# Patient Record
Sex: Male | Born: 1965
Health system: Southern US, Community
[De-identification: ages and names within clinical notes are randomized; demographics above are authoritative.]

## PROBLEM LIST (undated history)

## (undated) DIAGNOSIS — I255 Ischemic cardiomyopathy: Secondary | ICD-10-CM

## (undated) DIAGNOSIS — N529 Male erectile dysfunction, unspecified: Secondary | ICD-10-CM

## (undated) DIAGNOSIS — G629 Polyneuropathy, unspecified: Secondary | ICD-10-CM

## (undated) DIAGNOSIS — IMO0001 Reserved for inherently not codable concepts without codable children: Secondary | ICD-10-CM

## (undated) DIAGNOSIS — R519 Headache, unspecified: Secondary | ICD-10-CM

## (undated) DIAGNOSIS — I745 Embolism and thrombosis of iliac artery: Secondary | ICD-10-CM

## (undated) DIAGNOSIS — I219 Acute myocardial infarction, unspecified: Secondary | ICD-10-CM

## (undated) DIAGNOSIS — Z955 Presence of coronary angioplasty implant and graft: Secondary | ICD-10-CM

## (undated) DIAGNOSIS — E669 Obesity, unspecified: Secondary | ICD-10-CM

## (undated) DIAGNOSIS — R51 Headache: Secondary | ICD-10-CM

## (undated) DIAGNOSIS — E291 Testicular hypofunction: Secondary | ICD-10-CM

## (undated) DIAGNOSIS — E785 Hyperlipidemia, unspecified: Secondary | ICD-10-CM

## (undated) DIAGNOSIS — I739 Peripheral vascular disease, unspecified: Secondary | ICD-10-CM

## (undated) DIAGNOSIS — F17200 Nicotine dependence, unspecified, uncomplicated: Secondary | ICD-10-CM

## (undated) DIAGNOSIS — Z789 Other specified health status: Secondary | ICD-10-CM

## (undated) DIAGNOSIS — F329 Major depressive disorder, single episode, unspecified: Secondary | ICD-10-CM

## (undated) DIAGNOSIS — I701 Atherosclerosis of renal artery: Secondary | ICD-10-CM

## (undated) DIAGNOSIS — C61 Malignant neoplasm of prostate: Secondary | ICD-10-CM

## (undated) DIAGNOSIS — F32A Depression, unspecified: Secondary | ICD-10-CM

## (undated) DIAGNOSIS — I771 Stricture of artery: Secondary | ICD-10-CM

## (undated) DIAGNOSIS — K420 Umbilical hernia with obstruction, without gangrene: Secondary | ICD-10-CM

## (undated) DIAGNOSIS — I4891 Unspecified atrial fibrillation: Secondary | ICD-10-CM

## (undated) DIAGNOSIS — D72829 Elevated white blood cell count, unspecified: Secondary | ICD-10-CM

## (undated) DIAGNOSIS — F1290 Cannabis use, unspecified, uncomplicated: Secondary | ICD-10-CM

## (undated) DIAGNOSIS — N400 Enlarged prostate without lower urinary tract symptoms: Secondary | ICD-10-CM

## (undated) DIAGNOSIS — M545 Low back pain, unspecified: Secondary | ICD-10-CM

## (undated) DIAGNOSIS — Z7902 Long term (current) use of antithrombotics/antiplatelets: Secondary | ICD-10-CM

## (undated) DIAGNOSIS — R5383 Other fatigue: Secondary | ICD-10-CM

## (undated) DIAGNOSIS — I10 Essential (primary) hypertension: Secondary | ICD-10-CM

## (undated) DIAGNOSIS — A6000 Herpesviral infection of urogenital system, unspecified: Secondary | ICD-10-CM

## (undated) DIAGNOSIS — Z0282 Encounter for adoption services: Secondary | ICD-10-CM

## (undated) DIAGNOSIS — Z7901 Long term (current) use of anticoagulants: Secondary | ICD-10-CM

## (undated) DIAGNOSIS — K219 Gastro-esophageal reflux disease without esophagitis: Secondary | ICD-10-CM

## (undated) DIAGNOSIS — F419 Anxiety disorder, unspecified: Secondary | ICD-10-CM

## (undated) DIAGNOSIS — G8929 Other chronic pain: Secondary | ICD-10-CM

## (undated) DIAGNOSIS — G43909 Migraine, unspecified, not intractable, without status migrainosus: Secondary | ICD-10-CM

## (undated) DIAGNOSIS — I2510 Atherosclerotic heart disease of native coronary artery without angina pectoris: Secondary | ICD-10-CM

## (undated) DIAGNOSIS — R0602 Shortness of breath: Secondary | ICD-10-CM

## (undated) HISTORY — DX: Low back pain: M54.5

## (undated) HISTORY — PX: OTHER SURGICAL HISTORY: SHX169

## (undated) HISTORY — DX: Benign prostatic hyperplasia without lower urinary tract symptoms: N40.0

## (undated) HISTORY — DX: Malignant neoplasm of prostate: C61

## (undated) HISTORY — DX: Other fatigue: R53.83

## (undated) HISTORY — DX: Major depressive disorder, single episode, unspecified: F32.9

## (undated) HISTORY — DX: Unspecified atrial fibrillation: I48.91

## (undated) HISTORY — DX: Low back pain, unspecified: M54.50

## (undated) HISTORY — DX: Other chronic pain: G89.29

## (undated) HISTORY — DX: Headache: R51

## (undated) HISTORY — DX: Headache, unspecified: R51.9

## (undated) HISTORY — DX: Depression, unspecified: F32.A

## (undated) HISTORY — DX: Testicular hypofunction: E29.1

## (undated) HISTORY — DX: Atherosclerotic heart disease of native coronary artery without angina pectoris: I25.10

## (undated) HISTORY — DX: Herpesviral infection of urogenital system, unspecified: A60.00

## (undated) HISTORY — DX: Male erectile dysfunction, unspecified: N52.9

## (undated) HISTORY — DX: Encounter for adoption services: Z02.82

## (undated) HISTORY — DX: Hyperlipidemia, unspecified: E78.5

## (undated) HISTORY — DX: Anxiety disorder, unspecified: F41.9

## (undated) HISTORY — DX: Polyneuropathy, unspecified: G62.9

## (undated) HISTORY — DX: Obesity, unspecified: E66.9

## (undated) HISTORY — DX: Ischemic cardiomyopathy: I25.5

## (undated) HISTORY — DX: Shortness of breath: R06.02

## (undated) HISTORY — DX: Acute myocardial infarction, unspecified: I21.9

## (undated) HISTORY — DX: Other specified health status: Z78.9

## (undated) HISTORY — DX: Essential (primary) hypertension: I10

---

## 1983-10-14 HISTORY — PX: OTHER SURGICAL HISTORY: SHX169

## 1998-12-07 ENCOUNTER — Ambulatory Visit (HOSPITAL_COMMUNITY): Admission: RE | Admit: 1998-12-07 | Discharge: 1998-12-08 | Payer: Self-pay | Admitting: Neurosurgery

## 1998-12-07 ENCOUNTER — Encounter: Payer: Self-pay | Admitting: Neurosurgery

## 2005-02-17 ENCOUNTER — Ambulatory Visit: Payer: Self-pay | Admitting: Family Medicine

## 2006-02-26 ENCOUNTER — Ambulatory Visit: Payer: Self-pay | Admitting: Family Medicine

## 2006-11-13 ENCOUNTER — Ambulatory Visit: Payer: Self-pay | Admitting: Family Medicine

## 2006-11-17 ENCOUNTER — Encounter: Admission: RE | Admit: 2006-11-17 | Discharge: 2006-11-17 | Payer: Self-pay | Admitting: Family Medicine

## 2007-02-03 ENCOUNTER — Inpatient Hospital Stay (HOSPITAL_COMMUNITY): Admission: RE | Admit: 2007-02-03 | Discharge: 2007-02-06 | Payer: Self-pay | Admitting: Orthopaedic Surgery

## 2007-03-23 ENCOUNTER — Ambulatory Visit: Payer: Self-pay | Admitting: Family Medicine

## 2007-07-14 ENCOUNTER — Encounter: Payer: Self-pay | Admitting: Family Medicine

## 2007-08-06 ENCOUNTER — Encounter: Payer: Self-pay | Admitting: Family Medicine

## 2007-10-12 ENCOUNTER — Encounter: Payer: Self-pay | Admitting: Family Medicine

## 2008-01-10 ENCOUNTER — Encounter: Admission: RE | Admit: 2008-01-10 | Discharge: 2008-01-10 | Payer: Self-pay | Admitting: Orthopaedic Surgery

## 2008-01-27 ENCOUNTER — Encounter: Payer: Self-pay | Admitting: Family Medicine

## 2008-03-29 ENCOUNTER — Encounter: Payer: Self-pay | Admitting: Family Medicine

## 2008-06-01 ENCOUNTER — Telehealth (INDEPENDENT_AMBULATORY_CARE_PROVIDER_SITE_OTHER): Payer: Self-pay | Admitting: *Deleted

## 2008-06-05 ENCOUNTER — Telehealth: Payer: Self-pay | Admitting: Family Medicine

## 2008-06-05 DIAGNOSIS — G8929 Other chronic pain: Secondary | ICD-10-CM

## 2008-06-05 DIAGNOSIS — M549 Dorsalgia, unspecified: Secondary | ICD-10-CM

## 2008-06-29 ENCOUNTER — Ambulatory Visit: Payer: Self-pay | Admitting: Neurosurgery

## 2009-06-04 ENCOUNTER — Encounter: Payer: Self-pay | Admitting: Family Medicine

## 2009-10-13 DIAGNOSIS — I219 Acute myocardial infarction, unspecified: Secondary | ICD-10-CM

## 2009-10-13 HISTORY — DX: Acute myocardial infarction, unspecified: I21.9

## 2010-01-16 ENCOUNTER — Ambulatory Visit: Payer: Self-pay | Admitting: Family Medicine

## 2010-01-16 DIAGNOSIS — R079 Chest pain, unspecified: Secondary | ICD-10-CM | POA: Insufficient documentation

## 2010-01-25 ENCOUNTER — Ambulatory Visit: Payer: Self-pay | Admitting: Cardiology

## 2010-02-12 ENCOUNTER — Telehealth (INDEPENDENT_AMBULATORY_CARE_PROVIDER_SITE_OTHER): Payer: Self-pay | Admitting: *Deleted

## 2010-02-13 ENCOUNTER — Ambulatory Visit: Payer: Self-pay

## 2010-02-13 ENCOUNTER — Encounter (HOSPITAL_COMMUNITY): Admission: RE | Admit: 2010-02-13 | Discharge: 2010-04-12 | Payer: Self-pay | Admitting: Cardiovascular Disease

## 2010-02-13 ENCOUNTER — Ambulatory Visit: Payer: Self-pay | Admitting: Cardiovascular Disease

## 2010-04-18 ENCOUNTER — Telehealth (INDEPENDENT_AMBULATORY_CARE_PROVIDER_SITE_OTHER): Payer: Self-pay | Admitting: *Deleted

## 2010-11-10 LAB — CONVERTED CEMR LAB
ALT: 17 units/L (ref 0–53)
Bilirubin, Direct: 0.1 mg/dL (ref 0.0–0.3)
Indirect Bilirubin: 0.4 mg/dL (ref 0.0–0.9)
LDL Cholesterol: 140 mg/dL — ABNORMAL HIGH (ref 0–99)
Total Bilirubin: 0.5 mg/dL (ref 0.3–1.2)
Total CHOL/HDL Ratio: 7
VLDL: 22 mg/dL (ref 0–40)

## 2010-11-12 NOTE — Progress Notes (Signed)
  Request Recieved from [facility] sent to [facility]  April 18, 2010 11:53 AM

## 2010-11-12 NOTE — Assessment & Plan Note (Signed)
Summary: NEW PT   Referring Provider:  Dr. Dayton Martes Primary Provider:  Dr. Milinda Antis  CC:  Episode of Chest Pain during exertion last week .  History of Present Illness: 45 yo smoker with chronic back pain and sciatica presents for evaluation of chest pain.  Patient has had chest pain episodes for the last 3-4 months.  Episodes can last anywhere from a few minutes to all day (usually a few minutes).  They occur about twice a week.  They happen both at rest and with exertion.  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  They are not related to eating meals.  Pain is 6/10 at worst but usually 2-3/10.  He is a smoker, < 1 ppd.  He is not very active because of severe low back pain and left leg sciatica.  He is trying to get disability.  He cannot walk far.    ECG: NSR at 57, normal ECG  Current Medications (verified): 1)  Cymbalta 30 Mg Cpep (Duloxetine Hcl) .Marland Kitchen.. 1 Tab By Mouth Daily  Allergies: 1)  ! Codeine  Past History:  Past Medical History: 1. chest pain 2. anxiety 3. Chronic lower back pain and sciatica in left leg.  Patient has history of L5/S1 fusion in 2008.  He has limiting symptoms from his leg pain.  4. Depression 5. Smoking  Family History: Reviewed history from 01/22/2010 and no changes required. unknown- pt adopted  Social History: Tobacco Use - Yes, smokes < 1ppd since high school. Married, one son and a grandson live with him now.  Part time work with heating/AC   Review of Systems       All systems reviewed and negative except as per HPI.   Vital Signs:  Patient profile:   45 year old male Height:      71 inches Weight:      209 pounds BMI:     29.25 Pulse rate:   57 / minute BP sitting:   136 / 90  (left arm)  Vitals Entered By: Stanton Kidney, EMT-P (January 25, 2010 9:12 AM)  Physical Exam  General:  Well developed, well nourished, in no acute distress. Head:  normocephalic and atraumatic Nose:  no  deformity, discharge, inflammation, or lesions Mouth:  Teeth, gums and palate normal. Oral mucosa normal. Neck:  Neck supple, no JVD. No masses, thyromegaly or abnormal cervical nodes. Lungs:  Clear bilaterally to auscultation and percussion. Heart:  Non-displaced PMI, chest non-tender; regular rate and rhythm, S1, S2 without murmurs, rubs. +S4. Carotid upstroke normal, no bruit.  Pedals normal pulses. No edema, no varicosities. Abdomen:  Bowel sounds positive; abdomen soft and non-tender without masses, organomegaly, or hernias noted. No hepatosplenomegaly. Msk:  Back normal, normal gait. Muscle strength and tone normal. Extremities:  No clubbing or cyanosis. Neurologic:  Alert and oriented x 3. Skin:  Intact without lesions or rashes. Psych:  Normal affect.   Impression & Recommendations:  Problem # 1:  CHEST PAIN UNSPECIFIED (ICD-786.50) Chest pain with both typical and atypical features.  Normal ECG.  Risk factors are smoking and gender.  I agree with Dr. Dayton Martes that it would be reasonable to do a stress test, will need to be a [facility] as he will be unable to walk on the treadmill due to his sciatica.  I will check his lipids as he does not think this has been done in a long time.   Problem #  2:  SMOKING I strongly encouraged him to quit.  He tried wellbutrin in the past but it did not work.  He says he is not ready to try again.   Patient Instructions: 1)  Your physician has requested that you have a lexiscan myoview.  For further information please visit [email or web address]  Please follow instruction sheet, as given.  Appended Document: NEW PT    Clinical Lists Changes  Orders: Added new Test order of T-Lipid Profile [phone]) - Signed Added new Test order of T-Hepatic Function [phone]) - Signed      Appended Document: NEW PT Will need to see stress test to decide whether to treat with statin

## 2010-11-12 NOTE — Progress Notes (Signed)
Summary: PHI  PHI   Imported By: Harlon Flor 01/25/2010 15:43:35  _____________________________________________________________________  External Attachment:    Type:   Image     Comment:   External Document

## 2010-11-12 NOTE — Assessment & Plan Note (Signed)
Summary: chest pains on and off   Vital Signs:  Patient profile:   45 year old male Weight:      211 pounds Temp:     98.5 degrees F oral Pulse rate:   80 / minute Pulse rhythm:   regular BP sitting:   126 / 86  (left arm) Cuff size:   large  Vitals Entered By: Delilah Shan CMA Duncan Dull) (January 16, 2010 12:20 PM) CC: Chest pains off and on   History of Present Illness: 45 yo pt new to me with h/o chest pain.  Chest pain- past 6 months, left sided chest pain that comes and goes. He is unsure it is worsened by exertion, cannot truly exert himself due to his chronic back pain. 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  Not alleviated by rest. No radiation of symptoms.  No associated diaphoresis, nausea or vomiting.  Does not know his family history was adopted. He is a smoker. No known personal history of HLD or DM although no recent lab work.  Has a lot of anxiety, he is attempting to get disabiltiy. Chronic pain, left lower extremity neuropathic pain s/p lumbar surgery. Has tried Lyrica, multiple different narcotics, neurontin.  nothing helps. Tearful most of the time.  No SI or HI because he knows he just lukcy to be alive.  Current Medications (verified): 1)  Vicodin 5-500 Mg Tabs (Hydrocodone-Acetaminophen) .... ? Dosage/ On Muscle Relaxer Also Does Not Know Name 2)  Cymbalta 30 Mg Cpep (Duloxetine Hcl) .Marland Kitchen.. 1 Tab By Mouth Daily  Allergies: 1)  ! Codeine  Review of Systems      See HPI General:  Denies fatigue and fever. CV:  Complains of chest pain or discomfort and fainting; denies leg cramps with exertion, near fainting, palpitations, and shortness of breath with exertion. Resp:  Denies shortness of breath. GI:  Denies abdominal pain. Psych:  Complains of anxiety, depression, and easily tearful; denies easily angered, sense of great danger, suicidal thoughts/plans, thoughts of violence, unusual  visions or sounds, and thoughts /plans of harming others.  Physical Exam  General:  Well-developed,well-nourished,in no acute distress; alert,appropriate and cooperative throughout examination Mouth:  Oral mucosa and oropharynx without lesions or exudates.  Teeth in good repair. Neck:  No deformities, masses, or tenderness noted. Lungs:  Normal respiratory effort, chest expands symmetrically. Lungs are clear to auscultation, no crackles or wheezes. Heart:  Normal rate and regular rhythm. S1 and S2 normal without gallop, murmur, click, rub or other extra sounds. Chest wall NTTP. Extremities:  No clubbing, cyanosis, edema, or deformity noted with normal full range of motion of all joints.   Psych:  memory intact for recent and remote, normally interactive, and good eye contact.     Impression & Recommendations:  Problem # 1:  CHEST PAIN UNSPECIFIED (ICD-786.50) Assessment New Unlikely cardiac, EKG normal.  Most likely anxiety related. However, given that he does not know is familiy history and he is unsure if is worsened with exertion and that it has continued for several months, I do feel it is warranted to stress him.  Cannot handle treadmill, likely needs myoview/adenosine.  Will refer to cards. Orders: EKG w/ Interpretation (93000) Cardiology Referral (Cardiology)  Problem # 2:  BACK PAIN, CHRONIC (ICD-724.5) Assessment: Deteriorated with understandibly worsening anxiety. Time spent with patient 45 minutes, more than 50% of this time was spent counseling patient on anxiety and pain management.  Given samples of Cymbalta along with pt info handout from up to date. Follow up with me in one month.  His updated medication list for this problem includes:    Vicodin 5-500 Mg Tabs (Hydrocodone-acetaminophen) ..... ? dosage/ on muscle relaxer also does not know name  Complete Medication List: 1)  Vicodin 5-500 Mg Tabs (Hydrocodone-acetaminophen) .... ? dosage/ on muscle relaxer also does  not know name 2)  Cymbalta 30 Mg Cpep (Duloxetine hcl) .Marland Kitchen.. 1 tab by mouth daily  Patient Instructions: 1)  Great to meet you. 2)  Please stop by to see Shirlee Limerick on your way out to set up your cardiology referral. 3)  Try Cymbalta 30 mg daily and come see me in one month.  Current Allergies (reviewed today): ! CODEINE

## 2010-11-12 NOTE — Assessment & Plan Note (Signed)
Summary: Cardiology Nuclear Study  Nuclear Med Background Indications for Stress Test: Evaluation for Ischemia    History Comments: NO DOCUMENTED CAD  Symptoms: Chest Pain, Chest Pain with Exertion, Dizziness, DOE, Fatigue, Light-Headedness, Near Syncope  Symptoms Comments: Last episode of CP:now, 2/10.   Nuclear Pre-Procedure Cardiac Risk Factors: Lipids, Smoker Caffeine/Decaff Intake: None NPO After: 10:30 PM Lungs: Clear.  O2 Sat 96% on RA. IV 0.9% NS with Angio Cath: 20g     IV Site: (R) AC IV Started by: Irean Hong RN Chest Size (in) 42     Height (in): 71 Weight (lb): 203 BMI: 28.42 Tech Comments: Patient adopted.  Nuclear Med Study 1 or 2 day study:  1 day     Stress Test Type:  Eugenie Birks Reading MD:  Charlton Haws, MD     Referring MD:  Marca Ancona, MD Resting Radionuclide:  Technetium 67m Tetrofosmin     Resting Radionuclide Dose:  11 mCi  Stress Radionuclide:  Technetium 72m Tetrofosmin     Stress Radionuclide Dose:  33 mCi   Stress Protocol   Lexiscan: 0.4 mg   Stress Test Technologist:  Rea [employer] CMA-N     Nuclear Technologist:  Domenic Polite CNMT  Rest Procedure  Myocardial perfusion imaging was performed at rest 45 minutes following the intravenous administration of Myoview Technetium 45m Tetrofosmin.  Stress Procedure  The patient received IV Lexiscan 0.4 mg over 15-seconds.  Myoview injected at 30-seconds.  There were no significant changes with lexiscan.  Quantitative spect images were obtained after a 45 minute delay.  QPS Raw Data Images:  Normal; no motion artifact; normal heart/lung ratio. Stress Images:  NI: Uniform and normal uptake of tracer in all myocardial segments. Rest Images:  Normal homogeneous uptake in all areas of the myocardium. Subtraction (SDS):  Normal Transient Ischemic Dilatation:  1.04  (Normal <1.22)  Lung/Heart Ratio:  .35  (Normal <0.45)  Quantitative Gated Spect Images QGS EDV:  108 ml QGS ESV:  47 ml QGS EF:   56 % QGS cine images:  normal  Findings Normal nuclear study      Overall Impression  Exercise Capacity: Lexiscan BP Response: Normal blood pressure response. Clinical Symptoms: No chest pain ECG Impression: No significant ST segment change suggestive of ischemia. Overall Impression: Normal stress nuclear study.  Appended Document: Cardiology Nuclear Study normal study.   Appended Document: Cardiology Nuclear Study Spoke with pt advised of results.  EWJ

## 2010-11-12 NOTE — Progress Notes (Signed)
Summary: Nuclear Pre-Procedure  Phone Note Outgoing Call   Call placed by: Milana Na, EMT-P,  Feb 12, 2010 2:52 PM Summary of Call: Left message with information on Myoview Information Sheet (see scanned document for details).      Nuclear Med Background Indications for Stress Test: Evaluation for Ischemia     Symptoms: Chest Pain, Chest Pain with Exertion    Nuclear Pre-Procedure Cardiac Risk Factors: Lipids, Smoker Height (in): 71  Nuclear Med Study Referring MD:  D.McLean

## 2011-01-22 ENCOUNTER — Inpatient Hospital Stay (HOSPITAL_COMMUNITY)
Admission: EM | Admit: 2011-01-22 | Discharge: 2011-01-24 | DRG: 247 | Disposition: A | Discharge status: Home / Self Care | Payer: Medicare Other | Source: Ambulatory Visit | Attending: Cardiovascular Disease | Admitting: Cardiovascular Disease

## 2011-01-22 DIAGNOSIS — I2119 ST elevation (STEMI) myocardial infarction involving other coronary artery of inferior wall: Secondary | ICD-10-CM

## 2011-01-22 DIAGNOSIS — R079 Chest pain, unspecified: Secondary | ICD-10-CM

## 2011-01-22 DIAGNOSIS — M545 Low back pain, unspecified: Secondary | ICD-10-CM | POA: Diagnosis present

## 2011-01-22 DIAGNOSIS — Z7982 Long term (current) use of aspirin: Secondary | ICD-10-CM

## 2011-01-22 DIAGNOSIS — I10 Essential (primary) hypertension: Secondary | ICD-10-CM | POA: Diagnosis present

## 2011-01-22 DIAGNOSIS — Z7902 Long term (current) use of antithrombotics/antiplatelets: Secondary | ICD-10-CM

## 2011-01-22 DIAGNOSIS — Z981 Arthrodesis status: Secondary | ICD-10-CM

## 2011-01-22 DIAGNOSIS — E119 Type 2 diabetes mellitus without complications: Secondary | ICD-10-CM | POA: Diagnosis present

## 2011-01-22 DIAGNOSIS — I255 Ischemic cardiomyopathy: Secondary | ICD-10-CM

## 2011-01-22 DIAGNOSIS — F17200 Nicotine dependence, unspecified, uncomplicated: Secondary | ICD-10-CM | POA: Diagnosis present

## 2011-01-22 DIAGNOSIS — E785 Hyperlipidemia, unspecified: Secondary | ICD-10-CM | POA: Diagnosis present

## 2011-01-22 DIAGNOSIS — I2510 Atherosclerotic heart disease of native coronary artery without angina pectoris: Secondary | ICD-10-CM | POA: Diagnosis present

## 2011-01-22 DIAGNOSIS — G8929 Other chronic pain: Secondary | ICD-10-CM | POA: Diagnosis present

## 2011-01-22 DIAGNOSIS — I2589 Other forms of chronic ischemic heart disease: Secondary | ICD-10-CM | POA: Diagnosis present

## 2011-01-22 HISTORY — PX: CORONARY ANGIOPLASTY: SHX604

## 2011-01-22 HISTORY — PX: CORONARY THROMBECTOMY: CATH118304

## 2011-01-22 HISTORY — PX: CORONARY ANGIOPLASTY WITH STENT PLACEMENT: SHX49

## 2011-01-22 HISTORY — DX: ST elevation (STEMI) myocardial infarction involving other coronary artery of inferior wall: I21.19

## 2011-01-22 HISTORY — DX: Ischemic cardiomyopathy: I25.5

## 2011-01-22 LAB — COMPREHENSIVE METABOLIC PANEL
ALT: 24 U/L (ref 0–53)
AST: 21 U/L (ref 0–37)
CO2: 20 mEq/L (ref 19–32)
Chloride: 108 mEq/L (ref 96–112)
Creatinine, Ser: 1.12 mg/dL (ref 0.4–1.5)
GFR calc Af Amer: >60 mL/min (ref >60)
GFR calc non Af Amer: >60 mL/min (ref >60)
Glucose, Bld: 152 mg/dL — ABNORMAL HIGH (ref 70–99)
Total Bilirubin: 0.5 mg/dL (ref 0.3–1.2)

## 2011-01-22 LAB — CBC
HCT: 42.1 % (ref 39.0–52.0)
Hemoglobin: 15.1 g/dL (ref 13.0–17.0)
MCH: 30.9 pg (ref 26.0–34.0)
RBC: 4.89 MIL/uL (ref 4.22–5.81)

## 2011-01-22 LAB — LIPID PANEL
Cholesterol: 165 mg/dL (ref 0–200)
LDL Cholesterol: 115 mg/dL — ABNORMAL HIGH (ref 0–99)
Total CHOL/HDL Ratio: 7.5 RATIO

## 2011-01-22 LAB — PROTIME-INR: INR: 1.07 (ref 0.00–1.49)

## 2011-01-22 LAB — CARDIAC PANEL(CRET KIN+CKTOT+MB+TROPI)
CK, MB: 2.4 ng/mL (ref 0.3–4.0)
Total CK: 88 U/L (ref 7–232)

## 2011-01-22 LAB — POCT I-STAT, CHEM 8
BUN: 11 mg/dL (ref 6–23)
Chloride: 105 mEq/L (ref 96–112)
Creatinine, Ser: 1.2 mg/dL (ref 0.4–1.5)
Glucose, Bld: 156 mg/dL — ABNORMAL HIGH (ref 70–99)
Hemoglobin: 15.3 g/dL (ref 13.0–17.0)
Potassium: 3.4 mEq/L — ABNORMAL LOW (ref 3.5–5.1)
Sodium: 141 mEq/L (ref 135–145)

## 2011-01-22 LAB — MRSA PCR SCREENING: MRSA by PCR: NEGATIVE

## 2011-01-22 LAB — POCT ACTIVATED CLOTTING TIME: Activated Clotting Time: 205 seconds

## 2011-01-22 LAB — HEMOGLOBIN A1C: Mean Plasma Glucose: 114 mg/dL (ref <117)

## 2011-01-22 LAB — APTT: aPTT: >200 seconds (ref 24–37)

## 2011-01-23 DIAGNOSIS — I2119 ST elevation (STEMI) myocardial infarction involving other coronary artery of inferior wall: Secondary | ICD-10-CM

## 2011-01-23 DIAGNOSIS — I219 Acute myocardial infarction, unspecified: Secondary | ICD-10-CM

## 2011-01-23 LAB — BASIC METABOLIC PANEL
BUN: 7 mg/dL (ref 6–23)
Calcium: 8.7 mg/dL (ref 8.4–10.5)
GFR calc non Af Amer: >60 mL/min (ref >60)
Glucose, Bld: 110 mg/dL — ABNORMAL HIGH (ref 70–99)
Potassium: 4.1 mEq/L (ref 3.5–5.1)

## 2011-01-23 LAB — CBC
HCT: 43.2 % (ref 39.0–52.0)
MCHC: 34.7 g/dL (ref 30.0–36.0)
MCV: 87.3 fL (ref 78.0–100.0)
Platelets: 255 10*3/uL (ref 150–400)
RDW: 13.5 % (ref 11.5–15.5)
WBC: 16.8 10*3/uL — ABNORMAL HIGH (ref 4.0–10.5)

## 2011-01-23 LAB — HEMOGLOBIN A1C
Hgb A1c MFr Bld: 5.5 % (ref <5.7)
Mean Plasma Glucose: 111 mg/dL (ref <117)

## 2011-01-23 LAB — LIPID PANEL: Cholesterol: 161 mg/dL (ref 0–200)

## 2011-01-24 LAB — CBC
HCT: 45.6 % (ref 39.0–52.0)
Hemoglobin: 15.9 g/dL (ref 13.0–17.0)
RBC: 5.19 MIL/uL (ref 4.22–5.81)
WBC: 15.9 10*3/uL — ABNORMAL HIGH (ref 4.0–10.5)

## 2011-01-24 LAB — BASIC METABOLIC PANEL
CO2: 29 mEq/L (ref 19–32)
Calcium: 9.1 mg/dL (ref 8.4–10.5)
Chloride: 104 mEq/L (ref 96–112)
GFR calc Af Amer: >60 mL/min (ref >60)
Glucose, Bld: 97 mg/dL (ref 70–99)
Potassium: 4.4 mEq/L (ref 3.5–5.1)
Sodium: 140 mEq/L (ref 135–145)

## 2011-01-27 ENCOUNTER — Telehealth: Payer: Self-pay | Admitting: Cardiovascular Disease

## 2011-01-27 NOTE — Discharge Summary (Addendum)
**Note Charles via Obfuscation** [name], [name]               ACCOUNT NO.:  [id number]  MEDICAL RECORD NO.:  [id number]           PATIENT TYPE:  I  LOCATION:  2031                         FACILITY:  [facility]  PHYSICIAN:  Marca Ancona, MD      DATE OF BIRTH:  [birth year]  DATE OF ADMISSION:  01/22/2011 DATE OF DISCHARGE:  01/24/2011                              DISCHARGE SUMMARY   DISCHARGE DIAGNOSES: 1. Acute inferior ST-elevation secondary to plaque rupture and large     thrombus in the mid circumflex, status post angioplasty     thrombectomy and Promus drug-eluting stent placement to this     vessel. 2. Recent residual significant disease in the first obtuse marginal     artery and mild to moderate disease in the left anterior     descending, for medical therapy. 3. Ischemic cardiomyopathy with ejection fraction of 40% by echo on     January 23, 2011.     a.     Ejection fraction was 50%, estimated by cath on January 22, 2011. 4. Neuropathy. 5. Chronic low back pain, status post L5-S1 fusion in 2008. 6. Hypertension. 7. Dyslipidemia with a total cholesterol of 161, triglycerides 105,     HDL 20, and LDL 120. 8. Reported history of diabetes mellitus, although A1c is 5.5, not on     any agents.  HOSPITAL COURSE:  [name] is a 45 year old gentleman with a history of hypertension and hyperlipidemia as well as tobacco abuse who was playing Xbox with acute onset of right arm pain traveling across the chest and into his left arm, and 10 associated with shortness of breath.  He called his wife who asked to him to call EMS.  EKG was obtained which showed inferior ST-elevation MI and code STEMI was activated.  The patient received sublingual nitro as well as 325 mg of aspirin en route to the Cath Lab.  Upon arrival, his chest pain was 1/10.  EKG showed 1-2 mm ST-elevation in lead II, III, and aVF with reciprocal ST depression in I, aVL, V1, and V2.  Dr. Kirke Corin was present to receive him in the Cath Lab.  He  had plaque rupture and large thrombus in the mid circumflex which was felt to be the culprit vessel, treated with successful angioplasty, thrombectomy, and drug-eluting stent placement with 3.0 x 20-mm Promus Element stent.  His EF was estimated at 50%.  He had significant disease in the ostial OM-1 and some mild to moderate disease in the LAD for which Dr. Kirke Corin recommended aggressive medical therapy as well as dual antiplatelet therapy for at least 12 months.  He stated in his report that angioplasty of OM-1 can be considered and performed in the future if there are any residual anginal symptoms.  The territory is overall medium sized.  The lesion is ostial and will have to be stented back the left main.  The patient had a 2-D echocardiogram done the following day, January 23, 2011, showing an EF of 40% with posterior akinesis and inferior severe hypokinesis.  There was normal RV size and systolic function  with no significant valvular dysfunction.  The patient tolerated the procedure well.  He was started on a medication regimen for both his coronary artery disease and his LV dysfunction, and titrated throughout his hospital stay.  The resultant medications are listed below.  He also ambulated with cardiac rehab.  Dr. Shirlee Latch has seen and examined him today and feels he is stable for discharge.  DISCHARGE LABORATORY DATA:  WBC 15.9, hematocrit 45.6, hemoglobin 15.9, and platelet count 252.  Sodium 140, potassium 4.4, chloride 104, CO2 of 29, glucose 97, BUN 5, and creatinine 1.28.  LFTs were normal at on January 22, 2011.  A1c 5.5.  First set of cardiac enzymes showed negative CK-MB and CK and troponin of 0.10.  No subsequent sets were ordered. Total cholesterol 161, triglycerides 105, HDL 20, and LDL 120.  STUDIES:  Cardiac catheterization on January 22, 2011, please see full report for details as well as HPI for summary.  DISCHARGE MEDICATIONS:  Note that these were all new to the patient  and he is not on any medicines prior to admission. 1. Aspirin 81 mg daily. 2. Coreg 3.125 mg b.i.d. 3. Lipitor 80 mg nightly. 4. Lisinopril 2.5 mg daily. 5. Nicotine patch 21 mg patch 1 daily for 6 weeks, 14 mg patch 1 daily     for 2 weeks, and 7 mg patch 1 daily for 2 weeks. 6. Nitroglycerin sublingual 0.4 mg every 5 minutes as needed up to 3     doses. 7. Spironolactone 25 mg 1/2 tablet daily. 8. Prasugrel 10 mg daily.  DISPOSITION:  [name] will be discharged in stable condition to home. He is instructed to increase his activity slowly and not to lift anything for 1 week or participate in sexual activity for 1 week.  He is to follow a low-salt, heart-healthy diet and to call or return if he notices any pain, swelling, bleeding, or pus at the cath site.  He will follow up with Dr. Shirlee Latch in [place] as an outpatient. Unfortunately, he is not back in [place] until Feb 20, 2011, so I am asking him to clarify whether or not he wants to see the patient in [place] or have the patient see him in the office in the meantime for first followup visit.  He will get a BMET in approximately 10 days given initiation of these medicines as well.  DURATION OF DISCHARGE ENCOUNTER:  Greater than 30 minutes including physician and PA time.     Ronie Spies, P.A.C.   ______________________________ Marca Ancona, MD    DD/MEDQ  D:  01/24/2011  T:  01/24/2011  Job:  [id number]  Electronically Signed by Marca Ancona MD on 01/27/2011 09:14:39 AM Electronically Signed by Ronie Spies  on 01/29/2011 11:22:44 AM

## 2011-01-27 NOTE — Telephone Encounter (Signed)
Patient went to [facility] on Wed presenting with heart attack.  During hospital stay was given meds for nausea, but now that he is home doesn't have anything and feels the nausea is worse.  Due to see Dr. Mariah Milling for f/u April 27th but feels he needs something before then to help.

## 2011-01-27 NOTE — Telephone Encounter (Signed)
Spoke to pt, he states his nausea has subsided. Notified pt if he was not on Lipitor at all and is now taking 80 mg it is likely causing his nausea. Advised pt to cut dose in half 40mg , and take right before he goes to bed, with food (per Dr. Mariah Milling). Told pt to try this and if he continues to have problems to call our office. Pt ok with this.

## 2011-02-03 ENCOUNTER — Other Ambulatory Visit (INDEPENDENT_AMBULATORY_CARE_PROVIDER_SITE_OTHER): Payer: Medicare Other | Admitting: *Deleted

## 2011-02-03 DIAGNOSIS — Z0000 Encounter for general adult medical examination without abnormal findings: Secondary | ICD-10-CM

## 2011-02-04 LAB — BASIC METABOLIC PANEL
BUN: 10 mg/dL (ref 6–23)
CO2: 23 mEq/L (ref 19–32)
Chloride: 105 mEq/L (ref 96–112)
Glucose, Bld: 105 mg/dL — ABNORMAL HIGH (ref 70–99)
Potassium: 4.4 mEq/L (ref 3.5–5.3)

## 2011-02-06 NOTE — Progress Notes (Signed)
Notified patient of lab results.  Patient has a follow up on Friday February 07, 2011.

## 2011-02-07 ENCOUNTER — Ambulatory Visit (INDEPENDENT_AMBULATORY_CARE_PROVIDER_SITE_OTHER): Payer: Medicare Other | Admitting: Cardiovascular Disease

## 2011-02-07 ENCOUNTER — Encounter: Payer: Self-pay | Admitting: Cardiovascular Disease

## 2011-02-07 DIAGNOSIS — F17200 Nicotine dependence, unspecified, uncomplicated: Secondary | ICD-10-CM | POA: Insufficient documentation

## 2011-02-07 DIAGNOSIS — M549 Dorsalgia, unspecified: Secondary | ICD-10-CM

## 2011-02-07 DIAGNOSIS — Z9861 Coronary angioplasty status: Secondary | ICD-10-CM

## 2011-02-07 DIAGNOSIS — I2510 Atherosclerotic heart disease of native coronary artery without angina pectoris: Secondary | ICD-10-CM | POA: Insufficient documentation

## 2011-02-07 DIAGNOSIS — E785 Hyperlipidemia, unspecified: Secondary | ICD-10-CM

## 2011-02-07 DIAGNOSIS — E782 Mixed hyperlipidemia: Secondary | ICD-10-CM | POA: Insufficient documentation

## 2011-02-07 DIAGNOSIS — Z955 Presence of coronary angioplasty implant and graft: Secondary | ICD-10-CM | POA: Insufficient documentation

## 2011-02-07 MED ORDER — PRASUGREL HCL 10 MG PO TABS: 10.0000 mg | ORAL_TABLET | Freq: Every day | ORAL | Status: DC

## 2011-02-07 NOTE — Assessment & Plan Note (Signed)
He reports being on disability for the past 3-1/2 years for his back. We have encouraged him to try different ways of exercising such as recumbent bike or swimming.

## 2011-02-07 NOTE — Progress Notes (Signed)
   Patient ID: Charles Holder, male    DOB: 07-05-1966, 45 y.o.   MRN: [id number]  HPI Comments: 45 year old gentleman with long history of smoking, hypertension, hyperlipidemia, on long-term disability for noncardiac-related issues presented with inferior STEMI, with occlusion and thrombus to the mid circumflex, Status post 3.0 x 20 mm drug-eluting stent also with residual significant ostial OM1 disease, mild to moderate LAD disease who presents to establish care. Ejection fraction estimated at 40% by echocardiogram on April 12, 50% by cardiac catheterization.  He reports that he has had no further episodes of chest pain. He does have fatigue though this was there prior to his heart attack. He's been taking his medications as prescribed. He continues to smoke several cigarettes per day. He is concerned about the residual OM one disease and was told by one physician that he needed a stent though told by a second physician that he did not need intervention. Again he does not have symptoms of chest pain or angina or shortness of breath. He is not very active at baseline secondary to his underlying back and neurologic issues. He does not do regular exercise program.  EKG shows sinus bradycardia with rate of 52 beats per minute, ST and T wave abnormality in II, III, aVF     Review of Systems  Constitutional: Positive for fatigue.  HENT: Negative.   Eyes: Negative.   Respiratory: Negative.   Cardiovascular: Negative.   Gastrointestinal: Negative.   Musculoskeletal: Negative.   Skin: Negative.   Neurological: Negative.   Hematological: Negative.   Psychiatric/Behavioral: Negative.   All other systems reviewed and are negative.   BP 118/68  Pulse 52  Ht 5\' 10"  (1.778 m)  Wt 201 lb 12.8 oz (91.536 kg)  BMI 28.96 kg/m2   Physical Exam  Nursing note and vitals reviewed. Constitutional: He is oriented to person, place, and time. He appears well-developed and well-nourished.  HENT:  Head:  Normocephalic.  Nose: Nose normal.  Mouth/Throat: Oropharynx is clear and moist.  Eyes: Conjunctivae are normal. Pupils are equal, round, and reactive to light.  Neck: Normal range of motion. Neck supple. No JVD present.  Cardiovascular: Normal rate, regular rhythm, S1 normal, S2 normal, normal heart sounds and intact distal pulses.  Exam reveals no gallop and no friction rub.   No murmur heard. Pulmonary/Chest: Effort normal and breath sounds normal. No respiratory distress. He has no wheezes. He has no rales. He exhibits no tenderness.  Abdominal: Soft. Bowel sounds are normal. He exhibits no distension. There is no tenderness.  Musculoskeletal: Normal range of motion. He exhibits no edema and no tenderness.  Lymphadenopathy:    He has no cervical adenopathy.  Neurological: He is alert and oriented to person, place, and time. Coordination normal.  Skin: Skin is warm and dry. No rash noted. No erythema.  Psychiatric: He has a normal mood and affect. His behavior is normal. Judgment and thought content normal.           Assessment and Plan

## 2011-02-07 NOTE — Assessment & Plan Note (Signed)
Recent DES stent placed to his mid left circumflex. Currently with no symptoms of angina. He is inactive at baseline with no exercise program. We have encouraged him to increase his biking or swimming as he is unable to walk far because of his back.

## 2011-02-07 NOTE — Assessment & Plan Note (Signed)
He did have nausea on the full dose Lipitor. He is doing well on Lipitor 40 mg daily

## 2011-02-07 NOTE — Assessment & Plan Note (Addendum)
He does have residual OM1 disease. Also with 30% ostial LAD, 40-50% mid LAD disease. We'll look at the angiogram pictures. Given that he does not have any symptoms, I would be inclined to treat him medically at this time. We have talked to him about his continued smoking.

## 2011-02-07 NOTE — Assessment & Plan Note (Signed)
We spent significant time talking about his smoking. He is at high risk of stent thrombosis and progression of other disease if he continues to smoke. He will try an electronic cigarette

## 2011-02-07 NOTE — Patient Instructions (Signed)
You are doing well. No medication changes were made. Please call us if you have new issues that need to be addressed before your next appt.  We will call you for a follow up Appt. In 6 months  

## 2011-02-12 NOTE — Cardiovascular Report (Signed)
NAME:  DREYDEN, ROHRMAN               ACCOUNT NO.:  [id number]  MEDICAL RECORD NO.:  [id number]           PATIENT TYPE:  I  LOCATION:  2920                         FACILITY:  [facility]  PHYSICIAN:  Lorine Bears, MD     DATE OF BIRTH:  1966/09/18  DATE OF PROCEDURE:  01/22/2011 DATE OF DISCHARGE:                           CARDIAC CATHETERIZATION   PRIMARY CARDIOLOGIST:  Marca Ancona, MD  PROCEDURES PERFORMED: 1. Emergent cardiac catheterization. 2. Coronary angiography. 3. Left ventricular angiography. 4. Angioplasty, thrombectomy, and drug-eluting stent placement to the     midcircumflex.  INDICATION AND CLINICAL HISTORY:  [name] is a 45 year old gentleman with no previous cardiac history.  He has risk factors that include tobacco use.  He had presented with acute onset of chest and shoulder discomfort.  EMS were called.  ECG showed significant inferior ST elevation.  He was transferred emergently to the cath lab for cardiac catheterization.  Risks, benefits, and alternatives were discussed with the patient.  STUDY DETAILS:  A standard informed consent was obtained, but it was emergent.  He was given fentanyl and Versed for sedation.  The right femoral area was prepped in a sterile fashion.  It was anesthetized with 1% lidocaine.  A 6-French sheath was placed in the right femoral artery after an anterior puncture.  Coronary angiography was performed with a JL-3.5.  Given that the culprit was the left circumflex and was dominant, I decided to proceed with placing of the XB 3.5 guide with no side holes.  The lesion in the midcircumflex was wired easily with a Prowater wire.  I performed thrombectomy with an Export catheter.  Small amount of thrombus was retrieved, but large thrombus was still there.  I performed balloon angioplasty with a 2.5 x 15-mm apex balloon to 10 atmospheres.  I then placed 3.0 x 20-mm PROMUS Element stent and deployed it to 16 atmospheres.  This was  postdilated with a 3.25 x 15-mm [place] Dezza Trek to 12 atmospheres distally and 14 atmospheres proximally. Angiography showed excellent results.  Right coronary angiography was then performed with a right Judkins catheter.  Left ventricular angiography was performed with a pigtail catheter.  All catheter exchanges were done over the wire.  In terms of medications, he was given 4000 units of heparin at the beginning of the case before obtaining access.  After that he was started on IV bivalirudin.  He was already given aspirin and was loaded with Effient 60 mg x1.  STUDY FNDINGS:  Hemodynamic findings:  Left ventricular pressure was 125/2 with a left ventricular end-diastolic pressure of 4 mmHg.  Aortic pressure is 130/70 with a mean pressure of 94 mmHg.  No significant gradient was noted across the aortic valve.  Left ventricular angiography:  This showed borderline low normal LV systolic function with an ejection fraction of 50%.  There was moderate basal inferior wall hypokinesis.  CORONARY ANGIOGRAPHY:  Right coronary artery:  The vessel was small in size and nondominant.  It has 50% disease distally.  Left main coronary artery:  The vessel was normal in size and free of significant disease.  Left circumflex artery:  The vessel was large size and dominant.  Plaque rupture was noted in the mid segment with a large thrombus, but with TIMI 3 flow.  OM-1 is a normal-sized vessel with a 70% ostial and proximal disease followed by 30% disease in the mid segment.  The area that supplied the OM branch is medium sized.  In the mid segment before the culprit lesion, there is a 30% lesion.  The rest of the circumflex is free of significant disease.  Left anterior descending artery:  The vessel was normal in size.  It has diffuse atherosclerosis throughout its course.  30% ostial lesion is noted.  In the mid segment, there is 40% to 50% discrete lesion.  In the distal LAD, there is a 30% lesion.   Diagonals are overall free of significant disease.  CONCLUSIONS: 1. Acute inferior ST elevation myocardial infarction due to plaque     rupture and a large thrombus in the midcircumflex. 2. Successful angioplasty, thrombectomy, and a drug-eluting stent     placement to the midcircumflex with a 3.0 x 20-mm PROMUS Element     stent. 3. Low normal LV systolic function with his ejection fraction of 50%     and normal left ventricular end-diastolic pressure. 4. Significant disease in ostial OM-1 and mild-to-moderate disease in     the left anterior descending artery.  RECOMMENDATIONS: 1. Recommend aggressive medical therapy. 2. Dual antiplatelet therapy for at least 12 months. 3. Smoking cessation is strongly advised. 4. Angioplasty of OM-1 can be considered and performed in the future     if there are any residual anginal symptoms.  The territory is     overall medium sized.  The lesion is ostial and will have to be     stented back to the left main.     Lorine Bears, MD     MA/MEDQ  D:  01/22/2011  T:  01/23/2011  Job:  [id number]  cc:   Marca Ancona, MD  Electronically Signed by Lorine Bears MD on 02/12/2011 03:01:17 PM

## 2011-02-12 NOTE — H&P (Signed)
NAME:  Charles Holder, Charles Holder               ACCOUNT NO.:  [id number]  MEDICAL RECORD NO.:  [id number]           PATIENT TYPE:  I  LOCATION:  2920                         FACILITY:  [facility]  PHYSICIAN:  Lorine Bears, MD     DATE OF BIRTH:  06-14-1966  DATE OF ADMISSION:  01/22/2011 DATE OF DISCHARGE:                             HISTORY & PHYSICAL   PRIMARY CARDIOLOGIST:  Marca Ancona, MD  PRIMARY CARE PROVIDER:  Ruthe Mannan, MD  REASON FOR ADMISSION:  Chest pain/code STEMI.  HISTORY OF PRESENT ILLNESS:  This is a 45 year old Caucasian gentleman without known coronary artery disease but history of tobacco abuse.  The patient was evaluated by Dr. Shirlee Latch approximately 1 year ago for atypical chest pain.  He underwent stress testing in May 2011 that showed a normal exam.  The patient states he was doing well besides his neuropathy until today while he playing Xbox.  He had the acute onset of right arm pain.  The pain traveled across his chest and into his left arm.  This pain was extremely intense, approximately 7 or so out of 10. He said he has associated shortness of breath.  He called his wife and who asked him to call EMS.  When EMS arrived, the patient was given a nitroglycerin with some relief.  An EKG was obtained that showed an inferior ST-elevation myocardial infarction.  A code STEMI was initiated.  The patient was given another sublingual nitroglycerin as well as 325 mg of aspirin en route to the cath lab at [facility].  Upon evaluation in the cath lab, the patient's chest pain was approximately 1/10.  He denies having this pain before.  He does state that he continues to use tobacco, approximately one pack a day.  PAST MEDICAL HISTORY: 1. Neuropathy. 2. Chronic low back pain status post L5/S1 fusion in 2008. 3. Hypertension. 4. Hyperlipidemia. 5. Diabetes mellitus.  SOCIAL HISTORY:  The patient lives with his wife.  He is unemployed.  He has a 40 pack-year smoking history and  continues to smoke about a pack a day.  FAMILY HISTORY:  Unable to be obtained secondary to the patient is adopted.  ALLERGIES:  CODEINE.  HOME MEDICATIONS:  None.  REVIEW OF SYSTEMS:  Pertinent positives as stated in HPI.  Review of systems have been shortened secondary to emergent situation.  PHYSICAL EXAMINATION:  VITAL SIGNS:  Pulse 75, blood pressure 127/89, O2 saturation 99% on 2 L. GENERAL:  Well-developed, well-nourished middle-aged gentleman.  He is no acute distress. HEENT:  Normal. NECK:  Supple. HEART:  Regular rate and rhythm with S1 and S2.  No murmur, rub, or gallop noted.  PMI is normal.  Pulses are 2+ and equal bilaterally. LUNGS:  Clear to auscultation anteriorly.  No wheezes, rhonchi, or rales. ABDOMEN:  Soft, nontender, positive bowel sounds. EXTREMITIES:  No clubbing, cyanosis, or edema. MUSCULOSKELETAL:  No joint deformities or effusions. NEUROLOGIC:  Alert and oriented x3, cranial nerves II through XII grossly intact.  Chest x-ray and labs pending.  EKG showing normal sinus rhythm at a rate of 57 beats per minute.  There are 1-  to 2-mm ST elevations in leads II, III, and aVF.  There is residual ST depressions in I, aVL, V1, and V2.  ASSESSMENT AND PLAN:  This is a 45 year old gentleman with history of tobacco abuse who presents with chest pain.  The patient has been found to have acute inferior myocardial infarction.  A code STEMI has been initiated, and the patient has been brought to the cath lab for emergent catheterization and possible percutaneous coronary intervention.  Dr. Kirke Corin is present and emergent consent has been obtained.  Post catherization  the patient will be taken to the CCU for further care. Further treatment will be dependent upon the above results.     Leonette Monarch, PA-C   ______________________________ Lorine Bears, MD    NB/MEDQ  D:  01/22/2011  T:  01/23/2011  Job:  [id number]  Electronically Signed by Alen Blew P.A. on 01/23/2011 02:31:07 PM Electronically Signed by Lorine Bears MD on 02/12/2011 02:58:05 PM

## 2011-02-28 NOTE — Op Note (Signed)
NAME:  Charles Holder, Charles Holder               ACCOUNT NO.:  [id number]   MEDICAL RECORD NO.:  [id number]          PATIENT TYPE:  INP   LOCATION:  2550                         FACILITY:  [facility]   PHYSICIAN:  Sharolyn Douglas, M.D.        DATE OF BIRTH:  16-Nov-1965   DATE OF PROCEDURE:  02/02/2007  DATE OF DISCHARGE:                               OPERATIVE REPORT   DIAGNOSIS:  1. L5-S1 degenerative disc disease.  2. L5-S1 post laminectomy syndrome with recurrent left disc      herniation.  3. Chronic back and left greater than right lower extremity pain.   PROCEDURE:  1. Revision L5-S1 laminectomy with decompression of the left L5 and S1      nerve roots.  2. Posterior spinal arthrodesis L5-S1.  3. Transforaminal lumbar interbody fusion L5-S1 with placement of 9-mm      PEEK cage.  4. Pedicle screw instrumentation L5-S1 using the Abbott spine system.  5. Local autogenous bone graft supplemented with BMP.   SURGEON:  Sharolyn Douglas, M.D.   ASSISTANTJill Side Mahar, P.A.-C.   ANESTHESIA:  General endotracheal.   ESTIMATED BLOOD LOSS:  300 mL   COMPLICATIONS:  None.   COUNTS:  Needle and sponge count correct.   INDICATIONS:  The patient is a pleasant 45 year old male with chronic  back and left greater than right lower extremity pain.  He has had a  previous hemilaminotomy on the left side at L5-S1.  His imaging studies  show severe disc degeneration at L5-S1 with probable recurrent disc  herniation on the left side.  There is associated epidural scarring, as  well.  He has failed other conservative treatment modalities and now  presents for revision L5-S1 decompression and fusion.  The risks,  benefits, and alternatives were reviewed.   DESCRIPTION OF PROCEDURE:  After informed consent, he was taken to the  operating room.  He underwent general endotracheal anesthesia without  difficulty and given prophylactic IV antibiotics.  Neural monitoring was  established in the form of lower extremity  EMGs and SSEPs.  He was  carefully turned prone onto the Wilson frame.  All bony prominences were  padded.  The face and eyes were protected at all times.  The back was  prepped and draped in the usual sterile fashion.  The previous midline  incision was utilized and extended several inches proximally.  Dissection was carried through the previous scar.  Subperiosteal  exposure was carried out to the tips of the transverse processes of L5  and the sacral ala bilaterally.  Intraoperative x-ray was taken to  confirm the levels.  A deep retractor was placed.   We then turned our attention to placing pedicle screws at L5 and S1  bilaterally using anatomic probing technique.  Each pedicle hole was  palpated with a ball tip feeler and there were no breeches.  The pedicle  holes were then tapped and once again palpated with no breeches.  We  placed 6.5 x 50 mm screws in L5 and 7.5 x 35 mm screws in the sacrum.  The bone quality  was good and the screw purchase was adequate.  We then  stimulated each pedicle screw using triggered EMGs and there were no  deleterious changes.   At this point, we turned our attention to performing a revision  laminectomy.  The previous edges of the laminotomy defect were carefully  defined using curets, loupes, and headlight magnification.  We found  that the thecal sac had become adherent to the facet joint on the left  side and the underlying lamina.  Once this was carefully dissected free,  the laminectomy was extended proximally.  We identified the L5 and S1  nerve roots which were extremely scarred down to the underlying disc  space.  By working just medial to the S1 pedicle.  We were able to  define a plane and identify the underlying disc space.  Several  fragments of disc material were removed which had extruded up underneath  the thecal sac and transversing S1 nerve root.   At this point, we elected to proceed with a transforaminal lumbar  interbody  fusion on the left side at L5-S1 to allow for further  decompression of the L5 nerve root within the neural foramen and also to  improve the fusion rate and address the patient's diskogenic back pain.  The disc space was entered and dilated up to 9 mm.  Radical discectomy  was completed.  Curved curets were used to scrape the cartilaginous  endplates.  We applied distraction through the pedicle screws allowing  further clean out of the interspace.  We then packed the disc space with  local bone graft obtained from the laminectomy along with BMP sponges.  We then inserted a 9 mm PEEK cage which had been packed with a BMP  sponge into the interspace, tamped it anteriorly and across the midline.  We completed the posterior spinal fusion by decorticating the transverse  processes of L5 and the sacral ala bilaterally and packed the remaining  local bone graft into the lateral gutters.  Short segment titanium rods  were placed into the polyaxial screw heads.  Compression was applied  before shearing off the locking caps.  Hemostasis was achieved.  Gelfoam  was left over the exposed epidural space.   A deep Hemovac drain was left in place.  The deep fascia was closed with  a running #1 Vicryl suture, the subcutaneous layer closed with 2-0  Vicryl suture followed by a running 3-0 subcuticular Vicryl suture on  the skin edges.  Benzoin and Steri-Strips were placed.  A sterile  dressing was applied.  Intraoperative x-ray was taken which showed  appropriate positioning of the instrumentation at L5-S1 with good  distraction of the disc space.  The patient was transferred to recovery  in stable condition neurologically intact.  It should be noted that my  assistant, Verlin Fester, P.A.-C., was present throughout the procedure  including positioning, the exposure, the decompression, the  instrumentation, and she also assisted with wound closure.      Sharolyn Douglas, M.D.  Electronically  Signed    MC/MEDQ  D:  02/03/2007  T:  02/03/2007  Job:  [id number]

## 2011-02-28 NOTE — Discharge Summary (Signed)
Charles Holder, Charles Holder               ACCOUNT NO.:  [id number]   MEDICAL RECORD NO.:  [id number]          PATIENT TYPE:  INP   LOCATION:  5013                         FACILITY:  [facility]   PHYSICIAN:  Verlin Fester, P.A.    DATE OF BIRTH:  30-May-1966   DATE OF ADMISSION:  02/02/2007  DATE OF DISCHARGE:  02/06/2007                               DISCHARGE SUMMARY   ADMITTING DIAGNOSES:  1. L5-S1 degenerative disk disease and postlaminectomy syndrome.  2. Otherwise healthy.   DISCHARGE DIAGNOSES:  1. Status post L5-S1 fusion.  2. Postoperative blood loss anemia.   PROCEDURE:  On February 03, 2007, the patient was taken to the operating  room for an L5-S1 posterior spinal fusion with pedicle screws and  transforaminal lumbar interbody fusion.   SURGEON:  Sharolyn Douglas, M.D.   ASSISTANT:  Verlin Fester, P.A.-C and Orlin Hilding, P.A.-C.   ANESTHESIA:  General.   CONSULTS:  None.   LABS:  CBC with differential preop was normal, with the exception of  white count of 10.8, hemoglobin 17.1.  H&H monitored x3 days  postoperatively.  Reached a low of 13.2 and 38.8 on postop day 2.  Coags  preoperatively were normal.  Complete metabolic panel preop was normal.  Basic metabolic panel x2 days postoperatively have one episode of  elevated glucose of 104 on February 04, 2007, otherwise normal.  UA preop  was negative.  Urine culture showed no growth.  Blood type was A+.  Antibody screen negative.  X-ray was used intraoperatively for  localization.  No EKG was seen on the chart.   BRIEF HISTORY:  The patient is a 45 year old male who has had a long  history of problems with his back.  He has had a previous laminectomy,  which unfortunately did not give him any permanent relief of his  symptoms.  His symptoms did return in his back and also down his leg.  His pain had gotten to the point that it was inhibiting his ability to  function and work.  He is quite frustrated with his pain.  He failed to  conservative treatments.  Because of all of this, as well as x-ray and  MRI findings, it was felt his best course of management would be  revision decompression and posterior spinal fusion of L5-S1.  The risks  and benefits of the procedure were discussed with the patient at length  by Dr. Noel Gerold, as well as myself, and he indicated understanding and  opted to proceed.   HOSPITAL COURSE:  On February 03, 2007, the patient was taken to the  operating room for the above listed procedure.  He tolerated the  procedure well without intraoperative complications and was transferred  to the recovery room in stable condition.   Postoperatively, routine orthopedic spine protocol was followed and he  progressed along very well with that.  He did not develop any medical  complication postoperatively.   Physical therapy and occupational therapy worked with the patient on a  daily basis on a progressive manipulation program, back precautions, and  brace use.  He got independent and  safe prior to discharge with all of  the above.   By February 06, 2007, the patient had met all orthopedic goals.  He is  medically stable and ready for discharge home.   DISCHARGE PLAN:  The patient is a 45 year old male, status post  posterior spinal fusion at L5-S1, doing very well.  Activity is brace on  when he is up, daily ambulation program, back precautions at all times,  no lifting greater than 5 pounds, may shower.   FOLLOWUP:  Two weeks postoperatively with Dr. Noel Gerold at Dr. Wells Guiles  office.   DIET:  Regular home diet as tolerated.   MEDICATIONS ON DISCHARGE:  1. Vicodin for pain.  2. Robaxin for multiple spasm.  3. Multivitamin daily.  4. Calcium 1200 daily.  5. Colace 100 mg twice daily.  6. Laxative as needed.  7. No NSAIDs x3 months.   CONDITION ON DISCHARGE:  Stable, improved.   DISPOSITION:  The patient is being discharged to his home with his  family assistance, as well as home health physical  therapy and  occupational therapy with [place].      Verlin Fester, P.A.     CM/MEDQ  D:  02/06/2007  T:  02/06/2007  Job:  [id number]

## 2011-07-09 ENCOUNTER — Other Ambulatory Visit: Payer: Self-pay | Admitting: Internal Medicine

## 2011-07-23 ENCOUNTER — Ambulatory Visit (INDEPENDENT_AMBULATORY_CARE_PROVIDER_SITE_OTHER): Payer: Managed Care, Other (non HMO) | Admitting: Cardiovascular Disease

## 2011-07-23 ENCOUNTER — Encounter: Payer: Self-pay | Admitting: Cardiovascular Disease

## 2011-07-23 VITALS — BP 100/68 | HR 55 | Ht 70.0 in | Wt 198.0 lb

## 2011-07-23 DIAGNOSIS — Z9861 Coronary angioplasty status: Secondary | ICD-10-CM

## 2011-07-23 DIAGNOSIS — E785 Hyperlipidemia, unspecified: Secondary | ICD-10-CM

## 2011-07-23 DIAGNOSIS — Z955 Presence of coronary angioplasty implant and graft: Secondary | ICD-10-CM

## 2011-07-23 DIAGNOSIS — I2510 Atherosclerotic heart disease of native coronary artery without angina pectoris: Secondary | ICD-10-CM

## 2011-07-23 DIAGNOSIS — IMO0001 Reserved for inherently not codable concepts without codable children: Secondary | ICD-10-CM

## 2011-07-23 DIAGNOSIS — M549 Dorsalgia, unspecified: Secondary | ICD-10-CM

## 2011-07-23 DIAGNOSIS — F17200 Nicotine dependence, unspecified, uncomplicated: Secondary | ICD-10-CM

## 2011-07-23 MED ORDER — SPIRONOLACTONE 25 MG PO TABS: 25.0000 mg | ORAL_TABLET | Freq: Every day | ORAL | Status: DC

## 2011-07-23 MED ORDER — LISINOPRIL 2.5 MG PO TABS: 2.5000 mg | ORAL_TABLET | Freq: Every day | ORAL | Status: DC

## 2011-07-23 MED ORDER — CLOPIDOGREL BISULFATE 75 MG PO TABS: 75.0000 mg | ORAL_TABLET | Freq: Every day | ORAL | Status: AC

## 2011-07-23 MED ORDER — ATORVASTATIN CALCIUM 40 MG PO TABS: 40.0000 mg | ORAL_TABLET | Freq: Every day | ORAL | Status: DC

## 2011-07-23 MED ORDER — CARVEDILOL 3.125 MG PO TABS: 3.1250 mg | ORAL_TABLET | Freq: Two times a day (BID) | ORAL | Status: DC

## 2011-07-23 NOTE — Assessment & Plan Note (Signed)
Currently with no symptoms of angina. No further workup at this time. Continue current medication regimen. 
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
## 2011-07-23 NOTE — Assessment & Plan Note (Signed)
We will draw a cholesterol today to make sure that he is at goal with an LDL less than 70

## 2011-07-23 NOTE — Patient Instructions (Addendum)
You are doing well. Change effient to plavix 75 mg daily (pt is checking on price prior to change) Please call us if you have new issues that need to be addressed before your next appt.  We will call you for a follow up Appt. In 12 months

## 2011-07-23 NOTE — Assessment & Plan Note (Signed)
We did talk to him about smoking. We have encouraged Chantix, electronic cigarette, nicotine patches

## 2011-07-23 NOTE — Assessment & Plan Note (Signed)
As it has been over 6 months, and he has difficulty affording effient, we will change him to Plavix 81 mg with aspirin 81 mg daily.

## 2012-05-12 ENCOUNTER — Telehealth: Payer: Self-pay

## 2012-05-12 NOTE — Telephone Encounter (Signed)
Pt says he had cholesterol checked via [employer] at [facility] I will call to see if they have any record of this

## 2012-05-12 NOTE — Telephone Encounter (Signed)
Rec'd correspondence from [facility] stating pt has not had chol checked in over 1 year.  According to our records, last lipid/liver was 01/2011 I will call pt to have him come in for labs

## 2012-05-13 NOTE — Telephone Encounter (Signed)
I called [facility] who says they have no record or recent lipid panel within the last 6 months We will get at next OV in October

## 2012-06-02 ENCOUNTER — Other Ambulatory Visit: Payer: Self-pay

## 2012-06-02 MED ORDER — NITROGLYCERIN 0.4 MG SL SUBL: 0.4000 mg | SUBLINGUAL_TABLET | SUBLINGUAL | Status: DC | PRN

## 2012-06-02 NOTE — Telephone Encounter (Signed)
Refill sent for NTG 0.4 mg take as directed.  

## 2012-08-31 ENCOUNTER — Encounter: Payer: Self-pay | Admitting: Cardiovascular Disease

## 2012-08-31 ENCOUNTER — Ambulatory Visit (INDEPENDENT_AMBULATORY_CARE_PROVIDER_SITE_OTHER): Payer: Managed Care, Other (non HMO) | Admitting: Cardiovascular Disease

## 2012-08-31 VITALS — BP 126/87 | HR 62 | Ht 70.0 in | Wt 206.0 lb

## 2012-08-31 DIAGNOSIS — I2510 Atherosclerotic heart disease of native coronary artery without angina pectoris: Secondary | ICD-10-CM

## 2012-08-31 DIAGNOSIS — E785 Hyperlipidemia, unspecified: Secondary | ICD-10-CM

## 2012-08-31 DIAGNOSIS — R079 Chest pain, unspecified: Secondary | ICD-10-CM

## 2012-08-31 DIAGNOSIS — F17200 Nicotine dependence, unspecified, uncomplicated: Secondary | ICD-10-CM

## 2012-08-31 DIAGNOSIS — I10 Essential (primary) hypertension: Secondary | ICD-10-CM

## 2012-08-31 MED ORDER — ATORVASTATIN CALCIUM 40 MG PO TABS: 40.0000 mg | ORAL_TABLET | Freq: Every day | ORAL | Status: DC

## 2012-08-31 MED ORDER — CLOPIDOGREL BISULFATE 75 MG PO TABS: 75.0000 mg | ORAL_TABLET | Freq: Every day | ORAL | Status: DC

## 2012-08-31 MED ORDER — CARVEDILOL 3.125 MG PO TABS: 3.1250 mg | ORAL_TABLET | Freq: Two times a day (BID) | ORAL | Status: DC

## 2012-08-31 MED ORDER — LISINOPRIL 2.5 MG PO TABS: 2.5000 mg | ORAL_TABLET | Freq: Every day | ORAL | Status: DC

## 2012-08-31 NOTE — Assessment & Plan Note (Signed)
He is not taking Lipitor. We have renewed this for him and stressed the importance of medication compliance.

## 2012-08-31 NOTE — Assessment & Plan Note (Signed)
We have encouraged him to continue to work on weaning his cigarettes and smoking cessation. He will continue to work on this and does not want any assistance with chantix.  

## 2012-08-31 NOTE — Assessment & Plan Note (Signed)
Currently with no symptoms of angina. No further workup at this time. Continue current medication regimen. 

## 2012-08-31 NOTE — Patient Instructions (Addendum)
You are doing well. Stop effient  Start plavix one a day with aspirin Start atorvastatin for cholesterol If you can afford, start coreg twice a day, lisinopril one a day  We will check blood work today  Please call us if you have new issues that need to be addressed before your next appt.  Your physician wants you to follow-up in: 6 months.  You will receive a reminder letter in the mail two months in advance. If you don't receive a letter, please call our office to schedule the follow-up appointment.
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
## 2012-08-31 NOTE — Assessment & Plan Note (Signed)
Diastolic pressure initially 90, repeat barely less than 90. We have stressed the importance of taking his medications. We  have refilled Coreg and lisinopril.

## 2012-09-01 LAB — BASIC METABOLIC PANEL
BUN/Creatinine Ratio: 10 (ref 9–20)
CO2: 22 mmol/L (ref 19–28)
Chloride: 104 mmol/L (ref 97–108)
Creatinine, Ser: 1.24 mg/dL (ref 0.76–1.27)
GFR calc Af Amer: 80 mL/min/{1.73_m2} (ref >59)
Sodium: 142 mmol/L (ref 134–144)

## 2012-09-01 LAB — LIPID PANEL
Cholesterol, Total: 203 mg/dL — ABNORMAL HIGH (ref 100–199)
LDL Calculated: 148 mg/dL — ABNORMAL HIGH (ref 0–99)
Triglycerides: 152 mg/dL — ABNORMAL HIGH (ref 0–149)
VLDL Cholesterol Cal: 30 mg/dL (ref 5–40)

## 2013-09-06 ENCOUNTER — Telehealth: Payer: Self-pay

## 2013-09-06 NOTE — Telephone Encounter (Signed)
LMOM to have patient contact our office for a refill on Plavix since he is past due for a follow up appointment with Dr. Mariah Milling.

## 2013-09-07 ENCOUNTER — Other Ambulatory Visit: Payer: Self-pay

## 2013-09-07 MED ORDER — CLOPIDOGREL BISULFATE 75 MG PO TABS: 75.0000 mg | ORAL_TABLET | Freq: Every day | ORAL | Status: DC

## 2013-09-07 NOTE — Telephone Encounter (Signed)
Patient made a follow up appointment with Dr. Mariah Milling and a refill has been sent to [facility] on [address]

## 2013-09-07 NOTE — Telephone Encounter (Signed)
Refill sent for plavix 75 mg take one tablet daily.  

## 2013-09-14 ENCOUNTER — Ambulatory Visit (INDEPENDENT_AMBULATORY_CARE_PROVIDER_SITE_OTHER): Payer: Managed Care, Other (non HMO) | Admitting: Cardiovascular Disease

## 2013-09-14 ENCOUNTER — Encounter: Payer: Self-pay | Admitting: Cardiovascular Disease

## 2013-09-14 ENCOUNTER — Encounter (INDEPENDENT_AMBULATORY_CARE_PROVIDER_SITE_OTHER): Payer: Self-pay

## 2013-09-14 VITALS — BP 188/114 | HR 63 | Ht 70.0 in | Wt 213.0 lb

## 2013-09-14 DIAGNOSIS — I10 Essential (primary) hypertension: Secondary | ICD-10-CM

## 2013-09-14 DIAGNOSIS — I2510 Atherosclerotic heart disease of native coronary artery without angina pectoris: Secondary | ICD-10-CM

## 2013-09-14 DIAGNOSIS — E785 Hyperlipidemia, unspecified: Secondary | ICD-10-CM

## 2013-09-14 DIAGNOSIS — I25810 Atherosclerosis of coronary artery bypass graft(s) without angina pectoris: Secondary | ICD-10-CM

## 2013-09-14 DIAGNOSIS — M79609 Pain in unspecified limb: Secondary | ICD-10-CM

## 2013-09-14 DIAGNOSIS — R079 Chest pain, unspecified: Secondary | ICD-10-CM

## 2013-09-14 DIAGNOSIS — F17200 Nicotine dependence, unspecified, uncomplicated: Secondary | ICD-10-CM

## 2013-09-14 DIAGNOSIS — M79602 Pain in left arm: Secondary | ICD-10-CM

## 2013-09-14 MED ORDER — AMLODIPINE BESYLATE 10 MG PO TABS: 10.0000 mg | ORAL_TABLET | Freq: Every day | ORAL | Status: DC

## 2013-09-14 MED ORDER — LISINOPRIL 20 MG PO TABS: 20.0000 mg | ORAL_TABLET | Freq: Every day | ORAL | Status: DC

## 2013-09-14 NOTE — Assessment & Plan Note (Signed)
He has left arm discomfort sometimes at rest and with exertion. We have talked about cardiac catheterization, stress testing. He prefers to work on his blood pressure first. Unable to exclude symptoms from his chronic back pain.

## 2013-09-14 NOTE — Assessment & Plan Note (Signed)
Known CAD. Currently with poor blood pressure, for cholesterol. Encouraged him that we need to work on both

## 2013-09-14 NOTE — Assessment & Plan Note (Signed)
He does not want Lipitor. Last cholesterol greater than 200 last year. We will start pravastatin 20 mg daily with titration upwards

## 2013-09-14 NOTE — Assessment & Plan Note (Signed)
Recommended smoking cessation. 
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
## 2013-09-14 NOTE — Patient Instructions (Signed)
You are doing well. No medication changes were made.  Please start amlodipine 10 mg a day, for blood pressure If blood pressure continue to run high (top number >140),  Start lisinopril 20 mg daily  Call the office with blood pressure numbers  Please start pravastatin 20 mg daily  Please call us if you have new issues that need to be addressed before your next appt.  Your physician wants you to follow-up in: 2 month.

## 2013-09-14 NOTE — Assessment & Plan Note (Signed)
Severely elevated blood pressure. Recommended restart amlodipine 10 mg daily. If systolic pressure continues to be greater than 140, we'll start lisinopril 20 mg daily

## 2013-09-29 ENCOUNTER — Telehealth: Payer: Self-pay

## 2013-09-29 NOTE — Telephone Encounter (Signed)
Pt wife states BP: 115/73. Also has a question about medication. Pt wife states pt for the last 2 weeks has been "so tired, has been going to bed at 7:00 every night". Not sure if this is from the medication. Please call.

## 2013-09-29 NOTE — Telephone Encounter (Signed)
Spoke w/ Olegario Messier, pt's wife.  She reports that pt's BP was 188/114 at last ov, so pt was started on 2 BP meds. Reports that readings have been dropping daily, yesterday 115/70.   States that pt does not feel good, has been tired, no energy, just wants to sleep and has been going to bed every night at 7:00. On last ov, pt was instructed to "start amlodipine 10 mg a day, for blood pressure, if blood pressure continue to run high (top number >140), start lisinopril 20 mg daily". Wife states that she misunderstood orders and has been giving pt both amlodipine AND lisinopril daily. Instructed her to only give amlodipine unless systolic pressures are >140, at which time she would add the lisinopril. She is agreeable to this and will try this for a few days and call back to let me know how pt is feeling.

## 2013-11-21 ENCOUNTER — Ambulatory Visit (INDEPENDENT_AMBULATORY_CARE_PROVIDER_SITE_OTHER): Payer: Managed Care, Other (non HMO) | Admitting: Cardiovascular Disease

## 2013-11-21 ENCOUNTER — Encounter: Payer: Self-pay | Admitting: Cardiovascular Disease

## 2013-11-21 VITALS — BP 110/70 | HR 53 | Ht 70.0 in | Wt 212.5 lb

## 2013-11-21 DIAGNOSIS — I10 Essential (primary) hypertension: Secondary | ICD-10-CM

## 2013-11-21 DIAGNOSIS — E785 Hyperlipidemia, unspecified: Secondary | ICD-10-CM

## 2013-11-21 DIAGNOSIS — R079 Chest pain, unspecified: Secondary | ICD-10-CM

## 2013-11-21 DIAGNOSIS — I2510 Atherosclerotic heart disease of native coronary artery without angina pectoris: Secondary | ICD-10-CM

## 2013-11-21 DIAGNOSIS — F17200 Nicotine dependence, unspecified, uncomplicated: Secondary | ICD-10-CM

## 2013-11-21 DIAGNOSIS — Z79899 Other long term (current) drug therapy: Secondary | ICD-10-CM

## 2013-11-21 MED ORDER — NITROGLYCERIN 0.4 MG SL SUBL: 0.4000 mg | SUBLINGUAL_TABLET | SUBLINGUAL | Status: DC | PRN

## 2013-11-21 MED ORDER — LISINOPRIL 20 MG PO TABS: 20.0000 mg | ORAL_TABLET | ORAL | Status: DC | PRN

## 2013-11-21 MED ORDER — LOVASTATIN 20 MG PO TABS: 20.0000 mg | ORAL_TABLET | Freq: Every day | ORAL | Status: DC

## 2013-11-21 NOTE — Patient Instructions (Addendum)
You are doing well. Please start lovastatin one a day for cholesterol Start aspirin 81 mg daily, Continue plavix  We will do blood work today  Please call us if you have new issues that need to be addressed before your next appt.  Your physician wants you to follow-up in: 6 months.  You will receive a reminder letter in the mail two months in advance. If you don't receive a letter, please call our office to schedule the follow-up appointment.

## 2013-11-21 NOTE — Assessment & Plan Note (Addendum)
Currently with no symptoms of angina. No further workup at this time. Continue current medication regimen. Recommended he restart aspirin, statin, stopped smoking
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
## 2013-11-21 NOTE — Assessment & Plan Note (Signed)
He took 20 mg lisinopril today with amlodipine. Blood pressure better. Recommended he closely monitor his blood pressure at home. If running high, add lisinopril 10 mg with his amlodipine

## 2013-11-21 NOTE — Assessment & Plan Note (Signed)
We have encouraged him to continue to work on weaning his cigarettes and smoking cessation. He will continue to work on this and does not want any assistance with chantix.  

## 2013-11-21 NOTE — Assessment & Plan Note (Signed)
Recommended he start lovastatin 20 mg daily. Goal LDL less than 70.

## 2013-11-21 NOTE — Assessment & Plan Note (Signed)
Chest pain symptoms seem to have resolved on his current medication regimen. No further workup at this time

## 2013-12-16 ENCOUNTER — Ambulatory Visit (INDEPENDENT_AMBULATORY_CARE_PROVIDER_SITE_OTHER): Payer: Managed Care, Other (non HMO) | Admitting: Cardiovascular Disease

## 2013-12-16 DIAGNOSIS — Z79899 Other long term (current) drug therapy: Secondary | ICD-10-CM

## 2013-12-16 DIAGNOSIS — I2510 Atherosclerotic heart disease of native coronary artery without angina pectoris: Secondary | ICD-10-CM

## 2013-12-17 LAB — LIPID PANEL
Chol/HDL Ratio: 5.9 ratio units — ABNORMAL HIGH (ref 0.0–5.0)
Cholesterol, Total: 177 mg/dL (ref 100–199)
HDL: 30 mg/dL — AB (ref >39)
LDL Calculated: 117 mg/dL — ABNORMAL HIGH (ref 0–99)
Triglycerides: 148 mg/dL (ref 0–149)
VLDL Cholesterol Cal: 30 mg/dL (ref 5–40)

## 2013-12-18 LAB — BASIC METABOLIC PANEL: GLUCOSE, PLASMA: 91 mg/dL (ref 65–99)

## 2013-12-23 ENCOUNTER — Telehealth: Payer: Self-pay | Admitting: *Deleted

## 2013-12-23 NOTE — Telephone Encounter (Signed)
Patient's wife called and would his lab results.

## 2014-01-09 ENCOUNTER — Other Ambulatory Visit: Payer: Self-pay

## 2014-01-09 MED ORDER — LOVASTATIN 40 MG PO TABS: 40.0000 mg | ORAL_TABLET | Freq: Every day | ORAL | Status: DC

## 2014-04-10 ENCOUNTER — Emergency Department (HOSPITAL_COMMUNITY)
Admission: EM | Admit: 2014-04-10 | Discharge: 2014-04-11 | Disposition: A | Discharge status: Home / Self Care | Payer: Managed Care, Other (non HMO) | Attending: Emergency Medicine | Admitting: Emergency Medicine

## 2014-04-10 ENCOUNTER — Encounter (HOSPITAL_COMMUNITY): Payer: Self-pay | Admitting: Emergency Medicine

## 2014-04-10 ENCOUNTER — Emergency Department (HOSPITAL_COMMUNITY): Payer: Managed Care, Other (non HMO)

## 2014-04-10 DIAGNOSIS — Z9861 Coronary angioplasty status: Secondary | ICD-10-CM | POA: Insufficient documentation

## 2014-04-10 DIAGNOSIS — R079 Chest pain, unspecified: Secondary | ICD-10-CM

## 2014-04-10 DIAGNOSIS — I10 Essential (primary) hypertension: Secondary | ICD-10-CM | POA: Insufficient documentation

## 2014-04-10 DIAGNOSIS — E119 Type 2 diabetes mellitus without complications: Secondary | ICD-10-CM | POA: Insufficient documentation

## 2014-04-10 DIAGNOSIS — Z79899 Other long term (current) drug therapy: Secondary | ICD-10-CM | POA: Insufficient documentation

## 2014-04-10 DIAGNOSIS — Z7982 Long term (current) use of aspirin: Secondary | ICD-10-CM | POA: Insufficient documentation

## 2014-04-10 DIAGNOSIS — Z7902 Long term (current) use of antithrombotics/antiplatelets: Secondary | ICD-10-CM | POA: Insufficient documentation

## 2014-04-10 DIAGNOSIS — R1013 Epigastric pain: Secondary | ICD-10-CM | POA: Insufficient documentation

## 2014-04-10 DIAGNOSIS — F17200 Nicotine dependence, unspecified, uncomplicated: Secondary | ICD-10-CM | POA: Insufficient documentation

## 2014-04-10 DIAGNOSIS — I2510 Atherosclerotic heart disease of native coronary artery without angina pectoris: Secondary | ICD-10-CM | POA: Insufficient documentation

## 2014-04-10 DIAGNOSIS — G8929 Other chronic pain: Secondary | ICD-10-CM | POA: Insufficient documentation

## 2014-04-10 DIAGNOSIS — E785 Hyperlipidemia, unspecified: Secondary | ICD-10-CM | POA: Insufficient documentation

## 2014-04-10 LAB — BASIC METABOLIC PANEL
BUN: 13 mg/dL (ref 6–23)
CALCIUM: 9 mg/dL (ref 8.4–10.5)
CO2: 21 mEq/L (ref 19–32)
CREATININE: 1.12 mg/dL (ref 0.50–1.35)
Chloride: 100 mEq/L (ref 96–112)
GFR calc non Af Amer: 75 mL/min — ABNORMAL LOW (ref >90)
GFR, EST AFRICAN AMERICAN: 87 mL/min — AB (ref >90)
Glucose, Bld: 122 mg/dL — ABNORMAL HIGH (ref 70–99)
Potassium: 3.9 mEq/L (ref 3.7–5.3)
SODIUM: 138 meq/L (ref 137–147)

## 2014-04-10 LAB — I-STAT TROPONIN, ED: TROPONIN I, POC: 0 ng/mL (ref 0.00–0.08)

## 2014-04-10 LAB — CBC
HCT: 44.4 % (ref 39.0–52.0)
Hemoglobin: 15.1 g/dL (ref 13.0–17.0)
MCH: 29.3 pg (ref 26.0–34.0)
MCHC: 34 g/dL (ref 30.0–36.0)
MCV: 86 fL (ref 78.0–100.0)
PLATELETS: 270 10*3/uL (ref 150–400)
RBC: 5.16 MIL/uL (ref 4.22–5.81)
RDW: 12.9 % (ref 11.5–15.5)
WBC: 11.8 10*3/uL — ABNORMAL HIGH (ref 4.0–10.5)

## 2014-04-10 LAB — PROTIME-INR
INR: 0.89 (ref 0.00–1.49)
Prothrombin Time: 12.1 seconds (ref 11.6–15.2)

## 2014-04-10 NOTE — ED Notes (Signed)
Re-paged cardiology. 
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
## 2014-04-11 ENCOUNTER — Telehealth: Payer: Self-pay | Admitting: Family Medicine

## 2014-04-11 LAB — TROPONIN I: Troponin I: <0.3 ng/mL (ref <0.30)

## 2014-04-11 NOTE — ED Provider Notes (Signed)
CSN: [id number]     Arrival date & time 04/10/14  1957 History   First MD Initiated Contact with Patient 04/10/14 2045     Chief Complaint  Patient presents with  . Chest Pain     (Consider location/radiation/quality/duration/timing/severity/associated sxs/prior Treatment) HPI Comments: Pt with CAD s/p stent 3 years ago comes in with cc of chest pain. Pt reports having off and on chest pain daily - however, today he had severe epigastric and midsternal chest pain x 2 episodes, severe, and lasted only a few seconds. The pain was sharp. He also had more episodes of his regular chest pain - so he decided to come to the ER.   Patient is a 48 y.o. male presenting with chest pain. The history is provided by the patient.  Chest Pain Associated symptoms: dizziness   Associated symptoms: no cough, no fever, no headache and no shortness of breath     Past Medical History  Diagnosis Date  . Hyperlipidemia   . Hypertension   . Coronary artery disease   . Neuropathy   . Cardiomyopathy, ischemic 01/22/2011    EF 50%  . Chronic lower back pain     s/p L5-S1  . Diabetes mellitus    Past Surgical History  Procedure Laterality Date  . Coronary angioplasty  01/22/2011    stent   Family History  Problem Relation Age of Onset  . Adopted: Yes   History  Substance Use Topics  . Smoking status: Current Every Day Smoker -- 0.10 packs/day    Types: Cigarettes  . Smokeless tobacco: Never Used  . Alcohol Use: No    Review of Systems  Constitutional: Negative for fever, chills and activity change.  Eyes: Negative for visual disturbance.  Respiratory: Negative for cough, chest tightness and shortness of breath.   Cardiovascular: Positive for chest pain.  Gastrointestinal: Negative for abdominal distention.  Genitourinary: Negative for dysuria, enuresis and difficulty urinating.  Musculoskeletal: Negative for arthralgias and neck pain.  Neurological: Positive for dizziness. Negative for  light-headedness and headaches.  Psychiatric/Behavioral: Negative for confusion.      Allergies  Codeine  Home Medications   Prior to Admission medications   Medication Sig Start Date End Date Taking? Authorizing Provider  amLODipine (NORVASC) 10 MG tablet Take 1 tablet (10 mg total) by mouth daily. 09/14/13  Yes Minna Merritts, MD  aspirin 81 MG EC tablet Take 81 mg by mouth daily.     Yes Historical Provider, MD  clopidogrel (PLAVIX) 75 MG tablet Take 1 tablet (75 mg total) by mouth daily. 09/07/13  Yes Minna Merritts, MD  lovastatin (MEVACOR) 40 MG tablet Take 1 tablet (40 mg total) by mouth at bedtime. 01/09/14  Yes Minna Merritts, MD  nitroGLYCERIN (NITROSTAT) 0.4 MG SL tablet Place 1 tablet (0.4 mg total) under the tongue every 5 (five) minutes as needed. 11/21/13  Yes Minna Merritts, MD   BP 121/65  Pulse 66  Temp(Src) 98.7 F (37.1 C) (Oral)  Resp 12  Ht 5\' 10"  (1.778 m)  Wt 218 lb (98.884 kg)  BMI 31.28 kg/m2  SpO2 96% Physical Exam  Nursing note and vitals reviewed. Constitutional: He is oriented to person, place, and time. He appears well-developed.  HENT:  Head: Normocephalic and atraumatic.  Eyes: Conjunctivae and EOM are normal. Pupils are equal, round, and reactive to light.  Neck: Normal range of motion. Neck supple.  Cardiovascular: Normal rate, regular rhythm and intact distal pulses.   Pulmonary/Chest: Effort  normal and breath sounds normal.  Abdominal: Soft. Bowel sounds are normal. He exhibits no distension. There is no tenderness. There is no rebound and no guarding.  Neurological: He is alert and oriented to person, place, and time.  Skin: Skin is warm.    ED Course  Procedures (including critical care time) Labs Review Labs Reviewed  CBC - Abnormal; Notable for the following:    WBC 11.8 (*)    All other components within normal limits  BASIC METABOLIC PANEL - Abnormal; Notable for the following:    Glucose, Bld 122 (*)    GFR calc non Af  Amer 75 (*)    GFR calc Af Amer 87 (*)    All other components within normal limits  PROTIME-INR  TROPONIN I  I-STAT TROPOININ, ED    Imaging Review No results found.   EKG Interpretation   Date/Time:  Monday April 10 2014 20:07:31 EDT Ventricular Rate:  71 PR Interval:  132 QRS Duration: 81 QT Interval:  438 QTC Calculation: 476 R Axis:   55 Text Interpretation:  Sinus rhythm Low voltage, precordial leads  Borderline repolarization abnormality ST elevation, consider inferior  injury Borderline prolonged QT interval ED PHYSICIAN INTERPRETATION  AVAILABLE IN [facility] Confirmed by TEST, Record ([id number]) on 04/12/2014  8:23:05 AM      MDM   Final diagnoses:  Chest pain, unspecified chest pain type    Pt comes in to the ER with cc of chest pain. Atypical chest pain - however, he has CAD hx and is diabetic. He has been having intermittent, daily chest pains - but today he had 2-3 episodes of severe pain, lasting few seconds. Given his hx, we consulted Cardiology to help with the disposition, and they advised that if trop x 2 are neg, patient will be seen in the clinic for further risk stratification.  Smoking cessation instruction/counseling given:  counseled patient on the dangers of tobacco use, advised patient to stop smoking, and reviewed strategies to maximize success. Discussion 3-5 minutes.      Varney Biles, MD 04/13/14 [phone]

## 2014-04-11 NOTE — ED Notes (Signed)
Explained wait to patient and family and apologized for delay in seeing cardiologist.

## 2014-04-11 NOTE — Discharge Instructions (Signed)
We saw you in the ER for the chest pain/shortness of breath. All of our cardiac workup is normal, including labs, EKG and chest X-RAY are normal. We are not sure what is causing your discomfort, but we feel comfortable sending you home at this time - and Cardiology team also wants the same. Please see your Cardiologist as requested. Return to the Er if the symptoms get worse.   Chest Pain (Nonspecific) It is often hard to give a specific diagnosis for the cause of chest pain. There is always a chance that your pain could be related to something serious, such as a heart attack or a blood clot in the lungs. You need to follow up with your health care provider for further evaluation. CAUSES   Heartburn.  Pneumonia or bronchitis.  Anxiety or stress.  Inflammation around your heart (pericarditis) or lung (pleuritis or pleurisy).  A blood clot in the lung.  A collapsed lung (pneumothorax). It can develop suddenly on its own (spontaneous pneumothorax) or from trauma to the chest.  Shingles infection (herpes zoster virus). The chest wall is composed of bones, muscles, and cartilage. Any of these can be the source of the pain.  The bones can be bruised by injury.  The muscles or cartilage can be strained by coughing or overwork.  The cartilage can be affected by inflammation and become sore (costochondritis). DIAGNOSIS  Lab tests or other studies may be needed to find the cause of your pain. Your health care provider may have you take a test called an ambulatory electrocardiogram (ECG). An ECG records your heartbeat patterns over a 24-hour period. You may also have other tests, such as:  Transthoracic echocardiogram (TTE). During echocardiography, sound waves are used to evaluate how blood flows through your heart.  Transesophageal echocardiogram (TEE).  Cardiac monitoring. This allows your health care provider to monitor your heart rate and rhythm in real time.  Holter monitor. This  is a portable device that records your heartbeat and can help diagnose heart arrhythmias. It allows your health care provider to track your heart activity for several days, if needed.  Stress tests by exercise or by giving medicine that makes the heart beat faster. TREATMENT   Treatment depends on what may be causing your chest pain. Treatment may include:  Acid blockers for heartburn.  Anti-inflammatory medicine.  Pain medicine for inflammatory conditions.  Antibiotics if an infection is present.  You may be advised to change lifestyle habits. This includes stopping smoking and avoiding alcohol, caffeine, and chocolate.  You may be advised to keep your head raised (elevated) when sleeping. This reduces the chance of acid going backward from your stomach into your esophagus. Most of the time, nonspecific chest pain will improve within 2-3 days with rest and mild pain medicine.  HOME CARE INSTRUCTIONS   If antibiotics were prescribed, take them as directed. Finish them even if you start to feel better.  For the next few days, avoid physical activities that bring on chest pain. Continue physical activities as directed.  Do not use any tobacco products, including cigarettes, chewing tobacco, or electronic cigarettes.  Avoid drinking alcohol.  Only take medicine as directed by your health care provider.  Follow your health care provider's suggestions for further testing if your chest pain does not go away.  Keep any follow-up appointments you made. If you do not go to an appointment, you could develop lasting (chronic) problems with pain. If there is any problem keeping an appointment, call to  reschedule. SEEK MEDICAL CARE IF:   Your chest pain does not go away, even after treatment.  You have a rash with blisters on your chest.  You have a fever. SEEK IMMEDIATE MEDICAL CARE IF:   You have increased chest pain or pain that spreads to your arm, neck, jaw, back, or  abdomen.  You have shortness of breath.  You have an increasing cough, or you cough up blood.  You have severe back or abdominal pain.  You feel nauseous or vomit.  You have severe weakness.  You faint.  You have chills. This is an emergency. Do not wait to see if the pain will go away. Get medical help at once. Call your local emergency services (911 in [place] Do not drive yourself to the hospital. MAKE SURE YOU:   Understand these instructions.  Will watch your condition.  Will get help right away if you are not doing well or get worse. Document Released: 07/09/2005 Document Revised: 10/04/2013 Document Reviewed: 05/04/2008 [facility] Patient Information 2015 [place], [place]. This information is not intended to replace advice given to you by your health care provider. Make sure you discuss any questions you have with your health care provider.

## 2014-04-11 NOTE — Telephone Encounter (Signed)
PT WAS IN ER AND WAS TOLD TO FU WITH DOCTOR OR NURSE

## 2014-04-12 ENCOUNTER — Telehealth: Payer: Self-pay

## 2014-04-12 DIAGNOSIS — R079 Chest pain, unspecified: Secondary | ICD-10-CM

## 2014-04-12 NOTE — Telephone Encounter (Signed)
Pt wife called, states pt was seen in ED on Monday, needs a f/u appt in 2-3 days. Please do not call before 2pm.

## 2014-04-12 NOTE — Telephone Encounter (Signed)
Neither you nor Gerald Stabs have any openings.  What do you suggest we do?

## 2014-04-12 NOTE — Telephone Encounter (Signed)
Spoke w/ Dr. Rockey Situ who reviewed pt's records from ED.  He advised that pt be set up for myoview at the hospital to evaluate for blockages.  Pt is hesitant to schedule this, as he reports that he already knows that he has blockages and does not understand the purpose of the stress test.  Discussed purpose of myoview vs cath.  Pt verbalizes understanding and would like to proceed w/ setting this up.  Pt sched for lexiscan 04/18/14 @ 7:30am. Reviewed instructions w/ wife.  She is concerned that pt will not be able to refrain from nicotine & caffeine for 24 hrs prior to test, as he chain smokes and drinks sweet tea throughout the day.

## 2014-04-18 ENCOUNTER — Ambulatory Visit: Payer: Self-pay | Admitting: Cardiovascular Disease

## 2014-04-18 ENCOUNTER — Other Ambulatory Visit: Payer: Self-pay

## 2014-04-18 DIAGNOSIS — R079 Chest pain, unspecified: Secondary | ICD-10-CM

## 2014-04-19 ENCOUNTER — Telehealth: Payer: Self-pay

## 2014-04-19 NOTE — Telephone Encounter (Signed)
Pt wife would like stress test results. please call.

## 2014-04-20 NOTE — Telephone Encounter (Signed)
Pt wife calling back to get stress test results. Please call.  If call made After 4 call. [phone]

## 2014-05-05 ENCOUNTER — Other Ambulatory Visit: Payer: Self-pay | Admitting: *Deleted

## 2014-05-05 DIAGNOSIS — M79602 Pain in left arm: Secondary | ICD-10-CM

## 2014-05-05 MED ORDER — AMLODIPINE BESYLATE 10 MG PO TABS: 10.0000 mg | ORAL_TABLET | Freq: Every day | ORAL | Status: DC

## 2014-05-05 NOTE — Telephone Encounter (Signed)
Requested Prescriptions   Signed Prescriptions Disp Refills  . amLODipine (NORVASC) 10 MG tablet 30 tablet 3    Sig: Take 1 tablet (10 mg total) by mouth daily.    Authorizing Provider: Minna Merritts    Ordering User: Britt Bottom

## 2014-06-08 ENCOUNTER — Other Ambulatory Visit: Payer: Self-pay | Admitting: *Deleted

## 2014-06-08 MED ORDER — CLOPIDOGREL BISULFATE 75 MG PO TABS: 75.0000 mg | ORAL_TABLET | Freq: Every day | ORAL | Status: DC

## 2014-06-08 NOTE — Telephone Encounter (Signed)
Requested Prescriptions   Signed Prescriptions Disp Refills  . clopidogrel (PLAVIX) 75 MG tablet 30 tablet 3    Sig: Take 1 tablet (75 mg total) by mouth daily.    Authorizing Provider: GOLLAN, TIMOTHY J    Ordering User: Karilynn Carranza C    

## 2014-11-06 ENCOUNTER — Other Ambulatory Visit: Payer: Self-pay | Admitting: *Deleted

## 2014-11-06 DIAGNOSIS — M79602 Pain in left arm: Secondary | ICD-10-CM

## 2014-11-06 MED ORDER — AMLODIPINE BESYLATE 10 MG PO TABS: 10.0000 mg | ORAL_TABLET | Freq: Every day | ORAL | Status: DC

## 2014-12-22 ENCOUNTER — Other Ambulatory Visit: Payer: Self-pay | Admitting: Cardiovascular Disease

## 2015-01-09 ENCOUNTER — Ambulatory Visit (INDEPENDENT_AMBULATORY_CARE_PROVIDER_SITE_OTHER): Payer: Managed Care, Other (non HMO) | Admitting: Cardiovascular Disease

## 2015-01-09 ENCOUNTER — Encounter: Payer: Self-pay | Admitting: Cardiovascular Disease

## 2015-01-09 VITALS — BP 152/88 | HR 64 | Ht 70.0 in | Wt 229.2 lb

## 2015-01-09 DIAGNOSIS — Z720 Tobacco use: Secondary | ICD-10-CM

## 2015-01-09 DIAGNOSIS — I25119 Atherosclerotic heart disease of native coronary artery with unspecified angina pectoris: Secondary | ICD-10-CM

## 2015-01-09 DIAGNOSIS — M79602 Pain in left arm: Secondary | ICD-10-CM | POA: Diagnosis not present

## 2015-01-09 DIAGNOSIS — Z955 Presence of coronary angioplasty implant and graft: Secondary | ICD-10-CM

## 2015-01-09 DIAGNOSIS — R079 Chest pain, unspecified: Secondary | ICD-10-CM | POA: Diagnosis not present

## 2015-01-09 DIAGNOSIS — F17200 Nicotine dependence, unspecified, uncomplicated: Secondary | ICD-10-CM

## 2015-01-09 DIAGNOSIS — E785 Hyperlipidemia, unspecified: Secondary | ICD-10-CM

## 2015-01-09 DIAGNOSIS — I10 Essential (primary) hypertension: Secondary | ICD-10-CM

## 2015-01-09 MED ORDER — LOVASTATIN 40 MG PO TABS: 40.0000 mg | ORAL_TABLET | Freq: Every day | ORAL | Status: DC

## 2015-01-09 MED ORDER — CLOPIDOGREL BISULFATE 75 MG PO TABS: 75.0000 mg | ORAL_TABLET | Freq: Every day | ORAL | Status: DC

## 2015-01-09 MED ORDER — AMLODIPINE BESYLATE 10 MG PO TABS: 10.0000 mg | ORAL_TABLET | Freq: Every day | ORAL | Status: DC

## 2015-01-09 MED ORDER — NITROGLYCERIN 0.4 MG SL SUBL: 0.4000 mg | SUBLINGUAL_TABLET | SUBLINGUAL | Status: DC | PRN

## 2015-01-09 NOTE — Assessment & Plan Note (Addendum)
Currently with no symptoms of angina. No further workup at this time. Restart medication regimen. High risk of recurrent coronary disease given continued smoking, medication noncompliance He has not been taking aspirin, let alone Plavix

## 2015-01-09 NOTE — Assessment & Plan Note (Signed)
We have encouraged him to continue to work on weaning his cigarettes and smoking cessation. He will continue to work on this and does not want any assistance with chantix.  
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
## 2015-01-09 NOTE — Assessment & Plan Note (Signed)
Previous stent to the left circumflex. We have recommended he stay on his aspirin and plavix

## 2015-01-09 NOTE — Assessment & Plan Note (Signed)
No significant chest pain on today's visit. He does have chronic mild shortness of breath, continues to smoke

## 2015-01-09 NOTE — Assessment & Plan Note (Signed)
Recommended he restart his lovastatin 40 mg daily Repeat lab work in several months time. Goal LDL less than 70

## 2015-01-09 NOTE — Assessment & Plan Note (Signed)
Recommended he restart his amlodipine 10 mg daily and monitor his blood pressure at home

## 2015-01-09 NOTE — Patient Instructions (Signed)
You are doing well. No medication changes were made.  Please restart your medications  Please call us if you have new issues that need to be addressed before your next appt.  Your physician wants you to follow-up in: 6 months.  You will receive a reminder letter in the mail two months in advance. If you don't receive a letter, please call our office to schedule the follow-up appointment.

## 2015-02-21 ENCOUNTER — Other Ambulatory Visit: Payer: Self-pay | Admitting: *Deleted

## 2015-02-21 MED ORDER — CLOPIDOGREL BISULFATE 75 MG PO TABS: 75.0000 mg | ORAL_TABLET | Freq: Every day | ORAL | Status: DC

## 2015-04-04 ENCOUNTER — Telehealth: Payer: Self-pay | Admitting: Urology

## 2015-04-04 DIAGNOSIS — B009 Herpesviral infection, unspecified: Secondary | ICD-10-CM

## 2015-04-15 NOTE — Telephone Encounter (Signed)
Patient will need his herpes medication called into his pharmacy.

## 2015-04-17 MED ORDER — VALACYCLOVIR HCL 1 G PO TABS: 1000.0000 mg | ORAL_TABLET | Freq: Two times a day (BID) | ORAL | Status: DC

## 2015-04-17 NOTE — Telephone Encounter (Signed)
Medication called into pharmacy. Cw,lpn

## 2015-07-12 ENCOUNTER — Other Ambulatory Visit: Payer: Self-pay

## 2015-07-12 DIAGNOSIS — B009 Herpesviral infection, unspecified: Secondary | ICD-10-CM

## 2015-07-12 MED ORDER — VALACYCLOVIR HCL 1 G PO TABS: 1000.0000 mg | ORAL_TABLET | Freq: Two times a day (BID) | ORAL | Status: DC

## 2015-07-12 NOTE — Progress Notes (Signed)
Pt called requesting a refill for valtrex. Per Larene Beach medication was sent to pt pharmacy.

## 2015-09-03 ENCOUNTER — Telehealth: Payer: Self-pay | Admitting: Cardiovascular Disease

## 2015-09-03 NOTE — Telephone Encounter (Signed)
3 attempts to schedule from recall .  LMOV to call office for scheduling.   Deleting recall.

## 2015-11-07 ENCOUNTER — Other Ambulatory Visit: Payer: Self-pay | Admitting: Cardiovascular Disease

## 2015-11-07 NOTE — Telephone Encounter (Signed)
Patient needs a follow up appointment with Dr. Gollan.  

## 2015-11-08 ENCOUNTER — Telehealth: Payer: Self-pay | Admitting: *Deleted

## 2015-11-08 NOTE — Telephone Encounter (Signed)
lmov to schedule his yearly appointment with Dr Rockey Situ

## 2015-11-23 ENCOUNTER — Encounter: Payer: Self-pay | Admitting: Cardiovascular Disease

## 2015-11-23 ENCOUNTER — Ambulatory Visit (INDEPENDENT_AMBULATORY_CARE_PROVIDER_SITE_OTHER): Payer: Medicare HMO | Admitting: Cardiovascular Disease

## 2015-11-23 VITALS — BP 142/100 | HR 69 | Ht 70.0 in | Wt 214.0 lb

## 2015-11-23 DIAGNOSIS — I25119 Atherosclerotic heart disease of native coronary artery with unspecified angina pectoris: Secondary | ICD-10-CM | POA: Diagnosis not present

## 2015-11-23 DIAGNOSIS — Z720 Tobacco use: Secondary | ICD-10-CM

## 2015-11-23 DIAGNOSIS — I10 Essential (primary) hypertension: Secondary | ICD-10-CM

## 2015-11-23 DIAGNOSIS — F17200 Nicotine dependence, unspecified, uncomplicated: Secondary | ICD-10-CM

## 2015-11-23 DIAGNOSIS — M79602 Pain in left arm: Secondary | ICD-10-CM | POA: Diagnosis not present

## 2015-11-23 DIAGNOSIS — R079 Chest pain, unspecified: Secondary | ICD-10-CM

## 2015-11-23 DIAGNOSIS — E785 Hyperlipidemia, unspecified: Secondary | ICD-10-CM

## 2015-11-23 MED ORDER — VARENICLINE TARTRATE 1 MG PO TABS: 1.0000 mg | ORAL_TABLET | Freq: Two times a day (BID) | ORAL | Status: DC

## 2015-11-23 MED ORDER — CLOPIDOGREL BISULFATE 75 MG PO TABS: 75.0000 mg | ORAL_TABLET | Freq: Every day | ORAL | Status: DC

## 2015-11-23 MED ORDER — ROSUVASTATIN CALCIUM 10 MG PO TABS: 10.0000 mg | ORAL_TABLET | Freq: Every day | ORAL | Status: DC

## 2015-11-23 MED ORDER — AMLODIPINE BESYLATE 10 MG PO TABS: 10.0000 mg | ORAL_TABLET | Freq: Every day | ORAL | Status: DC

## 2015-11-23 NOTE — Patient Instructions (Addendum)
You are doing well.  Start crestor one a day for cholesterol  Quit smoking! Start chantix slowly twice a day  Please call us if you have new issues that need to be addressed before your next appt.  Your physician wants you to follow-up in: 6 months.  You will receive a reminder letter in the mail two months in advance. If you don't receive a letter, please call our office to schedule the follow-up appointment.  Steps to Quit Smoking  Smoking tobacco can be harmful to your health and can affect almost every organ in your body. Smoking puts you, and those around you, at risk for developing many serious chronic diseases. Quitting smoking is difficult, but it is one of the best things that you can do for your health. It is never too late to quit. WHAT ARE THE BENEFITS OF QUITTING SMOKING? When you quit smoking, you lower your risk of developing serious diseases and conditions, such as:  Lung cancer or lung disease, such as COPD.  Heart disease.  Stroke.  Heart attack.  Infertility.  Osteoporosis and bone fractures. Additionally, symptoms such as coughing, wheezing, and shortness of breath may get better when you quit. You may also find that you get sick less often because your body is stronger at fighting off colds and infections. If you are pregnant, quitting smoking can help to reduce your chances of having a baby of low birth weight. HOW DO I GET READY TO QUIT? When you decide to quit smoking, create a plan to make sure that you are successful. Before you quit:  Pick a date to quit. Set a date within the next two weeks to give you time to prepare.  Write down the reasons why you are quitting. Keep this list in places where you will see it often, such as on your bathroom mirror or in your car or wallet.  Identify the people, places, things, and activities that make you want to smoke (triggers) and avoid them. Make sure to take these actions:  Throw away all cigarettes at home, at  work, and in your car.  Throw away smoking accessories, such as Scientist, research (medical).  Clean your car and make sure to empty the ashtray.  Clean your home, including curtains and carpets.  Tell your family, friends, and coworkers that you are quitting. Support from your loved ones can make quitting easier.  Talk with your health care provider about your options for quitting smoking.  Find out what treatment options are covered by your health insurance. WHAT STRATEGIES CAN I USE TO QUIT SMOKING?  Talk with your healthcare provider about different strategies to quit smoking. Some strategies include:  Quitting smoking altogether instead of gradually lessening how much you smoke over a period of time. Research shows that quitting "cold [place]" is more successful than gradually quitting.  Attending in-person counseling to help you build problem-solving skills. You are more likely to have success in quitting if you attend several counseling sessions. Even short sessions of 10 minutes can be effective.  Finding resources and support systems that can help you to quit smoking and remain smoke-free after you quit. These resources are most helpful when you use them often. They can include:  Online chats with a Social worker.  Telephone quitlines.  Printed Furniture conservator/restorer.  Support groups or group counseling.  Text messaging programs.  Mobile phone applications.  Taking medicines to help you quit smoking. (If you are pregnant or breastfeeding, talk with your health care  provider first.) Some medicines contain nicotine and some do not. Both types of medicines help with cravings, but the medicines that include nicotine help to relieve withdrawal symptoms. Your health care provider may recommend:  Nicotine patches, gum, or lozenges.  Nicotine inhalers or sprays.  Non-nicotine medicine that is taken by mouth. Talk with your health care provider about combining strategies, such as taking  medicines while you are also receiving in-person counseling. Using these two strategies together makes you more likely to succeed in quitting than if you used either strategy on its own. If you are pregnant or breastfeeding, talk with your health care provider about finding counseling or other support strategies to quit smoking. Do not take medicine to help you quit smoking unless told to do so by your health care provider. WHAT THINGS CAN I DO TO MAKE IT EASIER TO QUIT? Quitting smoking might feel overwhelming at first, but there is a lot that you can do to make it easier. Take these important actions:  Reach out to your family and friends and ask that they support and encourage you during this time. Call telephone quitlines, reach out to support groups, or work with a counselor for support.  Ask people who smoke to avoid smoking around you.  Avoid places that trigger you to smoke, such as bars, parties, or smoke-break areas at work.  Spend time around people who do not smoke.  Lessen stress in your life, because stress can be a smoking trigger for some people. To lessen stress, try:  Exercising regularly.  Deep-breathing exercises.  Yoga.  Meditating.  Performing a body scan. This involves closing your eyes, scanning your body from head to toe, and noticing which parts of your body are particularly tense. Purposefully relax the muscles in those areas.  Download or purchase mobile phone or tablet apps (applications) that can help you stick to your quit plan by providing reminders, tips, and encouragement. There are many free apps, such as QuitGuide from the [employer] Office manager for Disease Control and Prevention). You can find other support for quitting smoking (smoking cessation) through smokefree.gov and other websites. HOW WILL I FEEL WHEN I QUIT SMOKING? Within the first 24 hours of quitting smoking, you may start to feel some withdrawal symptoms. These symptoms are usually most noticeable  2-3 days after quitting, but they usually do not last beyond 2-3 weeks. Changes or symptoms that you might experience include:  Mood swings.  Restlessness, anxiety, or irritation.  Difficulty concentrating.  Dizziness.  Strong cravings for sugary foods in addition to nicotine.  Mild weight gain.  Constipation.  Nausea.  Coughing or a sore throat.  Changes in how your medicines work in your body.  A depressed mood.  Difficulty sleeping (insomnia). After the first 2-3 weeks of quitting, you may start to notice more positive results, such as:  Improved sense of smell and taste.  Decreased coughing and sore throat.  Slower heart rate.  Lower blood pressure.  Clearer skin.  The ability to breathe more easily.  Fewer sick days. Quitting smoking is very challenging for most people. Do not get discouraged if you are not successful the first time. Some people need to make many attempts to quit before they achieve long-term success. Do your best to stick to your quit plan, and talk with your health care provider if you have any questions or concerns.   This information is not intended to replace advice given to you by your health care provider. Make sure you  discuss any questions you have with your health care provider.   Document Released: 09/23/2001 Document Revised: 02/13/2015 Document Reviewed: 02/13/2015 Elsevier Interactive Patient Education 2016 [employer]. Smoking Hazards Smoking cigarettes is extremely bad for your health. Tobacco smoke has over 200 known poisons in it. It contains the poisonous gases nitrogen oxide and carbon monoxide. There are over 60 chemicals in tobacco smoke that cause cancer. Some of the chemicals found in cigarette smoke include:   Cyanide.   Benzene.   Formaldehyde.   Methanol (wood alcohol).   Acetylene (fuel used in welding torches).   Ammonia.  Even smoking lightly shortens your life expectancy by several years. You can  greatly reduce the risk of medical problems for you and your family by stopping now. Smoking is the most preventable cause of death and disease in our society. Within days of quitting smoking, your circulation improves, you decrease the risk of having a heart attack, and your lung capacity improves. There may be some increased phlegm in the first few days after quitting, and it may take months for your lungs to clear up completely. Quitting for 10 years reduces your risk of developing lung cancer to almost that of a nonsmoker.  WHAT ARE THE RISKS OF SMOKING? Cigarette smokers have an increased risk of many serious medical problems, including:  Lung cancer.   Lung disease (such as pneumonia, bronchitis, and emphysema).   Heart attack and chest pain due to the heart not getting enough oxygen (angina).   Heart disease and peripheral blood vessel disease.   Hypertension.   Stroke.   Oral cancer (cancer of the lip, mouth, or voice box).   Bladder cancer.   Pancreatic cancer.   Cervical cancer.   Pregnancy complications, including premature birth.   Stillbirths and smaller newborn babies, birth defects, and genetic damage to sperm.   Early menopause.   Lower estrogen level for women.   Infertility.   Facial wrinkles.   Blindness.   Increased risk of broken bones (fractures).   Senile dementia.   Stomach ulcers and internal bleeding.   Delayed wound healing and increased risk of complications during surgery. Because of secondhand smoke exposure, children of smokers have an increased risk of the following:   Sudden infant death syndrome (SIDS).   Respiratory infections.   Lung cancer.   Heart disease.   Ear infections.  WHY IS SMOKING ADDICTIVE? Nicotine is the chemical agent in tobacco that is capable of causing addiction or dependence. When you smoke and inhale, nicotine is absorbed rapidly into the bloodstream through your lungs. Both  inhaled and noninhaled nicotine may be addictive.  WHAT ARE THE BENEFITS OF QUITTING?  There are many health benefits to quitting smoking. Some are:   The likelihood of developing cancer and heart disease decreases. Health improvements are seen almost immediately.   Blood pressure, pulse rate, and breathing patterns start returning to normal soon after quitting.   People who quit may see an improvement in their overall quality of life.  HOW DO YOU QUIT SMOKING? Smoking is an addiction with both physical and psychological effects, and longtime habits can be hard to change. Your health care provider can recommend:  Programs and community resources, which may include group support, education, or therapy.  Replacement products, such as patches, gum, and nasal sprays. Use these products only as directed. Do not replace cigarette smoking with electronic cigarettes (commonly called e-cigarettes). The safety of e-cigarettes is unknown, and some may contain harmful chemicals. FOR MORE  INFORMATION  American Lung Association: www.lung.org  American Cancer Society: www.cancer.org   This information is not intended to replace advice given to you by your health care provider. Make sure you discuss any questions you have with your health care provider.   Document Released: 11/06/2004 Document Revised: 07/20/2013 Document Reviewed: 03/21/2013 Elsevier Interactive Patient Education 2016 [place] WHAT IS SECONDHAND SMOKE? Secondhand smoke is smoke that comes from burning tobacco. It could be the smoke from a cigarette, a pipe, or a cigar. Even if you are not the one smoking, secondhand smoke exposes you to the dangers of smoking. This is called involuntary, or passive, smoking. There are two types of secondhand smoke:  Sidestream smoke is the smoke that comes off the lighted end of a cigarette, pipe, or cigar.  This type of smoke has the highest amount of cancer-causing agents  (carcinogens).  The particles in sidestream smoke are smaller. They get into your lungs more easily.  Mainstream smoke is the smoke that is exhaled by a person who is smoking.  This type of smoke is also dangerous to your health. HOW CAN SECONDHAND SMOKE AFFECT MY HEALTH? Studies show that there is no safe level of secondhand smoke. This smoke contains thousands of chemicals. At least 83 of them are known to cause cancer. Secondhand smoke can also cause many other health problems. It has been linked to:  Lung cancer.  Cancer of the voice box (larynx) or throat.  Cancer of the sinuses.  Brain cancer.  Bladder cancer.  Stomach cancer.  Breast cancer.  White blood cell cancers (lymphoma and leukemia).  Brain and liver tumors in children.  Heart disease and stroke in adults.  Pregnancy loss (miscarriage).  Diseases in children, such as:  Asthma.  Lung infections.  Ear infections.  Sudden infant death syndrome (SIDS).  Slow growth. WHERE CAN I BE AT RISK FOR EXPOSURE TO SECONDHAND SMOKE?   For adults, the workplace is the main source of exposure to secondhand smoke.  Your workplace should have a policy separating smoking areas from nonsmoking areas.  Smoking areas should have a system for ventilating and cleaning the air.  For children, the home may be the most dangerous place for exposure to secondhand smoke.  Children who live in apartment buildings may be at risk from smoke drifting from hallways or other people's homes.  For everyone, many public places are possible sources of exposure to secondhand smoke.  These places include restaurants, shopping centers, and parks. HOW CAN I REDUCE MY RISK FOR EXPOSURE TO SECONDHAND SMOKE? The most important thing you can do is not smoke. Discourage family members from smoking. Other ways to reduce exposure for you and your family include the following:  Keep your home smoke free.  Make sure your child care providers  do not smoke.  Warn your child about the dangers of smoking and secondhand smoke.  Do not allow smoking in your car. When someone smokes in a car, all the damaging chemicals from the smoke are confined in a small area.  Avoid public places where smoking is allowed.   This information is not intended to replace advice given to you by your health care provider. Make sure you discuss any questions you have with your health care provider.   Document Released: 11/06/2004 Document Revised: 10/20/2014 Document Reviewed: 01/13/2014 Elsevier Interactive Patient Education [employer].

## 2015-11-23 NOTE — Assessment & Plan Note (Signed)
Atypical type chest pain, rare, not associated with exertion.  Recommend that he call us if symptoms get worse, stress testing will be ordered   Total encounter time more than 25 minutes  Greater than 50% was spent in counseling and coordination of care with the patient

## 2015-11-23 NOTE — Assessment & Plan Note (Signed)
Currently with no symptoms of angina. No further workup at this time.   recommended he start Crestor
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
## 2015-11-23 NOTE — Assessment & Plan Note (Signed)
Cholesterol is at goal on the current lipid regimen. No changes to the medications were made.  

## 2015-11-23 NOTE — Assessment & Plan Note (Signed)
We have provided a prescription for Chantix  Long discussion concerning his smoking and other options for smoking cessation

## 2015-11-23 NOTE — Assessment & Plan Note (Signed)
Blood pressure elevated on today's visit, even on recheck  He reports this was because he was rushing and stressed out this morning , went to the wrong office  Recommended he monitor his blood pressure at home and call our office if this continues to run high

## 2016-03-07 ENCOUNTER — Encounter: Payer: Self-pay | Admitting: *Deleted

## 2016-03-12 ENCOUNTER — Ambulatory Visit (INDEPENDENT_AMBULATORY_CARE_PROVIDER_SITE_OTHER): Payer: Medicare HMO | Admitting: Urology

## 2016-03-12 ENCOUNTER — Encounter: Payer: Self-pay | Admitting: Urology

## 2016-03-12 VITALS — BP 133/79 | HR 60 | Ht 70.0 in | Wt 210.1 lb

## 2016-03-12 DIAGNOSIS — N401 Enlarged prostate with lower urinary tract symptoms: Secondary | ICD-10-CM | POA: Diagnosis not present

## 2016-03-12 DIAGNOSIS — B009 Herpesviral infection, unspecified: Secondary | ICD-10-CM

## 2016-03-12 DIAGNOSIS — N138 Other obstructive and reflux uropathy: Secondary | ICD-10-CM

## 2016-03-12 MED ORDER — VALACYCLOVIR HCL 1 G PO TABS: 1000.0000 mg | ORAL_TABLET | Freq: Every day | ORAL | Status: DC

## 2016-03-12 NOTE — Progress Notes (Signed)
03/12/2016 10:11 AM   Charles Holder Feb 13, 1966 [id number]  Referring provider: Abner Greenspan, MD [address] [place], [place] [address]  Chief Complaint  Patient presents with  . Benign Prostatic Hypertrophy    1 year follow up  . Herpes Zoster    HPI: Patient is a 50 year old Caucasian male with a history of genital herpes and BPH with LUTS who presents today for 1 year follow-up.  Genital herpes Patient had some mild tingling and burning in the genital area but has not seen vesicles.  He is taking the Valtrex 1000 mg daily.  He has recently separated from his wife and is not excited about getting back into the dating scene.  I reminded him that herpes is a STI and to notify sexual partners of his diagnoses.    BPH WITH LUTS His IPSS score today is 5, which is mild lower urinary tract symptomatology. He is mixed with his quality life due to his urinary symptoms. His major complaint today is nocturia x 2.  He has had these symptoms for the last few years.  He denies any dysuria, hematuria or suprapubic pain.  He also denies any recent fevers, chills, nausea or vomiting.  He is adopted.        IPSS      03/12/16 0900       International Prostate Symptom Score   How often have you had the sensation of not emptying your bladder? Not at All     How often have you had to urinate less than every two hours? Less than 1 in 5 times     How often have you found you stopped and started again several times when you urinated? Not at All     How often have you found it difficult to postpone urination? Less than half the time     How often have you had a weak urinary stream? Not at All     How often have you had to strain to start urination? Not at All     How many times did you typically get up at night to urinate? 2 Times     Total IPSS Score 5     Quality of Life due to urinary symptoms   If you were to spend the rest of your life with your urinary  condition just the way it is now how would you feel about that? Mixed        Score:  1-7 Mild 8-19 Moderate 20-35 Severe    PMH: Past Medical History  Diagnosis Date  . Hyperlipidemia   . Hypertension   . Coronary artery disease   . Neuropathy ([place])   . Cardiomyopathy, ischemic 01/22/2011    EF 50%  . Chronic lower back pain     s/p L5-S1  . Diabetes mellitus   . Heart attack ([place])   . Atrial fibrillation ([place])   . Herpes genitalia   . Fatigue   . Headache   . SOB (shortness of breath)   . Anxiety   . ED (erectile dysfunction)   . BPH (benign prostatic hyperplasia)   . Obesity   . Depression   . Hypogonadism in male   . Adopted     Surgical History: Past Surgical History  Procedure Laterality Date  . Coronary angioplasty  01/22/2011    stent  . Back surgeries      x 4    Home Medications:  Medication List       This list is accurate as of: 03/12/16 10:11 AM.  Always use your most recent med list.               amLODipine 10 MG tablet  Commonly known as:  NORVASC  Take 1 tablet (10 mg total) by mouth daily.     aspirin 81 MG EC tablet  Take 81 mg by mouth daily.     clopidogrel 75 MG tablet  Commonly known as:  PLAVIX  Take 1 tablet (75 mg total) by mouth daily.     nitroGLYCERIN 0.4 MG SL tablet  Commonly known as:  NITROSTAT  Place 1 tablet (0.4 mg total) under the tongue every 5 (five) minutes as needed.     rosuvastatin 10 MG tablet  Commonly known as:  CRESTOR  Take 1 tablet (10 mg total) by mouth daily.     valACYclovir 1000 MG tablet  Commonly known as:  VALTREX  Take 1 tablet (1,000 mg total) by mouth daily.     varenicline 1 MG tablet  Commonly known as:  CHANTIX CONTINUING MONTH PAK  Take 1 tablet (1 mg total) by mouth 2 (two) times daily.        Allergies:  Allergies  Allergen Reactions  . Codeine     Makes me sick     Family History: Family History  Problem Relation Age of Onset  . Adopted: Yes    Social  History:  reports that he has been smoking Cigarettes.  He has been smoking about 0.10 packs per day. He has never used smokeless tobacco. He reports that he does not drink alcohol or use illicit drugs.  ROS: UROLOGY Frequent Urination?: Yes Hard to postpone urination?: No Burning/pain with urination?: No Get up at night to urinate?: Yes Leakage of urine?: No Urine stream starts and stops?: No Trouble starting stream?: No Do you have to strain to urinate?: No Blood in urine?: No Urinary tract infection?: No Sexually transmitted disease?: No Injury to kidneys or bladder?: No Painful intercourse?: No Weak stream?: No Erection problems?: No Penile pain?: No  Gastrointestinal Nausea?: No Vomiting?: No Indigestion/heartburn?: No Diarrhea?: No Constipation?: No  Constitutional Fever: No Night sweats?: No Weight loss?: No Fatigue?: No  Skin Skin rash/lesions?: No Itching?: No  Eyes Blurred vision?: No Double vision?: No  Ears/Nose/Throat Sore throat?: No Sinus problems?: No  Hematologic/Lymphatic Swollen glands?: No Easy bruising?: No  Cardiovascular Leg swelling?: No Chest pain?: Yes  Respiratory Cough?: No Shortness of breath?: No  Endocrine Excessive thirst?: No  Musculoskeletal Back pain?: Yes Joint pain?: Yes  Neurological Headaches?: No Dizziness?: No  Psychologic Depression?: Yes Anxiety?: Yes  Physical Exam: BP 133/79 mmHg  Pulse 60  Ht 5\' 10"  (1.778 m)  Wt 210 lb 1.6 oz (95.301 kg)  BMI 30.15 kg/m2  Constitutional: Well nourished. Alert and oriented, No acute distress. HEENT: [place] AT, moist mucus membranes. Trachea midline, no masses. Cardiovascular: No clubbing, cyanosis, or edema. Respiratory: Normal respiratory effort, no increased work of breathing. GI: Abdomen is soft, non tender, non distended, no abdominal masses. Liver and spleen not palpable.  No hernias appreciated.  Stool sample for occult testing is not indicated.   GU:  No CVA tenderness.  No bladder fullness or masses.  Patient with circumcised phallus.   Urethral meatus is patent.  No penile discharge. No penile lesions or rashes. Scrotum without lesions, cysts, rashes and/or edema.  Testicles are located scrotally bilaterally. No masses  are appreciated in the testicles. Left and right epididymis are normal. Rectal: Patient with  normal sphincter tone. Anus and perineum without scarring or rashes. No rectal masses are appreciated. Prostate is approximately 45 grams, no nodules are appreciated. Seminal vesicles are normal. Skin: No rashes, bruises or suspicious lesions. Lymph: No cervical or inguinal adenopathy. Neurologic: Grossly intact, no focal deficits, moving all 4 extremities. Psychiatric: Normal mood and affect.  Laboratory Data: Lab Results  Component Value Date   WBC 11.8* 04/10/2014   HGB 15.1 04/10/2014   HCT 44.4 04/10/2014   MCV 86.0 04/10/2014   PLT 270 04/10/2014    Lab Results  Component Value Date   CREATININE 1.12 04/10/2014    PSA History  1.6 ng/mL on 04/28/2012  1.8 ng/mL on 05/11/2013  2.3 ng/mL on 03/13/2015  Lab Results  Component Value Date   HGBA1C  01/23/2011    5.5 (NOTE)                                                                       According to the ADA Clinical Practice Recommendations for 2011, when HbA1c is used as a screening test:   >=6.5%   Diagnostic of Diabetes Mellitus           (if abnormal result  is confirmed)  5.7-6.4%   Increased risk of developing Diabetes Mellitus  References:Diagnosis and Classification of Diabetes Mellitus,Diabetes [id number] 1):S62-S69 and Standards of Medical Care in         Diabetes - 2011,Diabetes Care,2011,34  (Suppl 1):S11-S61.         Component Value Date/Time   CHOL 177 12/16/2013 0842   CHOL  01/23/2011 0535    161        ATP III CLASSIFICATION:  <200     mg/dL   Desirable  200-239  mg/dL   Borderline High  >=240    mg/dL   High          HDL 30*  12/16/2013 0842   HDL 20* 01/23/2011 0535   CHOLHDL 5.9* 12/16/2013 0842   CHOLHDL 8.1 01/23/2011 0535   VLDL 21 01/23/2011 0535   LDLCALC 117* 12/16/2013 0842   LDLCALC * 01/23/2011 0535    120        Total Cholesterol/HDL:CHD Risk Coronary Heart Disease Risk Table                     Men   Women  1/2 Average Risk   3.4   3.3  Average Risk       5.0   4.4  2 X Average Risk   9.6   7.1  3 X Average Risk  23.4   11.0        Use the calculated Patient Ratio above and the CHD Risk Table to determine the patient's CHD Risk.        ATP III CLASSIFICATION (LDL):  <100     mg/dL   Optimal  100-129  mg/dL   Near or Above                    Optimal  130-159  mg/dL   Borderline  160-189  mg/dL   High  >190  mg/dL   Very High    Lab Results  Component Value Date   AST 21 01/22/2011   Lab Results  Component Value Date   ALT 24 01/22/2011      Assessment & Plan:    1. BPH (benign prostatic hyperplasia) with LUTS:   IPSS score is 5/3.  Major complaint is nocturia.  Does not want to start a medication at this time.  RTC in one year for IPSS, PSA and exam.  - PSA  2. Nocturia:   I explained to the patient that nocturia is often multi-factorial and difficult to treat.  Sleeping disorders, heart conditions and peripheral vascular disease, diabetes,  enlarged prostate or urethral stricture causing bladder outlet obstruction and/or certain medications.  I have suggested that the patient avoid caffeine and alcohol in the evening. He may also benefit from fluid restrictions after 6:00 in the evening and voiding just prior to bedtime.  The patient may also benefit from a discussion with his primary care physician to see if he has risk factors for sleep apnea or other sleep disturbances and obtaining a sleep study.  3. Genital herpes:   Refill given on Valtrex.  RTC in one year.  Engage in protective intercourse.    Return for IPSS, PSA and exam.  These notes generated with voice  recognition software. I apologize for typographical errors.  Zara Council, [facility] [address], [place], [place] [address] [phone]

## 2016-03-13 ENCOUNTER — Telehealth: Payer: Self-pay

## 2016-03-13 LAB — PSA: Prostate Specific Ag, Serum: 5.2 ng/mL — ABNORMAL HIGH (ref 0.0–4.0)

## 2016-03-13 NOTE — Telephone Encounter (Signed)
-----   Message from Nori Riis, PA-C sent at 03/13/2016  8:19 AM EDT ----- Patient's PSA has doubled.  I suggest repeating the PSA to confirm the value.

## 2016-03-13 NOTE — Telephone Encounter (Signed)
No vm set up. 

## 2016-03-13 NOTE — Telephone Encounter (Signed)
No answer

## 2016-03-14 DIAGNOSIS — B009 Herpesviral infection, unspecified: Secondary | ICD-10-CM | POA: Insufficient documentation

## 2016-03-14 DIAGNOSIS — C61 Malignant neoplasm of prostate: Secondary | ICD-10-CM | POA: Insufficient documentation

## 2016-03-17 NOTE — Telephone Encounter (Signed)
No answer- will send a letter.

## 2016-03-24 ENCOUNTER — Telehealth: Payer: Self-pay

## 2016-03-24 NOTE — Telephone Encounter (Signed)
Pt called in reference to letter that was sent. Reinforced with pt the PSA has doubled. Pt stated that money right now is a problem. Reinforced with pt he can wait a few weeks but not to long because if there is a problem we want to catch it before it gets bad. Pt voiced understanding stating he will call back to make lab appt when hes able to figure money issues out.

## 2016-04-10 ENCOUNTER — Other Ambulatory Visit: Payer: Self-pay

## 2016-04-10 DIAGNOSIS — N400 Enlarged prostate without lower urinary tract symptoms: Secondary | ICD-10-CM

## 2016-04-11 ENCOUNTER — Other Ambulatory Visit: Payer: Medicare HMO

## 2016-04-11 DIAGNOSIS — N400 Enlarged prostate without lower urinary tract symptoms: Secondary | ICD-10-CM

## 2016-04-12 LAB — PSA: PROSTATE SPECIFIC AG, SERUM: 3.4 ng/mL (ref 0.0–4.0)

## 2016-04-23 ENCOUNTER — Telehealth: Payer: Self-pay | Admitting: *Deleted

## 2016-04-23 NOTE — Telephone Encounter (Signed)
Tried to call patient twice, something wrong with phone kept going in and out. I never spoke to him where we could understand each other.

## 2016-04-23 NOTE — Telephone Encounter (Signed)
-----   Message from Nori Riis, PA-C sent at 04/13/2016  6:10 PM EDT ----- PSA has declined.  It has not yet returned to baseline, I would like it repeated in 6 months.

## 2016-04-23 NOTE — Telephone Encounter (Signed)
Spoke with patient and gave results. Patient transferred to [facility] to make follow up appointment on PSA in 6 months.

## 2016-05-19 ENCOUNTER — Telehealth: Payer: Self-pay

## 2016-05-19 NOTE — Telephone Encounter (Signed)
Pt called stating he is currently divorced from wife of 44yrs and now has a girlfriend. Pt stated that last night he disclosed the herpes information to girlfriend. Pt requested Larene Beach give girlfriend a call.  Per Larene Beach pt will need to sign a medical release form for her to speak with girlfriend. Pt voiced understanding stating he will come to office now. Girlfriend-Theresa [phone]

## 2016-08-07 ENCOUNTER — Encounter: Payer: Self-pay | Admitting: Urology

## 2016-08-07 ENCOUNTER — Ambulatory Visit: Payer: Medicare HMO | Admitting: Urology

## 2016-08-07 VITALS — BP 154/79 | HR 73 | Ht 70.0 in | Wt 203.0 lb

## 2016-08-07 DIAGNOSIS — N401 Enlarged prostate with lower urinary tract symptoms: Secondary | ICD-10-CM | POA: Diagnosis not present

## 2016-08-07 DIAGNOSIS — B009 Herpesviral infection, unspecified: Secondary | ICD-10-CM

## 2016-08-07 DIAGNOSIS — R972 Elevated prostate specific antigen [PSA]: Secondary | ICD-10-CM

## 2016-08-07 DIAGNOSIS — N138 Other obstructive and reflux uropathy: Secondary | ICD-10-CM

## 2016-08-07 DIAGNOSIS — R351 Nocturia: Secondary | ICD-10-CM | POA: Diagnosis not present

## 2016-08-07 DIAGNOSIS — N400 Enlarged prostate without lower urinary tract symptoms: Secondary | ICD-10-CM

## 2016-08-07 LAB — BLADDER SCAN AMB NON-IMAGING: SCAN RESULT: 0

## 2016-08-07 MED ORDER — VALACYCLOVIR HCL 1 G PO TABS: 1000.0000 mg | ORAL_TABLET | Freq: Every day | ORAL | 4 refills | Status: DC

## 2016-08-07 NOTE — Progress Notes (Signed)
08/07/2016 9:44 PM   Charles Holder 1965-11-10 [id number]  Referring provider: Abner Greenspan, MD [address] [place], [place] [address]  Chief Complaint  Patient presents with  . Benign Prostatic Hypertrophy    6 month follow up    HPI: Patient is a 50 year old Caucasian male with a history of genital herpes and BPH with LUTS who presents today for 6 month  follow-up for a rise in PSA velocity.    Increase in PSA velocity PSA had risen from 2.3 in 02/2015 to 5.2 in 02/2016.  A repeated PSA was found to be 3.4.  He is here for follow up.    Genital herpes Patient had some mild tingling and burning in the genital area but has not seen vesicles.  He is taking the Valtrex 1000 mg daily.  He has recently separated from his wife and is not excited about getting back into the dating scene.  I reminded him that herpes is a STI and to notify sexual partners of his diagnoses.  I have spoken personally to one of his sexual partners concerning his herpes.  BPH WITH LUTS His IPSS score today is 9, which is moderate lower urinary tract symptomatology.  He is mixed with his quality life due to his urinary symptoms.  His previous IPSS was 5/3.  His major complaints today are nocturia x 2 and frequency.  He has had these symptoms for the last few years.  He denies any dysuria, hematuria or suprapubic pain.  He also denies any recent fevers, chills, nausea or vomiting.  He is adopted.        IPSS    Row Name 08/07/16 1400         International Prostate Symptom Score   How often have you had the sensation of not emptying your bladder? Not at All     How often have you had to urinate less than every two hours? Almost always     How often have you found you stopped and started again several times when you urinated? Not at All     How often have you found it difficult to postpone urination? Less than 1 in 5 times     How often have you had a weak urinary stream? Less than 1 in 5 times      How often have you had to strain to start urination? Not at All     How many times did you typically get up at night to urinate? 2 Times     Total IPSS Score 9       Quality of Life due to urinary symptoms   If you were to spend the rest of your life with your urinary condition just the way it is now how would you feel about that? Mixed        Score:  1-7 Mild 8-19 Moderate 20-35 Severe    PMH: Past Medical History:  Diagnosis Date  . Adopted   . Anxiety   . Atrial fibrillation ([place])   . BPH (benign prostatic hyperplasia)   . Cardiomyopathy, ischemic 01/22/2011   EF 50%  . Chronic lower back pain    s/p L5-S1  . Coronary artery disease   . Depression   . Diabetes mellitus   . ED (erectile dysfunction)   . Fatigue   . Headache   . Heart attack   . Herpes genitalia   . Hyperlipidemia   . Hypertension   .  Hypogonadism in male   . Neuropathy ([place])   . Obesity   . SOB (shortness of breath)     Surgical History: Past Surgical History:  Procedure Laterality Date  . back surgeries     x 4  . CORONARY ANGIOPLASTY  01/22/2011   stent    Home Medications:    Medication List       Accurate as of 08/07/16 11:59 PM. Always use your most recent med list.          amLODipine 10 MG tablet Commonly known as:  NORVASC Take 1 tablet (10 mg total) by mouth daily.   aspirin 81 MG EC tablet Take 81 mg by mouth daily.   clopidogrel 75 MG tablet Commonly known as:  PLAVIX Take 1 tablet (75 mg total) by mouth daily.   nitroGLYCERIN 0.4 MG SL tablet Commonly known as:  NITROSTAT Place 1 tablet (0.4 mg total) under the tongue every 5 (five) minutes as needed.   valACYclovir 1000 MG tablet Commonly known as:  VALTREX Take 1 tablet (1,000 mg total) by mouth daily.       Allergies:  Allergies  Allergen Reactions  . Codeine     Makes me sick     Family History: Family History  Problem Relation Age of Onset  . Adopted: Yes    Social History:  reports  that he has been smoking Cigarettes.  He has been smoking about 0.75 packs per day. He has never used smokeless tobacco. He reports that he does not drink alcohol or use drugs.  ROS: UROLOGY Frequent Urination?: Yes Hard to postpone urination?: No Burning/pain with urination?: No Get up at night to urinate?: Yes Leakage of urine?: No Urine stream starts and stops?: No Trouble starting stream?: No Do you have to strain to urinate?: No Blood in urine?: No Urinary tract infection?: No Sexually transmitted disease?: Yes Injury to kidneys or bladder?: No Painful intercourse?: No Weak stream?: No Erection problems?: No Penile pain?: No  Gastrointestinal Nausea?: No Vomiting?: No Indigestion/heartburn?: No Diarrhea?: No Constipation?: No  Constitutional Fever: No Night sweats?: Yes Weight loss?: No Fatigue?: Yes  Skin Skin rash/lesions?: No Itching?: No  Eyes Blurred vision?: No Double vision?: No  Ears/Nose/Throat Sore throat?: No Sinus problems?: No  Hematologic/Lymphatic Swollen glands?: No Easy bruising?: No  Cardiovascular Leg swelling?: No Chest pain?: Yes  Respiratory Cough?: No Shortness of breath?: No  Endocrine Excessive thirst?: No  Musculoskeletal Back pain?: Yes Joint pain?: Yes  Neurological Headaches?: No Dizziness?: No  Psychologic Depression?: Yes Anxiety?: Yes  Physical Exam: BP (!) 154/79   Pulse 73   Ht 5\' 10"  (1.778 m)   Wt 203 lb (92.1 kg)   BMI 29.13 kg/m   Constitutional: Well nourished. Alert and oriented, No acute distress. HEENT: [place] AT, moist mucus membranes. Trachea midline, no masses. Cardiovascular: No clubbing, cyanosis, or edema. Respiratory: Normal respiratory effort, no increased work of breathing. GI: Abdomen is soft, non tender, non distended, no abdominal masses. Liver and spleen not palpable.  No hernias appreciated.  Stool sample for occult testing is not indicated.   GU: No CVA tenderness.  No  bladder fullness or masses.  Patient with circumcised phallus.   Urethral meatus is patent.  No penile discharge. No penile lesions or rashes. Scrotum without lesions, cysts, rashes and/or edema.  Testicles are located scrotally bilaterally. No masses are appreciated in the testicles. Left and right epididymis are normal. Rectal: Patient with  normal sphincter tone. Anus and  perineum without scarring or rashes. No rectal masses are appreciated. Prostate is approximately 45 grams, no nodules are appreciated. Seminal vesicles are normal. Skin: No rashes, bruises or suspicious lesions. Lymph: No cervical or inguinal adenopathy. Neurologic: Grossly intact, no focal deficits, moving all 4 extremities. Psychiatric: Normal mood and affect.  Laboratory Data: Lab Results  Component Value Date   WBC 11.8 (H) 04/10/2014   HGB 15.1 04/10/2014   HCT 44.4 04/10/2014   MCV 86.0 04/10/2014   PLT 270 04/10/2014    Lab Results  Component Value Date   CREATININE 1.12 04/10/2014    PSA History  1.6 ng/mL on 04/28/2012  1.8 ng/mL on 05/11/2013  2.3 ng/mL on 03/13/2015  5.2 ng/mL on 03/12/2016  3.4 ng/mL on 04/11/2016  Lab Results  Component Value Date   HGBA1C  01/23/2011    5.5 (NOTE)                                                                       According to the ADA Clinical Practice Recommendations for 2011, when HbA1c is used as a screening test:   >=6.5%   Diagnostic of Diabetes Mellitus           (if abnormal result  is confirmed)  5.7-6.4%   Increased risk of developing Diabetes Mellitus  References:Diagnosis and Classification of Diabetes Mellitus,Diabetes S8098542 1):S62-S69 and Standards of Medical Care in         Diabetes - 2011,Diabetes Care,2011,34  (Suppl 1):S11-S61.         Component Value Date/Time   CHOL 177 12/16/2013 0842   HDL 30 (L) 12/16/2013 0842   CHOLHDL 5.9 (H) 12/16/2013 0842   CHOLHDL 8.1 01/23/2011 0535   VLDL 21 01/23/2011 0535   LDLCALC 117  (H) 12/16/2013 0842    Lab Results  Component Value Date   AST 21 01/22/2011   Lab Results  Component Value Date   ALT 24 01/22/2011      Assessment & Plan:    1. Rise in PSA velocity  - PSA had a greater than 0.75 over one year  - Repeated PSA demonstrated a downward trend  - PSA drawn today to ensure the downward trend continues    2. BPH with LUTS  - IPSS score is 9/3, it is worsening  - Continue conservative management, avoiding bladder irritants and timed voiding's  - PSA   - RTC pending PSA results  3. Nocturia  - I explained to the patient that nocturia is often multi-factorial and difficult to treat.  Sleeping disorders, heart conditions, peripheral vascular disease, diabetes, an enlarged prostate for men, an urethral stricture causing bladder outlet obstruction and/or certain medications can contribute to nocturia.  - I have suggested that the patient avoid caffeine after noon and alcohol in the evening.  He or she may also benefit from fluid restrictions after 6:00 in the evening and voiding just prior to bedtime.  - I have explained that research studies have showed that over 84% of patients with sleep apnea reported frequent nighttime urination.   With sleep apnea, oxygen decreases, carbon dioxide increases, the blood become more acidic, the heart rate drops and blood vessels in the lung constrict.  The body is then alerted that  something is very wrong. The sleeper must wake enough to reopen the airway. By this time, the heart is racing and experiences a false signal of fluid overload. The heart excretes a hormone-like protein that tells the body to get rid of sodium and water, resulting in nocturia.  -  I also informed the patient that a recent study noted that decreasing sodium intake to 2.3 grams daily, if they don't have issues with hyponatremia, can also reduce the number of nightly voids  - The patient may benefit from a discussion with his or her primary care  physician to see if he or she has risk factors for sleep apnea or other sleep disturbances and obtaining a sleep study.  4. Genital herpes:   Refill given on Valtrex.   Engage in protective intercourse.    Return for pending PSA.  These notes generated with voice recognition software. I apologize for typographical errors.  Zara Council, PA-C  [facility] [address] [place], [place] [address] [phone]  Addendum:   PSA returned at 5.4 ng/mL.  Free and total PSA demonstrated a 56% probability of having prostate cancer at this time. I would suggest that the patient undergo a prostate biopsy at this time.  He is on aspirin and Plavix, so he will need cardiac clearance prior to stopping these medications for the biopsy.

## 2016-08-08 ENCOUNTER — Encounter: Payer: Self-pay | Admitting: Urology

## 2016-08-08 ENCOUNTER — Telehealth: Payer: Self-pay

## 2016-08-08 LAB — PSA: PROSTATE SPECIFIC AG, SERUM: 5.4 ng/mL — AB (ref 0.0–4.0)

## 2016-08-08 NOTE — Telephone Encounter (Signed)
-----   Message from Nori Riis, PA-C sent at 08/08/2016  8:20 AM EDT ----- Please add a free and total PSA to his blood work.

## 2016-08-08 NOTE — Telephone Encounter (Signed)
Labs added.

## 2016-08-11 ENCOUNTER — Telehealth: Payer: Self-pay

## 2016-08-11 ENCOUNTER — Telehealth: Payer: Self-pay | Admitting: Cardiovascular Disease

## 2016-08-11 NOTE — Telephone Encounter (Signed)
No vm set up. 

## 2016-08-11 NOTE — Telephone Encounter (Signed)
Please schedule 30 minute f/u with me, thanks

## 2016-08-11 NOTE — Telephone Encounter (Signed)
-----   Message from Nori Riis, PA-C sent at 08/10/2016  9:47 PM EDT ----- Please notify the patient that according to his blood work he has a 56% probability of having prostate cancer at this time.  I would suggest that the patient undergo a prostate biopsy at this time.  Please remind him of the risks of the prostate biopsy such as blood in urine, blood in stool, blood in semen, infection, urinary retention, and on rare occasions sepsis and death.  He is on aspirin and Plavix, so he will need cardiac clearance prior to stopping these medications for the biopsy.

## 2016-08-11 NOTE — Telephone Encounter (Signed)
Pt calling stating that today he's been having some steady arm pains Denies SOB, has chest pain the past couple weeks  But he states that's normal Just pain he states BP was taken (one day last week) estimated 154/79  Would like some advise.

## 2016-08-11 NOTE — Telephone Encounter (Signed)
Spoke w/ pt.  He reports chest pain and rt arm pain.  Pt denies SOB, n/v or diaphoresis. He states that he felt fine before his last heart attack.  He states that he "has these weird pains all the time and Dr. Rockey Situ knows about them". He recently separated from his wife and is under a lot of stress. He does not have anti-anxiety meds. He reports back pain and trouble sleeping.  Pt states that he does not take pain meds despite the fact that he is in so much pain.  Per Dr. Rockey Situ, pt's sx are chronic and most likely stress related.  He recommends pt contact is PCP to discuss meds and/or stress reducing techniques. Pt is sched to see Dr. Rockey Situ 08/26/16 @ 7:40. (offered pt sooner appt w/ PA, but he declines, as he does not want to see a "foreigner"). Pt requests that I send this message to Dr. Marliss Coots office and see if an appt can be set up to see her, as he has not seen her in several years and fears he may have difficulty getting an appt, as "a lot of people don't believe me". Advised pt to proceed to the ED or call 911 if his sx become emergent.

## 2016-08-12 NOTE — Telephone Encounter (Signed)
Appointment 11/6

## 2016-08-12 NOTE — Telephone Encounter (Signed)
Pt scheduled appt

## 2016-08-13 NOTE — Telephone Encounter (Signed)
I have spoken with the patient today and schd his biopsy for 09-01-16. I have explained the biopsy instructions to the patient over the phone. Advised him not to stop his aspirin or plavix until Vikki Ports has gotten the clearance from dr. Rockey Situ. I have mailed the instructions to the patient. He had no further questions at this time.  Sharyn Lull

## 2016-08-14 ENCOUNTER — Telehealth: Payer: Self-pay | Admitting: Cardiovascular Disease

## 2016-08-14 NOTE — Telephone Encounter (Signed)
Received cardiac clearance request for pt to proceed w/ prostate biopsy. Date of procedure has not been scheduled pending this clearance.  Urology requests permission to hold aspirin & Plavix 7 days prior to bx. Please route clearance & recommendations to [facility] [phone].

## 2016-08-14 NOTE — Telephone Encounter (Signed)
Acceptable risk for biopsy of prostate Okay to hold Plavix prior to procedure Would prefer he stay on aspirin but if this must be held for the procedure, few other choices other than to hold the aspirin

## 2016-08-15 NOTE — Telephone Encounter (Signed)
Routed to fax # provided. 

## 2016-08-17 LAB — PSA, TOTAL AND FREE
PSA FREE PCT: 6.7 %
PSA, Free: 0.36 ng/mL
Prostate Specific Ag, Serum: 5.4 ng/mL — ABNORMAL HIGH (ref 0.0–4.0)

## 2016-08-17 LAB — SPECIMEN STATUS REPORT

## 2016-08-18 ENCOUNTER — Ambulatory Visit (INDEPENDENT_AMBULATORY_CARE_PROVIDER_SITE_OTHER): Payer: Medicare HMO | Admitting: Family Medicine

## 2016-08-18 ENCOUNTER — Encounter: Payer: Self-pay | Admitting: Family Medicine

## 2016-08-18 VITALS — BP 130/78 | HR 65 | Temp 98.1°F | Ht 69.5 in | Wt 197.5 lb

## 2016-08-18 DIAGNOSIS — E7800 Pure hypercholesterolemia, unspecified: Secondary | ICD-10-CM

## 2016-08-18 DIAGNOSIS — F17200 Nicotine dependence, unspecified, uncomplicated: Secondary | ICD-10-CM

## 2016-08-18 DIAGNOSIS — G8929 Other chronic pain: Secondary | ICD-10-CM

## 2016-08-18 DIAGNOSIS — F419 Anxiety disorder, unspecified: Secondary | ICD-10-CM | POA: Insufficient documentation

## 2016-08-18 DIAGNOSIS — M5440 Lumbago with sciatica, unspecified side: Secondary | ICD-10-CM

## 2016-08-18 DIAGNOSIS — R079 Chest pain, unspecified: Secondary | ICD-10-CM

## 2016-08-18 DIAGNOSIS — R972 Elevated prostate specific antigen [PSA]: Secondary | ICD-10-CM | POA: Insufficient documentation

## 2016-08-18 DIAGNOSIS — F418 Other specified anxiety disorders: Secondary | ICD-10-CM

## 2016-08-18 DIAGNOSIS — I10 Essential (primary) hypertension: Secondary | ICD-10-CM

## 2016-08-18 LAB — COMPREHENSIVE METABOLIC PANEL
ALT: 18 U/L (ref 0–53)
AST: 17 U/L (ref 0–37)
Albumin: 4.3 g/dL (ref 3.5–5.2)
Alkaline Phosphatase: 74 U/L (ref 39–117)
BUN: 14 mg/dL (ref 6–23)
CHLORIDE: 104 meq/L (ref 96–112)
CO2: 30 mEq/L (ref 19–32)
Calcium: 10.1 mg/dL (ref 8.4–10.5)
Creatinine, Ser: 1.21 mg/dL (ref 0.40–1.50)
GFR: 66.94 mL/min (ref >60.00)
GLUCOSE: 94 mg/dL (ref 70–99)
POTASSIUM: 3.7 meq/L (ref 3.5–5.1)
SODIUM: 141 meq/L (ref 135–145)
Total Bilirubin: 0.4 mg/dL (ref 0.2–1.2)
Total Protein: 7 g/dL (ref 6.0–8.3)

## 2016-08-18 LAB — TSH: TSH: 1.8 u[IU]/mL (ref 0.35–4.50)

## 2016-08-18 LAB — CBC WITH DIFFERENTIAL/PLATELET
BASOS PCT: 0.4 % (ref 0.0–3.0)
Basophils Absolute: 0.1 10*3/uL (ref 0.0–0.1)
Eosinophils Absolute: 0.1 10*3/uL (ref 0.0–0.7)
Eosinophils Relative: 0.8 % (ref 0.0–5.0)
HCT: 47.8 % (ref 39.0–52.0)
Hemoglobin: 16.5 g/dL (ref 13.0–17.0)
Lymphocytes Relative: 22 % (ref 12.0–46.0)
Lymphs Abs: 3.3 10*3/uL (ref 0.7–4.0)
MCHC: 34.5 g/dL (ref 30.0–36.0)
MCV: 89.9 fl (ref 78.0–100.0)
MONO ABS: 0.9 10*3/uL (ref 0.1–1.0)
Monocytes Relative: 6.1 % (ref 3.0–12.0)
NEUTROS ABS: 10.7 10*3/uL — AB (ref 1.4–7.7)
Neutrophils Relative %: 70.7 % (ref 43.0–77.0)
Platelets: 343 10*3/uL (ref 150.0–400.0)
RBC: 5.31 Mil/uL (ref 4.22–5.81)
RDW: 14.5 % (ref 11.5–15.5)
WBC: 15.1 10*3/uL — ABNORMAL HIGH (ref 4.0–10.5)

## 2016-08-18 LAB — LIPID PANEL
CHOL/HDL RATIO: 7
Cholesterol: 202 mg/dL — ABNORMAL HIGH (ref 0–200)
HDL: 28.5 mg/dL — ABNORMAL LOW (ref >39.00)
LDL Cholesterol: 133 mg/dL — ABNORMAL HIGH (ref 0–99)
NONHDL: 173.36
Triglycerides: 200 mg/dL — ABNORMAL HIGH (ref 0.0–149.0)
VLDL: 40 mg/dL (ref 0.0–40.0)

## 2016-08-18 MED ORDER — SERTRALINE HCL 50 MG PO TABS: 50.0000 mg | ORAL_TABLET | Freq: Every day | ORAL | 5 refills | Status: DC

## 2016-08-18 NOTE — Assessment & Plan Note (Signed)
In pt with many back surgeries in the past  No meds currently Has neuro symptoms  Last ortho was Dr Jacinto Reap return  Urged to consider pain med in the future He is disabled and not able to do much

## 2016-08-18 NOTE — Assessment & Plan Note (Signed)
Pt has seen cardiology (known CAD) and dx with atypical cp-likely from stress/stress reaction  See a/p for mood

## 2016-08-18 NOTE — Progress Notes (Signed)
Pre visit review using our clinic review tool, if applicable. No additional management support is needed unless otherwise documented below in the visit note. 

## 2016-08-18 NOTE — Assessment & Plan Note (Signed)
Disc in detail risks of smoking and possible outcomes including copd, vascular/ heart disease, cancer , respiratory and sinus infections  Pt voices understanding He is not yet ready to quit - has cut back to 1/2 ppd Of note-has had STEMI in the past as well  No known copd
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
## 2016-08-18 NOTE — Patient Instructions (Addendum)
Keep talking to friends  Try writing in a journal a bit each day  Stay active when you can / get outdoors when you can-even for short periods  Be social when you can  Start sertraline at 1/2 pill of the 50 mg tablet for 2 weeks and then increase to a whole tablet daily  Take it in the evening  If any intolerable side effects -stop the medicine and let me know  If you feel worse / more depressed or suicidal stop it and let me know  You may feel weird/ spacy/ sleepy or nauseated for 1-2 weeks   Labs today   Follow up with me in 4-6 weeks

## 2016-08-18 NOTE — Assessment & Plan Note (Signed)
bp in fair control at this time  BP Readings from Last 1 Encounters:  08/18/16 130/78   No changes needed Disc lifstyle change with low sodium diet and exercise  On amlodipine currently from cardiology  Pt has known CAD and smokes

## 2016-08-18 NOTE — Assessment & Plan Note (Signed)
Ongoing in pt with known CAD (stent) and also smoker  He is intolerant of statins and declines medication  Disc diet (low appetite lately) Rev last lipid panel Check lipids today  Rev low trans/sat fat diet

## 2016-08-19 ENCOUNTER — Telehealth: Payer: Self-pay

## 2016-08-19 NOTE — Telephone Encounter (Signed)
Clearance was obtained for pt to stop plavix and ASA 7 days prior to prostate bx.

## 2016-08-20 ENCOUNTER — Telehealth: Payer: Self-pay | Admitting: Family Medicine

## 2016-08-20 ENCOUNTER — Encounter: Payer: Self-pay | Admitting: Family Medicine

## 2016-08-20 DIAGNOSIS — D72829 Elevated white blood cell count, unspecified: Secondary | ICD-10-CM | POA: Insufficient documentation

## 2016-08-20 NOTE — Telephone Encounter (Signed)
Please see prev note on pt regarding pt's labs

## 2016-08-20 NOTE — Telephone Encounter (Signed)
Patient returned Shapale's call about his lab results. Patient said he hasn't had an infection and he hasn't been taking steriods. Patient asked for Shapale to return his call.

## 2016-08-20 NOTE — Telephone Encounter (Signed)
Thanks for touching base This looks like it has been elevated for a while-so not as concerning but it does warrant discussion Please send copy to his urologist We will review it further at his f/u appt in Dec and I may repeat it with a few extra labs and a urinalysis  His last cxr was good  If he develops a fever or new symptoms in the meantime please alert me

## 2016-08-21 NOTE — Telephone Encounter (Signed)
Pt notified of Dr. Glori Bickers comments and verbalized understanding, lab results sent to Dr. Ernestine Conrad

## 2016-08-24 ENCOUNTER — Encounter: Payer: Self-pay | Admitting: Cardiovascular Disease

## 2016-08-26 ENCOUNTER — Encounter: Payer: Self-pay | Admitting: Cardiovascular Disease

## 2016-08-26 ENCOUNTER — Ambulatory Visit: Payer: Medicare HMO | Admitting: Cardiovascular Disease

## 2016-08-26 ENCOUNTER — Ambulatory Visit (INDEPENDENT_AMBULATORY_CARE_PROVIDER_SITE_OTHER): Payer: Medicare HMO | Admitting: Cardiovascular Disease

## 2016-08-26 VITALS — BP 116/78 | HR 65 | Ht 69.0 in | Wt 202.0 lb

## 2016-08-26 DIAGNOSIS — F17200 Nicotine dependence, unspecified, uncomplicated: Secondary | ICD-10-CM

## 2016-08-26 DIAGNOSIS — I25119 Atherosclerotic heart disease of native coronary artery with unspecified angina pectoris: Secondary | ICD-10-CM

## 2016-08-26 DIAGNOSIS — E7800 Pure hypercholesterolemia, unspecified: Secondary | ICD-10-CM | POA: Diagnosis not present

## 2016-08-26 DIAGNOSIS — I10 Essential (primary) hypertension: Secondary | ICD-10-CM

## 2016-08-26 DIAGNOSIS — M5440 Lumbago with sciatica, unspecified side: Secondary | ICD-10-CM | POA: Diagnosis not present

## 2016-08-26 DIAGNOSIS — R972 Elevated prostate specific antigen [PSA]: Secondary | ICD-10-CM

## 2016-08-26 DIAGNOSIS — G8929 Other chronic pain: Secondary | ICD-10-CM

## 2016-08-26 MED ORDER — ROSUVASTATIN CALCIUM 5 MG PO TABS: 5.0000 mg | ORAL_TABLET | Freq: Every day | ORAL | 3 refills | Status: DC
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
## 2016-08-26 NOTE — Patient Instructions (Signed)
Medication Instructions:   Please start crestor 5 mg daily for cholesterol  Labwork:  No new labs needed  Testing/Procedures:  No further testing at this time   I recommend watching educational videos on topics of interest to you at:       [email or web address]  Enter code: [email or web address]    Follow-Up: It was a pleasure seeing you in the office today. Please call us if you have new issues that need to be addressed before your next appt.  [phone]  Your physician wants you to follow-up in: 6 months.  You will receive a reminder letter in the mail two months in advance. If you don't receive a letter, please call our office to schedule the follow-up appointment.  If you need a refill on your cardiac medications before your next appointment, please call your pharmacy.

## 2016-09-01 ENCOUNTER — Other Ambulatory Visit: Payer: Medicare HMO

## 2016-09-01 ENCOUNTER — Encounter: Payer: Self-pay | Admitting: Urology

## 2016-09-01 ENCOUNTER — Other Ambulatory Visit: Payer: Self-pay | Admitting: Urology

## 2016-09-01 ENCOUNTER — Ambulatory Visit: Payer: Medicare HMO | Admitting: Urology

## 2016-09-01 VITALS — BP 148/85 | HR 55 | Ht 69.0 in | Wt 197.8 lb

## 2016-09-01 DIAGNOSIS — R972 Elevated prostate specific antigen [PSA]: Secondary | ICD-10-CM

## 2016-09-01 MED ORDER — GENTAMICIN SULFATE 40 MG/ML IJ SOLN
80.0000 mg | Freq: Once | INTRAMUSCULAR | Status: AC
  Administered 2016-09-01: 80 mg via INTRAMUSCULAR

## 2016-09-01 MED ORDER — LEVOFLOXACIN 500 MG PO TABS
500.0000 mg | ORAL_TABLET | Freq: Once | ORAL | Status: AC
  Administered 2016-09-01: 500 mg via ORAL

## 2016-09-01 NOTE — Progress Notes (Signed)
Prostate Biopsy Procedure   Informed consent was obtained after discussing risks/benefits of the procedure.  A time out was performed to ensure correct patient identity.  Pre-Procedure: - Last PSA Level: No results found for: PSA - Gentamicin given prophylactically - Levaquin 500 mg administered PO -Transrectal Ultrasound performed revealing a 45.96 gm prostate -No significant hypoechoic or median lobe noted  Procedure: - Prostate block performed using 10 cc 1% lidocaine and biopsies taken from sextant areas, a total of 12 under ultrasound guidance.  Post-Procedure: - Patient tolerated the procedure well - He was counseled to seek immediate medical attention if experiences any severe pain, significant bleeding, or fevers - Return in one week to discuss biopsy results

## 2016-09-01 NOTE — Addendum Note (Signed)
Addended by: Wilson Singer on: 09/01/2016 01:51 PM   Modules accepted: Orders

## 2016-09-08 ENCOUNTER — Encounter: Payer: Self-pay | Admitting: Urology

## 2016-09-08 ENCOUNTER — Other Ambulatory Visit: Payer: Self-pay | Admitting: Urology

## 2016-09-08 ENCOUNTER — Ambulatory Visit (INDEPENDENT_AMBULATORY_CARE_PROVIDER_SITE_OTHER): Payer: Medicare HMO | Admitting: Urology

## 2016-09-08 VITALS — BP 163/83 | HR 54 | Ht 69.0 in | Wt 196.0 lb

## 2016-09-08 DIAGNOSIS — C61 Malignant neoplasm of prostate: Secondary | ICD-10-CM

## 2016-09-08 LAB — PATHOLOGY REPORT

## 2016-09-08 NOTE — Progress Notes (Signed)
09/08/2016 12:20 PM   Charles Holder April 11, 1966 [id number]  Referring provider: Abner Greenspan, MD [address] [place], [place] [address]  Chief Complaint  Patient presents with  . Follow-up    Biopsy results    HPI: Pt returns to review biopsy results. Nov 2017 Low Risk Prostate Cancer T1c, PSA 5.4, prostate 46 grams Gleason 3+3=6, 4 cores, all left mid and left apex, 57-83 %; +PNI in left mid medial core   IPSS 9, mixed  - nocturia, frequency   IIEF ??? He has ED and carries NG.   He has coronary stents and is on Plavix.   He has been well since the biopsy.    PMH: Past Medical History:  Diagnosis Date  . Adopted   . Anxiety   . Atrial fibrillation ([place])   . BPH (benign prostatic hyperplasia)   . Cardiomyopathy, ischemic 01/22/2011   EF 50%  . Chronic lower back pain    s/p L5-S1  . Coronary artery disease   . Depression   . Diabetes mellitus   . ED (erectile dysfunction)   . Fatigue   . Headache   . Heart attack   . Herpes genitalia   . Hyperlipidemia   . Hypertension   . Hypogonadism in male   . Neuropathy ([place])   . Obesity   . SOB (shortness of breath)     Surgical History: Past Surgical History:  Procedure Laterality Date  . back surgeries     x 4  . CORONARY ANGIOPLASTY  01/22/2011   stent    Home Medications:    Medication List       Accurate as of 09/08/16 12:20 PM. Always use your most recent med list.          amLODipine 10 MG tablet Commonly known as:  NORVASC Take 1 tablet (10 mg total) by mouth daily.   aspirin 81 MG EC tablet Take 81 mg by mouth daily.   clopidogrel 75 MG tablet Commonly known as:  PLAVIX Take 1 tablet (75 mg total) by mouth daily.   nitroGLYCERIN 0.4 MG SL tablet Commonly known as:  NITROSTAT Place 1 tablet (0.4 mg total) under the tongue every 5 (five) minutes as needed.   rosuvastatin 5 MG tablet Commonly known as:  CRESTOR Take 1 tablet (5 mg total) by mouth daily.   sertraline  50 MG tablet Commonly known as:  ZOLOFT Take 1 tablet (50 mg total) by mouth daily.   valACYclovir 1000 MG tablet Commonly known as:  VALTREX Take 1 tablet (1,000 mg total) by mouth daily.       Allergies:  Allergies  Allergen Reactions  . Codeine     Makes me sick     Family History: Family History  Problem Relation Age of Onset  . Adopted: Yes    Social History:  reports that he has been smoking Cigarettes.  He has been smoking about 0.75 packs per day. He has never used smokeless tobacco. He reports that he does not drink alcohol or use drugs.  ROS: UROLOGY Frequent Urination?: No Hard to postpone urination?: No Burning/pain with urination?: No Get up at night to urinate?: No Leakage of urine?: No Urine stream starts and stops?: No Trouble starting stream?: No Do you have to strain to urinate?: No Blood in urine?: No Urinary tract infection?: No Sexually transmitted disease?: Yes Injury to kidneys or bladder?: No Painful intercourse?: No Weak stream?: No Erection problems?: Yes Penile pain?:  No  Gastrointestinal Nausea?: No Vomiting?: No Indigestion/heartburn?: No Diarrhea?: No Constipation?: No  Constitutional Fever: No Night sweats?: No Weight loss?: Yes Fatigue?: Yes  Skin Skin rash/lesions?: No Itching?: No  Eyes Blurred vision?: No Double vision?: No  Ears/Nose/Throat Sore throat?: No Sinus problems?: No  Hematologic/Lymphatic Swollen glands?: No Easy bruising?: No  Cardiovascular Leg swelling?: No Chest pain?: Yes  Respiratory Cough?: No Shortness of breath?: No  Endocrine Excessive thirst?: No  Musculoskeletal Back pain?: Yes Joint pain?: Yes  Neurological Headaches?: No Dizziness?: No  Psychologic Depression?: Yes Anxiety?: Yes  Physical Exam: BP (!) 163/83   Pulse (!) 54   Ht 5\' 9"  (1.753 m)   Wt 88.9 kg (196 lb)   BMI 28.94 kg/m   Constitutional:  Alert and oriented, No acute distress. Respiratory:  Normal respiratory effort, no increased work of breathing. Skin: No rashes, bruises or suspicious lesions. Neurologic: Grossly intact, no focal deficits, moving all 4 extremities. Psychiatric: Normal mood and affect.  Laboratory Data: Lab Results  Component Value Date   WBC 15.1 (H) 08/18/2016   HGB 16.5 08/18/2016   HCT 47.8 08/18/2016   MCV 89.9 08/18/2016   PLT 343.0 08/18/2016    Lab Results  Component Value Date   CREATININE 1.21 08/18/2016    No results found for: PSA  No results found for: TESTOSTERONE  Lab Results  Component Value Date   HGBA1C  01/23/2011    5.5 (NOTE)                                                                       According to the ADA Clinical Practice Recommendations for 2011, when HbA1c is used as a screening test:   >=6.5%   Diagnostic of Diabetes Mellitus           (if abnormal result  is confirmed)  5.7-6.4%   Increased risk of developing Diabetes Mellitus  References:Diagnosis and Classification of Diabetes Mellitus,Diabetes [id number] 1):S62-S69 and Standards of Medical Care in         Diabetes - 2011,Diabetes Care,2011,34  (Suppl 1):S11-S61.    Urinalysis No results found for: COLORURINE, APPEARANCEUR, LABSPEC, PHURINE, GLUCOSEU, HGBUR, BILIRUBINUR, KETONESUR, PROTEINUR, UROBILINOGEN, NITRITE, LEUKOCYTESUR  Pertinent Imaging:  Assessment & Plan:  Low Risk PCa but with high % of cores and +PNI in one bx.  I had a long discussion with the patient and his wife using the Living with Prostate Cancer booklet. We went over his stage, grade and prognosis and the relevant anatomy. We discussed the nature risks and benefits of active surveillance, radical prostatectomy, IMRT (+/- brachytherapy, +/- ADT). We discussed specifically how each treatment might affect the bowel, bladder and sexual function. We discussed how each treatment might effect salvage treatments. We discussed the role of other modalities in the treatment of prostate  cancer including chemotherapy, HIFU and cryotherapy. We discussed focal therapy. All questions answered. He'll consider. I'll refer to Dr. Baruch Gouty to discuss radiation techniques and with Dr. Erlene Quan to discuss further and consider RALP.   There are no diagnoses linked to this encounter.  No Follow-up on file.  Festus Aloe, [facility] [address] [place], [place] [address] [phone]

## 2016-09-10 ENCOUNTER — Ambulatory Visit: Payer: Medicare HMO

## 2016-09-10 ENCOUNTER — Encounter: Payer: Self-pay | Admitting: Family Medicine

## 2016-09-10 DIAGNOSIS — R310 Gross hematuria: Secondary | ICD-10-CM

## 2016-09-10 LAB — URINALYSIS, COMPLETE
Bilirubin, UA: NEGATIVE
Glucose, UA: NEGATIVE
Ketones, UA: NEGATIVE
Leukocytes, UA: NEGATIVE
NITRITE UA: NEGATIVE
PH UA: 6 (ref 5.0–7.5)
Specific Gravity, UA: 1.02 (ref 1.005–1.030)
UUROB: 0.2 mg/dL (ref 0.2–1.0)

## 2016-09-10 LAB — MICROSCOPIC EXAMINATION: BACTERIA UA: NONE SEEN

## 2016-09-10 NOTE — Progress Notes (Signed)
Pt presented today with c/o gross hematuria 2 weeks post prostate bx.  Pt stated gross hematuria stated 2 days ago. Pt described urine to be pink at first and now bright red. Reinforced with pt to drink plenty of water to help flush the system as bleeding post prostate bx is a side effect. A clean catch was obtained for u/a. Pt voiced understanding of whole conversation.

## 2016-09-12 ENCOUNTER — Encounter: Payer: Self-pay | Admitting: Family Medicine

## 2016-09-12 DIAGNOSIS — F418 Other specified anxiety disorders: Secondary | ICD-10-CM

## 2016-09-17 ENCOUNTER — Institutional Professional Consult (permissible substitution): Payer: Medicare HMO | Admitting: Radiation Oncology

## 2016-09-18 ENCOUNTER — Ambulatory Visit
Admission: RE | Admit: 2016-09-18 | Discharge: 2016-09-18 | Disposition: A | Discharge status: Home / Self Care | Payer: Medicare HMO | Source: Ambulatory Visit | Attending: Radiation Oncology | Admitting: Radiation Oncology

## 2016-09-18 ENCOUNTER — Encounter: Payer: Self-pay | Admitting: Radiation Oncology

## 2016-09-18 VITALS — BP 142/94 | HR 72 | Temp 97.4°F | Ht 69.0 in | Wt 198.0 lb

## 2016-09-18 DIAGNOSIS — N529 Male erectile dysfunction, unspecified: Secondary | ICD-10-CM | POA: Diagnosis not present

## 2016-09-18 DIAGNOSIS — I429 Cardiomyopathy, unspecified: Secondary | ICD-10-CM | POA: Diagnosis not present

## 2016-09-18 DIAGNOSIS — E785 Hyperlipidemia, unspecified: Secondary | ICD-10-CM | POA: Diagnosis not present

## 2016-09-18 DIAGNOSIS — E291 Testicular hypofunction: Secondary | ICD-10-CM | POA: Diagnosis not present

## 2016-09-18 DIAGNOSIS — R5383 Other fatigue: Secondary | ICD-10-CM | POA: Insufficient documentation

## 2016-09-18 DIAGNOSIS — E119 Type 2 diabetes mellitus without complications: Secondary | ICD-10-CM | POA: Diagnosis not present

## 2016-09-18 DIAGNOSIS — I10 Essential (primary) hypertension: Secondary | ICD-10-CM | POA: Diagnosis not present

## 2016-09-18 DIAGNOSIS — N400 Enlarged prostate without lower urinary tract symptoms: Secondary | ICD-10-CM | POA: Insufficient documentation

## 2016-09-18 DIAGNOSIS — M545 Low back pain: Secondary | ICD-10-CM | POA: Diagnosis not present

## 2016-09-18 DIAGNOSIS — C61 Malignant neoplasm of prostate: Secondary | ICD-10-CM | POA: Insufficient documentation

## 2016-09-18 DIAGNOSIS — F329 Major depressive disorder, single episode, unspecified: Secondary | ICD-10-CM | POA: Diagnosis not present

## 2016-09-18 DIAGNOSIS — I2510 Atherosclerotic heart disease of native coronary artery without angina pectoris: Secondary | ICD-10-CM | POA: Insufficient documentation

## 2016-09-18 DIAGNOSIS — F17210 Nicotine dependence, cigarettes, uncomplicated: Secondary | ICD-10-CM | POA: Insufficient documentation

## 2016-09-18 DIAGNOSIS — Z7982 Long term (current) use of aspirin: Secondary | ICD-10-CM | POA: Diagnosis not present

## 2016-09-18 DIAGNOSIS — Z79899 Other long term (current) drug therapy: Secondary | ICD-10-CM | POA: Insufficient documentation

## 2016-09-18 DIAGNOSIS — A6000 Herpesviral infection of urogenital system, unspecified: Secondary | ICD-10-CM | POA: Diagnosis not present

## 2016-09-18 DIAGNOSIS — G8929 Other chronic pain: Secondary | ICD-10-CM | POA: Insufficient documentation

## 2016-09-18 DIAGNOSIS — I4891 Unspecified atrial fibrillation: Secondary | ICD-10-CM | POA: Insufficient documentation

## 2016-09-18 DIAGNOSIS — I252 Old myocardial infarction: Secondary | ICD-10-CM | POA: Insufficient documentation

## 2016-09-18 NOTE — Consult Note (Signed)
NEW PATIENT EVALUATION  Name: Charles Holder  MRN: [id number]  Date:   09/18/2016     DOB: Sep 22, 1966   This 50 y.o. male patient presents to the clinic for initial evaluation of stage I (T1 CN 0 M0) Gleason 6 (3+3) adenocarcinoma the prostate presenting with a PSA of 5.4.  REFERRING PHYSICIAN: Tower, Wynelle Fanny, MD  CHIEF COMPLAINT:  Chief Complaint  Patient presents with  . Prostate Cancer    initial evaluation    DIAGNOSIS: The encounter diagnosis was Malignant neoplasm of prostate ([place]).   PREVIOUS INVESTIGATIONS:  Pathology report reviewed [facility] nomogram reviewed Clinical notes reviewed  HPI: Patient is a 50 year old male who is had some increased lower urinary tract symptoms including frequency urgency and nocturia over the past year. His PSA was 5.4 which prompted a transrectal ultrasound-guided biopsy showing 4 out of 12 cores positive in the left mid and left apex region Gleason 6 adenocarcinoma 3+3). His prostate measures 46 g. Patient does have some erectile dysfunction has multiple comorbidities including atrial fibrillation ischemic cardiomyopathy coronary artery disease. He's been referred to radiation oncology for consideration of treatment options.  PLANNED TREATMENT REGIMEN: Surgery versus radiation therapy  PAST MEDICAL HISTORY:  has a past medical history of Adopted; Anxiety; Atrial fibrillation ([place]); BPH (benign prostatic hyperplasia); Cardiomyopathy, ischemic (01/22/2011); Chronic lower back pain; Coronary artery disease; Depression; Diabetes mellitus; ED (erectile dysfunction); Fatigue; Headache; Heart attack; Herpes genitalia; Hyperlipidemia; Hypertension; Hypogonadism in male; Neuropathy ([place]); Obesity; and SOB (shortness of breath).    PAST SURGICAL HISTORY:  Past Surgical History:  Procedure Laterality Date  . back surgeries     x 4  . CORONARY ANGIOPLASTY  01/22/2011   stent    FAMILY HISTORY: family history is not on file. He was  adopted.  SOCIAL HISTORY:  reports that he has been smoking Cigarettes.  He has been smoking about 0.75 packs per day. He has never used smokeless tobacco. He reports that he does not drink alcohol or use drugs.  ALLERGIES: Codeine  MEDICATIONS:  Current Outpatient Prescriptions  Medication Sig Dispense Refill  . amLODipine (NORVASC) 10 MG tablet Take 1 tablet (10 mg total) by mouth daily. 90 tablet 3  . aspirin 81 MG EC tablet Take 81 mg by mouth daily.      . clopidogrel (PLAVIX) 75 MG tablet Take 1 tablet (75 mg total) by mouth daily. 90 tablet 3  . nitroGLYCERIN (NITROSTAT) 0.4 MG SL tablet Place 1 tablet (0.4 mg total) under the tongue every 5 (five) minutes as needed. (Patient not taking: Reported on 09/08/2016) 25 tablet 3  . rosuvastatin (CRESTOR) 5 MG tablet Take 1 tablet (5 mg total) by mouth daily. 90 tablet 3  . sertraline (ZOLOFT) 50 MG tablet Take 1 tablet (50 mg total) by mouth daily. 30 tablet 5  . valACYclovir (VALTREX) 1000 MG tablet Take 1 tablet (1,000 mg total) by mouth daily. 90 tablet 4   No current facility-administered medications for this encounter.     ECOG PERFORMANCE STATUS:  1 - Symptomatic but completely ambulatory  REVIEW OF SYSTEMS: Except for the urinary symptoms  Patient denies any weight loss, fatigue, weakness, fever, chills or night sweats. Patient denies any loss of vision, blurred vision. Patient denies any ringing  of the ears or hearing loss. No irregular heartbeat. Patient denies heart murmur or history of fainting. Patient denies any chest pain or pain radiating to her upper extremities. Patient denies any shortness of breath, difficulty breathing at night, cough  or hemoptysis. Patient denies any swelling in the lower legs. Patient denies any nausea vomiting, vomiting of blood, or coffee ground material in the vomitus. Patient denies any stomach pain. Patient states has had normal bowel movements no significant constipation or diarrhea. Patient  denies any dysuria, hematuria or significant nocturia. Patient denies any problems walking, swelling in the joints or loss of balance. Patient denies any skin changes, loss of hair or loss of weight. Patient denies any excessive worrying or anxiety or significant depression. Patient denies any problems with insomnia. Patient denies excessive thirst, polyuria, polydipsia. Patient denies any swollen glands, patient denies easy bruising or easy bleeding. Patient denies any recent infections, allergies or URI. Patient "s visual fields have not changed significantly in recent time.    PHYSICAL EXAM: BP (!) 142/94   Pulse 72   Temp 97.4 F (36.3 C)   Ht 5\' 9"  (1.753 m)   Wt 197 lb 15.6 oz (89.8 kg)   BMI 29.24 kg/m  On rectal exam rectal sphincter tone is good prostate is somewhat firm no discrete nodularity is identified sulcus is preserved bilaterally. No other rectal abnormalities identified. Well-developed well-nourished patient in NAD. HEENT reveals PERLA, EOMI, discs not visualized.  Oral cavity is clear. No oral mucosal lesions are identified. Neck is clear without evidence of cervical or supraclavicular adenopathy. Lungs are clear to A&P. Cardiac examination is essentially unremarkable with regular rate and rhythm without murmur rub or thrill. Abdomen is benign with no organomegaly or masses noted. Motor sensory and DTR levels are equal and symmetric in the upper and lower extremities. Cranial nerves II through XII are grossly intact. Proprioception is intact. No peripheral adenopathy or edema is identified. No motor or sensory levels are noted. Crude visual fields are within normal range.  LABORATORY DATA: Pathology reports reviewed    RADIOLOGY RESULTS: No bone scan performed based on low PSA value   IMPRESSION: Stage I Gleason 6 adenocarcinoma the prostate in 50 year old male  PLAN: At the present time I got over treatment recommendations including both external beam and I-125  interstitial implant. I've arranged for him to meet with Dr. Erlene Quan to discuss robotic prostatectomy. He is young and I would advocate for treatment at this time. My option #1 would be for robotic prostatectomy since we can always salvage that should he have a positive margin or rising PSA in the future. If he chose radiation would offer I-125 interstitial implant and also that was discussed with the patient and his family. I've set up a follow-up appointment after his appointment with Dr. Erlene Quan. Patient family know to call with any concerns.  I would like to take this opportunity to thank you for allowing me to participate in the care of your patient.Armstead Peaks., MD

## 2016-09-19 ENCOUNTER — Encounter: Payer: Self-pay | Admitting: Family Medicine

## 2016-09-19 ENCOUNTER — Ambulatory Visit (INDEPENDENT_AMBULATORY_CARE_PROVIDER_SITE_OTHER): Payer: Medicare HMO | Admitting: Urology

## 2016-09-19 VITALS — BP 151/95 | HR 80 | Ht 69.0 in | Wt 196.0 lb

## 2016-09-19 DIAGNOSIS — N401 Enlarged prostate with lower urinary tract symptoms: Secondary | ICD-10-CM

## 2016-09-19 DIAGNOSIS — C61 Malignant neoplasm of prostate: Secondary | ICD-10-CM

## 2016-09-19 DIAGNOSIS — N138 Other obstructive and reflux uropathy: Secondary | ICD-10-CM

## 2016-09-19 DIAGNOSIS — R310 Gross hematuria: Secondary | ICD-10-CM | POA: Diagnosis not present

## 2016-09-19 MED ORDER — TAMSULOSIN HCL 0.4 MG PO CAPS: 0.4000 mg | ORAL_CAPSULE | Freq: Every day | ORAL | 11 refills | Status: DC

## 2016-09-20 NOTE — Progress Notes (Signed)
09/19/2016 11:45 AM   Charles Holder 28-Oct-1965 [id number]  Referring provider: Abner Greenspan, MD [address] [place], [place] [address]  Chief Complaint  Patient presents with  . Prostate Cancer    discuss surgery    HPI: 50 year old male with newly diagnosed low risk cT1c Gleason 3+3 prostate cancer, iPSA 5.4 who returns to the office today specifically discussed the option of robotic prostatectomy. He is previously met with Dr. Junious Silk to review his pathology and various treatment options. He is also had consultation with Dr. Lavena Stanford of radiation oncology to discuss external beam and brachial therapy. He continues to be quite anxious about his new diagnosis. He is accompanied by his parents today.  He is recently separated from his wife. He's been struggling with anxiety and depression especially since his diagnosis of prostate cancer. He does have a referral to psychiatry but has not yet been evaluated.  Dx 08/2016 Low Risk Prostate Cancer T1c, PSA 5.4, prostate 46 grams Gleason 3+3=6, 4 cores, all left mid and left apex, 57-83 %; +PNI in left mid medial core   IPSS 9, mixed  - nocturia, frequency    Mild baseline ED, carries NG    No previous abdominal surgeries. He's had a significant weight loss over the past year, volitional. BMI 28.  PMHx significant for CAD s/p PCI with stents on ASA/ plavix, cardiomyopathy, DM, HTN, HL, Afib   PMH: Past Medical History:  Diagnosis Date  . Adopted   . Anxiety   . Atrial fibrillation ([place])   . BPH (benign prostatic hyperplasia)   . Cardiomyopathy, ischemic 01/22/2011   EF 50%  . Chronic lower back pain    s/p L5-S1  . Coronary artery disease   . Depression   . Diabetes mellitus   . ED (erectile dysfunction)   . Fatigue   . Headache   . Heart attack   . Herpes genitalia   . Hyperlipidemia   . Hypertension   . Hypogonadism in male   . Neuropathy ([place])   . Obesity   . SOB (shortness of breath)      Surgical History: Past Surgical History:  Procedure Laterality Date  . back surgeries     x 4  . CORONARY ANGIOPLASTY  01/22/2011   stent    Home Medications:    Medication List       Accurate as of 09/19/16 11:59 PM. Always use your most recent med list.          amLODipine 10 MG tablet Commonly known as:  NORVASC Take 1 tablet (10 mg total) by mouth daily.   aspirin 81 MG EC tablet Take 81 mg by mouth daily.   clopidogrel 75 MG tablet Commonly known as:  PLAVIX Take 1 tablet (75 mg total) by mouth daily.   nitroGLYCERIN 0.4 MG SL tablet Commonly known as:  NITROSTAT Place 1 tablet (0.4 mg total) under the tongue every 5 (five) minutes as needed.   rosuvastatin 5 MG tablet Commonly known as:  CRESTOR Take 1 tablet (5 mg total) by mouth daily.   sertraline 50 MG tablet Commonly known as:  ZOLOFT Take 1 tablet (50 mg total) by mouth daily.   tamsulosin 0.4 MG Caps capsule Commonly known as:  FLOMAX Take 1 capsule (0.4 mg total) by mouth daily.   valACYclovir 1000 MG tablet Commonly known as:  VALTREX Take 1 tablet (1,000 mg total) by mouth daily.       Allergies:  Allergies  Allergen Reactions  . Codeine     Makes me sick     Family History: Family History  Problem Relation Age of Onset  . Adopted: Yes    Social History:  reports that he has been smoking Cigarettes.  He has been smoking about 0.75 packs per day. He has never used smokeless tobacco. He reports that he does not drink alcohol or use drugs.  ROS: UROLOGY Frequent Urination?: Yes Hard to postpone urination?: No Burning/pain with urination?: Yes Get up at night to urinate?: Yes Leakage of urine?: Yes Urine stream starts and stops?: Yes Trouble starting stream?: No Do you have to strain to urinate?: No Blood in urine?: Yes Urinary tract infection?: No Sexually transmitted disease?: No Injury to kidneys or bladder?: No Painful intercourse?: No Weak stream?: No Erection  problems?: Yes Penile pain?: No  Gastrointestinal Nausea?: No Vomiting?: No Indigestion/heartburn?: Yes Diarrhea?: No Constipation?: No  Constitutional Fever: No Night sweats?: No Weight loss?: No Fatigue?: Yes  Skin Skin rash/lesions?: No Itching?: No  Eyes Blurred vision?: No Double vision?: No  Ears/Nose/Throat Sore throat?: No Sinus problems?: No  Hematologic/Lymphatic Swollen glands?: No Easy bruising?: No  Cardiovascular Leg swelling?: No Chest pain?: Yes  Respiratory Cough?: No Shortness of breath?: No  Endocrine Excessive thirst?: Yes  Musculoskeletal Back pain?: Yes Joint pain?: Yes  Neurological Headaches?: No Dizziness?: No  Psychologic Depression?: Yes Anxiety?: Yes  Physical Exam: BP (!) 151/95   Pulse 80   Ht 5' 9"  (1.753 m)   Wt 196 lb (88.9 kg)   BMI 28.94 kg/m   Constitutional:  Alert and oriented, No acute distress.  Accompanied by partents. HEENT: [place] AT, moist mucus membranes.  Trachea midline, no masses. Cardiovascular: No clubbing, cyanosis, or edema. Respiratory: Normal respiratory effort, no increased work of breathing. Skin: No rashes, bruises or suspicious lesions. Neurologic: Grossly intact, no focal deficits, moving all 4 extremities. Psychiatric: Somewhat anxious.    Laboratory Data: Lab Results  Component Value Date   WBC 15.1 (H) 08/18/2016   HGB 16.5 08/18/2016   HCT 47.8 08/18/2016   MCV 89.9 08/18/2016   PLT 343.0 08/18/2016    Lab Results  Component Value Date   CREATININE 1.21 08/18/2016    Component     Latest Ref Rng & Units 03/12/2016 04/11/2016 08/07/2016 08/07/2016           2:04 PM  2:04 PM  PSA     0.0 - 4.0 ng/mL 5.2 (H) 3.4 5.4 (H) 5.4 (H)    Lab Results  Component Value Date   HGBA1C  01/23/2011    5.5 (NOTE)                                                                       According to the ADA Clinical Practice Recommendations for 2011, when HbA1c is used as a screening  test:   >=6.5%   Diagnostic of Diabetes Mellitus           (if abnormal result  is confirmed)  5.7-6.4%   Increased risk of developing Diabetes Mellitus  References:Diagnosis and Classification of Diabetes Mellitus,Diabetes [id number],8756,43(PIRJJ 1):S62-S69 and Standards of Medical Care in         Diabetes - 2011,Diabetes  [id number],9937,16  (Suppl 1):S11-S61.    Assessment & Plan:    1. Cancer of prostate with low recurrence risk (stage T1-2a and Gleason < 7 and PSA < 10) ([place]) The patient was counseled about the natural history of prostate cancer and the standard treatment options that are available for prostate cancer. It was explained to him how his age and life expectancy, clinical stage, Gleason score, and PSA affect his prognosis, the decision to proceed with additional staging studies, as well as how that information influences recommended treatment strategies. We discussed the roles for active surveillance, radiation therapy, surgical therapy, androgen deprivation, as well as ablative therapy options for the treatment of prostate cancer as appropriate to his individual cancer situation. We discussed the risks and benefits of these options with regard to their impact on cancer control and also in terms of potential adverse events, complications, and impact on quality of life particularly related to urinary, bowel, and sexual function. The patient was encouraged to ask questions throughout the discussion today and all questions were answered to his stated satisfaction. In addition, the patient was provided with and/or directed to appropriate resources and literature for further education about prostate cancer treatment options.  We discussed surgical therapy for prostate cancer including the different available surgical approaches. We discussed, in detail, the risks and expectations of surgery with regard to cancer control, urinary control, and erectile dysfunction as well as expected post operative cover he  processed. Additional risks of surgery including but not limalited to bleeding, infection, hernia formation, nerve damage, steel formation, bowel/rect injury, potentially necessitating colostomy, damage to the urinary tract resulting in urinary leakage, urethral stricture, and cardiopulmonary risk such as myocardial infarction, stroke, death, thromboembolism etc. were explained. The risk of open surgical conversion for robotics/laparoscopic prostatectomy is also discussed.  Given his multiple medical comorbidities, issues with uncontrolled anxiety and depression, and low risk cancer, I strongly recommended consideration of active surveillance at this time. He understands that at his age, he will likely need intervention at some point, however, we can prolong quality of his life and ideally maintain his erections incontinence for a period of time while closely surveilling him.  Given his age, I would follow him quite closely with every 3 month PSAs/DRE with consideration of biopsy/endorectal MRI in 6 months.  He is opted to proceed with surveillance at this time. He'll come back in 3 months for PSA/DRE and likely endorectal MRI versus biopsy at the 6 month period from diagnosis.  Importance of follow-up was discussed in detail.   2. BPH with obstruction/lower urinary tract symptoms Trial of flomax 0.4 mg for LUTS symtoms  3. Gross hematuria S/p biopsy, hematuria resolving Likely prolonged due to ASA/ plavix   Return in about 3 months (around 12/18/2016) for PSA/ DRE (PSA 1 weeks prior to visit).  Hollice Espy, MD  [facility] [address] [place], [place] [address] [phone]  I spent 25 min with this patient of which greater than 50% was spent in counseling and coordination of care with the patient.

## 2016-09-22 ENCOUNTER — Other Ambulatory Visit: Payer: Self-pay

## 2016-09-22 ENCOUNTER — Encounter (HOSPITAL_COMMUNITY): Payer: Self-pay

## 2016-09-22 DIAGNOSIS — C61 Malignant neoplasm of prostate: Secondary | ICD-10-CM

## 2016-09-22 MED ORDER — TAMSULOSIN HCL 0.4 MG PO CAPS: 0.4000 mg | ORAL_CAPSULE | Freq: Every day | ORAL | 11 refills | Status: DC

## 2016-09-25 ENCOUNTER — Ambulatory Visit (INDEPENDENT_AMBULATORY_CARE_PROVIDER_SITE_OTHER): Payer: Medicare HMO | Admitting: Psychiatry

## 2016-09-25 ENCOUNTER — Encounter (HOSPITAL_COMMUNITY): Payer: Self-pay | Admitting: Psychiatry

## 2016-09-25 VITALS — BP 140/90 | HR 50 | Ht 69.0 in | Wt 197.0 lb

## 2016-09-25 DIAGNOSIS — F1210 Cannabis abuse, uncomplicated: Secondary | ICD-10-CM | POA: Diagnosis not present

## 2016-09-25 DIAGNOSIS — Z79899 Other long term (current) drug therapy: Secondary | ICD-10-CM

## 2016-09-25 DIAGNOSIS — F331 Major depressive disorder, recurrent, moderate: Secondary | ICD-10-CM

## 2016-09-25 MED ORDER — LAMOTRIGINE 25 MG PO TABS: ORAL_TABLET | ORAL | 0 refills | Status: DC
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
## 2016-09-26 ENCOUNTER — Ambulatory Visit: Payer: Medicare HMO | Admitting: Family Medicine

## 2016-09-26 ENCOUNTER — Encounter: Payer: Self-pay | Admitting: Family Medicine

## 2016-10-20 ENCOUNTER — Other Ambulatory Visit: Payer: Medicare HMO

## 2016-10-21 ENCOUNTER — Ambulatory Visit (HOSPITAL_COMMUNITY): Payer: Self-pay | Admitting: Clinical

## 2016-10-27 ENCOUNTER — Telehealth (HOSPITAL_COMMUNITY): Payer: Self-pay

## 2016-10-27 ENCOUNTER — Ambulatory Visit: Payer: Medicare HMO | Admitting: Urology

## 2016-10-27 MED ORDER — ARIPIPRAZOLE 5 MG PO TABS: 5.0000 mg | ORAL_TABLET | Freq: Every day | ORAL | 0 refills | Status: DC

## 2016-10-27 NOTE — Telephone Encounter (Signed)
Patient called back, he said that he can not take the Abilify because he has heart disease, high bloodd pressure and high cholesterol, he would like to try something different.

## 2016-10-27 NOTE — Telephone Encounter (Signed)
I returned patient's phone call.  He is feeling more irritable since started the Lamictal.  I recommended to stop the Lamictal and try Abilify 5 mg.  Discussed medication side effects.  Recommended to call us back if symptoms do not improve.

## 2016-10-27 NOTE — Telephone Encounter (Signed)
Patient is calling, he said that the Lamictal is amplifying all of his symptoms, he said he is more miserable now than ever. Patient did contract with me that while he is having some passive SI, he does not intend to hurt himself. Please review and advise, thank you

## 2016-10-28 NOTE — Telephone Encounter (Signed)
He can try Wellbutrin XL 150 mg in the morning.

## 2016-10-29 ENCOUNTER — Ambulatory Visit (HOSPITAL_COMMUNITY): Payer: Self-pay | Admitting: Psychiatry

## 2016-10-31 ENCOUNTER — Encounter: Payer: Self-pay | Admitting: Family Medicine

## 2016-10-31 NOTE — Telephone Encounter (Signed)
I have tried to call patient back several times, he has no voicemail set up. I will wait for him to call me back.

## 2016-11-04 ENCOUNTER — Encounter: Payer: Self-pay | Admitting: Family Medicine

## 2016-11-27 ENCOUNTER — Other Ambulatory Visit: Payer: Self-pay | Admitting: *Deleted

## 2016-11-27 DIAGNOSIS — M79602 Pain in left arm: Secondary | ICD-10-CM

## 2016-11-27 MED ORDER — CLOPIDOGREL BISULFATE 75 MG PO TABS
75.0000 mg | ORAL_TABLET | Freq: Every day | ORAL | 3 refills | Status: DC
Start: 2016-11-27 — End: 2017-10-29

## 2016-11-27 MED ORDER — NITROGLYCERIN 0.4 MG SL SUBL: 0.4000 mg | SUBLINGUAL_TABLET | SUBLINGUAL | 1 refills | Status: DC | PRN

## 2016-11-27 MED ORDER — AMLODIPINE BESYLATE 10 MG PO TABS: 10.0000 mg | ORAL_TABLET | Freq: Every day | ORAL | 3 refills | Status: DC

## 2016-12-18 ENCOUNTER — Other Ambulatory Visit: Payer: Medicare HMO

## 2016-12-18 ENCOUNTER — Other Ambulatory Visit: Payer: Self-pay

## 2016-12-18 DIAGNOSIS — C61 Malignant neoplasm of prostate: Secondary | ICD-10-CM

## 2016-12-19 ENCOUNTER — Ambulatory Visit: Payer: Medicare PPO | Admitting: Urology

## 2016-12-19 VITALS — Ht 70.0 in | Wt 197.7 lb

## 2016-12-19 DIAGNOSIS — N401 Enlarged prostate with lower urinary tract symptoms: Secondary | ICD-10-CM | POA: Diagnosis not present

## 2016-12-19 DIAGNOSIS — C61 Malignant neoplasm of prostate: Secondary | ICD-10-CM | POA: Diagnosis not present

## 2016-12-19 DIAGNOSIS — N138 Other obstructive and reflux uropathy: Secondary | ICD-10-CM

## 2016-12-19 LAB — PSA: PROSTATE SPECIFIC AG, SERUM: 3.5 ng/mL (ref 0.0–4.0)

## 2016-12-19 MED ORDER — TAMSULOSIN HCL 0.4 MG PO CAPS: 0.4000 mg | ORAL_CAPSULE | Freq: Every day | ORAL | 11 refills | Status: DC

## 2016-12-19 NOTE — Progress Notes (Signed)
12/19/2016 1:17 PM   Charles Holder 07/22/1966 [id number]  Referring provider: Abner Greenspan, MD [address] [place], [place] [address]  Chief Complaint  Patient presents with  . Follow-up    prostate cancer     HPI: 51 year old male with newly diagnosed low risk cT1c Gleason 3+3 prostate cancer, iPSA 5.4 dx 11/17  who returns to the office today for PSA/ DRE per surveillance protocol.  Dx 08/2016 Low Risk Prostate Cancer T1c, PSA 5.4, prostate 46 grams Gleason 3+3=6, 4 cores, all left mid and left apex, 57-83 %; +PNI in left mid medial core   IPSS 9, mixed  - nocturia, frequency   --> improved with Flomax but has not been taking regularly (previous ectopic up 3-4 times a night now only once)  Mild baseline ED, carries NG     No previous abdominal surgeries. He's had a significant weight loss over the past year, volitional. BMI 28.  PMHx significant for CAD s/p PCI with stents on ASA/ plavix, cardiomyopathy, DM, HTN, HL, Afib   PMH: Past Medical History:  Diagnosis Date  . Adopted   . Anxiety   . Atrial fibrillation ([place])   . BPH (benign prostatic hyperplasia)   . Cardiomyopathy, ischemic 01/22/2011   EF 50%  . Chronic lower back pain    s/p L5-S1  . Coronary artery disease   . Depression   . ED (erectile dysfunction)   . Fatigue   . Headache   . Heart attack   . Herpes genitalia   . Hyperlipidemia   . Hypertension   . Hypogonadism in male   . Neuropathy ([place])   . Obesity   . SOB (shortness of breath)     Surgical History: Past Surgical History:  Procedure Laterality Date  . back surgeries     x 4  . CORONARY ANGIOPLASTY  01/22/2011   stent    Home Medications:  Allergies as of 12/19/2016      Reactions   Codeine    Makes me sick       Medication List       Accurate as of 12/19/16  1:17 PM. Always use your most recent med list.          amLODipine 10 MG tablet Commonly known as:  NORVASC Take 1 tablet (10 mg total) by mouth  daily.   ARIPiprazole 5 MG tablet Commonly known as:  ABILIFY Take 1 tablet (5 mg total) by mouth daily.   aspirin 81 MG EC tablet Take 81 mg by mouth daily.   clopidogrel 75 MG tablet Commonly known as:  PLAVIX Take 1 tablet (75 mg total) by mouth daily.   nitroGLYCERIN 0.4 MG SL tablet Commonly known as:  NITROSTAT Place 1 tablet (0.4 mg total) under the tongue every 5 (five) minutes as needed.   rosuvastatin 5 MG tablet Commonly known as:  CRESTOR Take 1 tablet (5 mg total) by mouth daily.   tamsulosin 0.4 MG Caps capsule Commonly known as:  FLOMAX Take 1 capsule (0.4 mg total) by mouth daily.   valACYclovir 1000 MG tablet Commonly known as:  VALTREX Take 1 tablet (1,000 mg total) by mouth daily.       Allergies:  Allergies  Allergen Reactions  . Codeine     Makes me sick     Family History: Family History  Problem Relation Age of Onset  . Adopted: Yes    Social History:  reports that he has been smoking  Cigarettes.  He has been smoking about 0.75 packs per day. He has never used smokeless tobacco. He reports that he uses drugs, including Marijuana. He reports that he does not drink alcohol.  ROS: UROLOGY Frequent Urination?: No Hard to postpone urination?: No Burning/pain with urination?: No Get up at night to urinate?: Yes Leakage of urine?: Yes Urine stream starts and stops?: No Trouble starting stream?: No Do you have to strain to urinate?: No Blood in urine?: No Urinary tract infection?: No Sexually transmitted disease?: No Injury to kidneys or bladder?: No Painful intercourse?: No Weak stream?: No Erection problems?: No Penile pain?: No  Gastrointestinal Nausea?: No Vomiting?: No Indigestion/heartburn?: No Diarrhea?: No Constipation?: No  Constitutional Fever: No Night sweats?: Yes Weight loss?: Yes Fatigue?: Yes  Skin Skin rash/lesions?: No Itching?: No  Eyes Blurred vision?: No Double vision?: No  Ears/Nose/Throat Sore  throat?: No Sinus problems?: No  Hematologic/Lymphatic Swollen glands?: No Easy bruising?: No  Cardiovascular Leg swelling?: No Chest pain?: Yes  Respiratory Cough?: No Shortness of breath?: No  Endocrine Excessive thirst?: No  Musculoskeletal Back pain?: Yes Joint pain?: Yes  Neurological Headaches?: No Dizziness?: No  Psychologic Depression?: Yes Anxiety?: Yes  Physical Exam: Ht 5\' 10"  (1.778 m)   Wt 197 lb 11.2 oz (89.7 kg)   BMI 28.37 kg/m   Constitutional:  Alert and oriented, No acute distress.  Accompanied by mother. HEENT: [place] AT, moist mucus membranes.  Trachea midline, no masses. Cardiovascular: No clubbing, cyanosis, or edema. Respiratory: Normal respiratory effort, no increased work of breathing. Rectal: Normal sphincter tone.  40 cc prostate, rubbery, no nodules, nontender. Skin: No rashes, bruises or suspicious lesions. Neurologic: Grossly intact, no focal deficits, moving all 4 extremities. Psychiatric: Somewhat anxious.    Laboratory Data: Lab Results  Component Value Date   WBC 15.1 (H) 08/18/2016   HGB 16.5 08/18/2016   HCT 47.8 08/18/2016   MCV 89.9 08/18/2016   PLT 343.0 08/18/2016    Lab Results  Component Value Date   CREATININE 1.21 08/18/2016    Component     Latest Ref Rng & Units 03/12/2016 04/11/2016 08/07/2016 08/07/2016           2:04 PM  2:04 PM  PSA     0.0 - 4.0 ng/mL 5.2 (H) 3.4 5.4 (H) 5.4 (H)   Component     Latest Ref Rng & Units 12/18/2016          PSA     0.0 - 4.0 ng/mL 3.5    Lab Results  Component Value Date   HGBA1C  01/23/2011    5.5 (NOTE)                                                                       According to the ADA Clinical Practice Recommendations for 2011, when HbA1c is used as a screening test:   >=6.5%   Diagnostic of Diabetes Mellitus           (if abnormal result  is confirmed)  5.7-6.4%   Increased risk of developing Diabetes Mellitus  References:Diagnosis and Classification of  Diabetes Mellitus,Diabetes XNAT,5573,22(GURKY 1):S62-S69 and Standards of Medical Care in         Diabetes - 2011,Diabetes HCWC,3762,83  (  Suppl 1):S11-S61.    Assessment & Plan:    1. Cancer of prostate with low recurrence risk (stage T1-2a and Gleason < 7 and PSA < 10) (HCC) Recommend continuation of active surveillance PSA today back to baseline at 3.5, rectal exam unremarkable Recheck in 4 months Consider prostate MRI versus repeat biopsy by one year interval   2. BPH with obstruction/lower urinary tract symptoms Improved with Flomax, encouraged to resume this medication   Return in about 4 months (around 04/20/2017) for PSA/ DRE.  Hollice Espy, MD  [facility] [address] [place], [place] [address] [phone]
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
## 2017-02-23 ENCOUNTER — Ambulatory Visit (INDEPENDENT_AMBULATORY_CARE_PROVIDER_SITE_OTHER): Payer: Medicare PPO | Admitting: Cardiovascular Disease

## 2017-02-23 ENCOUNTER — Encounter: Payer: Self-pay | Admitting: Cardiovascular Disease

## 2017-02-23 VITALS — BP 136/80 | HR 67 | Ht 70.0 in | Wt 195.5 lb

## 2017-02-23 DIAGNOSIS — I25118 Atherosclerotic heart disease of native coronary artery with other forms of angina pectoris: Secondary | ICD-10-CM | POA: Diagnosis not present

## 2017-02-23 DIAGNOSIS — I10 Essential (primary) hypertension: Secondary | ICD-10-CM

## 2017-02-23 DIAGNOSIS — E782 Mixed hyperlipidemia: Secondary | ICD-10-CM

## 2017-02-23 DIAGNOSIS — F17200 Nicotine dependence, unspecified, uncomplicated: Secondary | ICD-10-CM | POA: Diagnosis not present

## 2017-02-23 DIAGNOSIS — R079 Chest pain, unspecified: Secondary | ICD-10-CM | POA: Diagnosis not present

## 2017-02-23 NOTE — Patient Instructions (Addendum)
Medication Instructions:   No medication changes made  Call me if you would like to try ZETIA, for cholesterol, 10% drop  Labwork:  No new labs needed  Testing/Procedures:  No further testing at this time   I recommend watching educational videos on topics of interest to you at:       [email or web address]  Enter code: HEARTCARE    Follow-Up: It was a pleasure seeing you in the office today. Please call us if you have new issues that need to be addressed before your next appt.  [phone]  Your physician wants you to follow-up in: 12 months.  You will receive a reminder letter in the mail two months in advance. If you don't receive a letter, please call our office to schedule the follow-up appointment.  If you need a refill on your cardiac medications before your next appointment, please call your pharmacy.

## 2017-03-12 ENCOUNTER — Other Ambulatory Visit: Payer: Medicare HMO

## 2017-03-19 ENCOUNTER — Ambulatory Visit: Payer: Medicare HMO | Admitting: Urology

## 2017-04-14 ENCOUNTER — Encounter: Payer: Self-pay | Admitting: Urology

## 2017-04-20 ENCOUNTER — Encounter: Payer: Self-pay | Admitting: Urology

## 2017-04-23 ENCOUNTER — Encounter: Payer: Self-pay | Admitting: Urology

## 2017-04-24 ENCOUNTER — Ambulatory Visit (INDEPENDENT_AMBULATORY_CARE_PROVIDER_SITE_OTHER): Payer: Medicare PPO | Admitting: Urology

## 2017-04-24 ENCOUNTER — Telehealth: Payer: Self-pay | Admitting: Cardiovascular Disease

## 2017-04-24 ENCOUNTER — Encounter: Payer: Self-pay | Admitting: Urology

## 2017-04-24 VITALS — BP 151/85 | HR 66 | Ht 70.0 in | Wt 190.1 lb

## 2017-04-24 DIAGNOSIS — Z202 Contact with and (suspected) exposure to infections with a predominantly sexual mode of transmission: Secondary | ICD-10-CM | POA: Diagnosis not present

## 2017-04-24 DIAGNOSIS — C61 Malignant neoplasm of prostate: Secondary | ICD-10-CM

## 2017-04-24 NOTE — Telephone Encounter (Signed)
Patient has upcoming prostate biopsy scheduled 11/28 and wants to know if to stop plavix and if so when    Please send my chart msg for this response

## 2017-04-24 NOTE — Telephone Encounter (Signed)
Left voicemail message to call back  

## 2017-04-24 NOTE — Progress Notes (Signed)
04/24/2017 10:34 AM   Charles Holder [birth year] [id number]  Referring provider: Abner Greenspan, MD [address] [place], [place] [address]  Chief Complaint  Patient presents with  . Follow-up    4 month follow up Prostate cancer    HPI: 51 year old male with newly diagnosed low risk cT1c Gleason 3+3 prostate cancer, iPSA 5.4 dx 11/17  who returns to the office today for PSA/ DRE per surveillance protocol.  Dx 08/2016 Low Risk Prostate Cancer T1c, PSA 5.4, prostate 46 grams Gleason 3+3=6, 4 cores, all left mid and left apex, 57-83 %; +PNI in left mid medial core   IPSS 9, mixed  - nocturia, frequency   --> improved with Flomax   Mild baseline ED, carries NG     No previous abdominal surgeries. He's had a significant weight loss over the past year, volitional. BMI 27.  Today, he notes that he has had a new sexual partner and is worried about STD exposure. He does have a history of herpes and had a recent outbreak. He is currently on Valtrex. He would like to be tested for all other STDs. He denies any penile discharge today.  PMHx significant for CAD s/p PCI with stents on ASA/ plavix, cardiomyopathy, DM, HTN, HL, Afib   PMH: Past Medical History:  Diagnosis Date  . Adopted   . Anxiety   . Atrial fibrillation ([place])   . BPH (benign prostatic hyperplasia)   . Cardiomyopathy, ischemic 01/22/2011   EF 50%  . Chronic lower back pain    s/p L5-S1  . Coronary artery disease   . Depression   . ED (erectile dysfunction)   . Fatigue   . Headache   . Heart attack ([place])   . Herpes genitalia   . Hyperlipidemia   . Hypertension   . Hypogonadism in male   . Neuropathy   . Obesity   . SOB (shortness of breath)     Surgical History: Past Surgical History:  Procedure Laterality Date  . back surgeries     x 4  . CORONARY ANGIOPLASTY  01/22/2011   stent    Home Medications:  Allergies as of 04/24/2017      Reactions   Codeine    Makes me sick         Medication List       Accurate as of 04/24/17 10:34 AM. Always use your most recent med list.          amLODipine 10 MG tablet Commonly known as:  NORVASC Take 1 tablet (10 mg total) by mouth daily.   aspirin 81 MG EC tablet Take 81 mg by mouth daily.   clopidogrel 75 MG tablet Commonly known as:  PLAVIX Take 1 tablet (75 mg total) by mouth daily.   nitroGLYCERIN 0.4 MG SL tablet Commonly known as:  NITROSTAT Place 1 tablet (0.4 mg total) under the tongue every 5 (five) minutes as needed.   tamsulosin 0.4 MG Caps capsule Commonly known as:  FLOMAX Take 1 capsule (0.4 mg total) by mouth daily.   valACYclovir 1000 MG tablet Commonly known as:  VALTREX Take 1 tablet (1,000 mg total) by mouth daily.       Allergies:  Allergies  Allergen Reactions  . Codeine     Makes me sick     Family History: Family History  Problem Relation Age of Onset  . Adopted: Yes  . Prostate cancer Neg Hx     Social History:  reports that he has been smoking Cigarettes.  He has been smoking about 1.00 pack per day. He has never used smokeless tobacco. He reports that he uses drugs, including Marijuana. He reports that he does not drink alcohol.  ROS: UROLOGY Frequent Urination?: Yes Hard to postpone urination?: No Burning/pain with urination?: No Get up at night to urinate?: Yes Leakage of urine?: No Urine stream starts and stops?: No Trouble starting stream?: No Do you have to strain to urinate?: No Blood in urine?: No Urinary tract infection?: No Sexually transmitted disease?: Yes Injury to kidneys or bladder?: No Painful intercourse?: No Weak stream?: No Erection problems?: No Penile pain?: No  Gastrointestinal Nausea?: No Vomiting?: No Indigestion/heartburn?: Yes Diarrhea?: No Constipation?: No  Constitutional Fever: No Night sweats?: Yes Weight loss?: Yes Fatigue?: Yes  Skin Skin rash/lesions?: No Itching?: No  Eyes Blurred vision?: No Double vision?:  No  Ears/Nose/Throat Sore throat?: No Sinus problems?: No  Hematologic/Lymphatic Swollen glands?: Yes Easy bruising?: Yes  Cardiovascular Leg swelling?: No Chest pain?: Yes  Respiratory Cough?: No Shortness of breath?: No  Endocrine Excessive thirst?: Yes  Musculoskeletal Back pain?: Yes Joint pain?: Yes  Neurological Headaches?: No Dizziness?: No  Psychologic Depression?: Yes Anxiety?: Yes  Physical Exam: BP (!) 151/85   Pulse 66   Ht 5\' 10"  (1.778 m)   Wt 190 lb 1.6 oz (86.2 kg)   BMI 27.28 kg/m   Constitutional:  Alert and oriented, No acute distress.   HEENT: [place] AT, moist mucus membranes.  Trachea midline, no masses. Cardiovascular: No clubbing, cyanosis, or edema. Respiratory: Normal respiratory effort, no increased work of breathing. GU: Uncircumcised phallus without ulcers, urethral discharge. Normal bilateral descended testicles, nontender, no masses. No inguinal adenopathy. Rectal: Normal sphincter tone.  40 cc prostate, rubbery, no nodules, nontender. Skin: No rashes, bruises or suspicious lesions. Neurologic: Grossly intact, no focal deficits, moving all 4 extremities. Psychiatric: Somewhat anxious.    Laboratory Data: Lab Results  Component Value Date   WBC 15.1 (H) 08/18/2016   HGB 16.5 08/18/2016   HCT 47.8 08/18/2016   MCV 89.9 08/18/2016   PLT 343.0 08/18/2016    Lab Results  Component Value Date   CREATININE 1.21 08/18/2016    Component     Latest Ref Rng & Units 03/12/2016 04/11/2016 08/07/2016 08/07/2016           2:04 PM  2:04 PM  PSA     0.0 - 4.0 ng/mL 5.2 (H) 3.4 5.4 (H) 5.4 (H)   Component     Latest Ref Rng & Units 12/18/2016          PSA     0.0 - 4.0 ng/mL 3.5     Assessment & Plan:    1. Cancer of prostate with low recurrence risk (stage T1-2a and Gleason < 7 and PSA < 10) (HCC) Recommend continuation of active surveillance PSA today, will call with results, rectal exam unchanged I recommended proceeding  repeat biopsy at 1 year from diagnosis, 08/2017 which we discussed today, he will hold his aspirin and Plavix a week prior to the procedure as he is done previously, biopsy instructions were reviewed today.  2. BPH with obstruction/lower urinary tract symptoms Continue flomax  3. STI exposure Desires testing for HIV, syphilis, gonorrhea, chlamydia.   Return for prostate biopsy 08/2017.  Hollice Espy, MD  [facility] [address] [place], [place] [address] [phone]

## 2017-04-25 LAB — RPR: RPR Ser Ql: NONREACTIVE

## 2017-04-25 LAB — PSA: Prostate Specific Ag, Serum: 2.7 ng/mL (ref 0.0–4.0)

## 2017-04-25 LAB — HIV ANTIBODY (ROUTINE TESTING W REFLEX): HIV Screen 4th Generation wRfx: NONREACTIVE

## 2017-04-27 ENCOUNTER — Telehealth: Payer: Self-pay

## 2017-04-27 NOTE — Telephone Encounter (Signed)
-----   Message from Hollice Espy, MD sent at 04/26/2017  1:06 PM EDT ----- PSA is excellent. Syphilis and HIV are negative. Gonorrhea and Chlamydia are pending.  Hollice Espy, MD

## 2017-04-27 NOTE — Telephone Encounter (Signed)
Patient notified/SW 

## 2017-04-29 ENCOUNTER — Encounter: Payer: Self-pay | Admitting: *Deleted

## 2017-04-29 ENCOUNTER — Telehealth: Payer: Self-pay

## 2017-04-29 LAB — GC/CHLAMYDIA PROBE AMP
Chlamydia trachomatis, NAA: NEGATIVE
Neisseria gonorrhoeae by PCR: NEGATIVE

## 2017-04-29 NOTE — Telephone Encounter (Signed)
Left detailed voicemail message that surgeon will normally send [place] a cardiac clearance request for instructions regarding plavix and to make sure they know he is on that and if needed to contact our office with number to call back if any questions.

## 2017-04-29 NOTE — Telephone Encounter (Signed)
-----   Message from Hollice Espy, MD sent at 04/29/2017  9:06 AM EDT ----- Please let him know these labs are negative.

## 2017-04-29 NOTE — Telephone Encounter (Signed)
Patient notified of results.

## 2017-04-29 NOTE — Telephone Encounter (Signed)
[place] message sent to patient.

## 2017-05-21 ENCOUNTER — Encounter: Payer: Self-pay | Admitting: Urology

## 2017-06-04 ENCOUNTER — Other Ambulatory Visit: Payer: Self-pay | Admitting: Urology

## 2017-06-04 ENCOUNTER — Other Ambulatory Visit: Payer: Self-pay

## 2017-06-04 DIAGNOSIS — B009 Herpesviral infection, unspecified: Secondary | ICD-10-CM

## 2017-06-04 MED ORDER — VALACYCLOVIR HCL 1 G PO TABS: 1000.0000 mg | ORAL_TABLET | Freq: Every day | ORAL | 4 refills | Status: DC

## 2017-07-04 DIAGNOSIS — H00021 Hordeolum internum right upper eyelid: Secondary | ICD-10-CM | POA: Diagnosis not present

## 2017-07-27 DIAGNOSIS — H40033 Anatomical narrow angle, bilateral: Secondary | ICD-10-CM | POA: Diagnosis not present

## 2017-07-27 DIAGNOSIS — H04123 Dry eye syndrome of bilateral lacrimal glands: Secondary | ICD-10-CM | POA: Diagnosis not present

## 2017-09-09 ENCOUNTER — Ambulatory Visit (INDEPENDENT_AMBULATORY_CARE_PROVIDER_SITE_OTHER): Payer: Medicare PPO | Admitting: Urology

## 2017-09-09 ENCOUNTER — Encounter: Payer: Self-pay | Admitting: Urology

## 2017-09-09 VITALS — BP 129/79 | HR 71 | Wt 199.0 lb

## 2017-09-09 DIAGNOSIS — C61 Malignant neoplasm of prostate: Secondary | ICD-10-CM

## 2017-09-09 NOTE — Progress Notes (Signed)
Procedure rescheduled today as patient held ASA but NOT plavix.    Instructions reviewed again in detail.    Hollice Espy, MD

## 2017-09-23 ENCOUNTER — Ambulatory Visit: Payer: Medicare PPO | Admitting: Urology

## 2017-09-24 ENCOUNTER — Encounter: Payer: Self-pay | Admitting: Urology

## 2017-09-24 ENCOUNTER — Other Ambulatory Visit: Payer: Self-pay | Admitting: Urology

## 2017-09-24 ENCOUNTER — Ambulatory Visit (INDEPENDENT_AMBULATORY_CARE_PROVIDER_SITE_OTHER): Payer: Medicare PPO | Admitting: Urology

## 2017-09-24 VITALS — BP 115/70 | HR 76 | Ht 70.0 in | Wt 200.0 lb

## 2017-09-24 DIAGNOSIS — C61 Malignant neoplasm of prostate: Secondary | ICD-10-CM

## 2017-09-24 MED ORDER — LEVOFLOXACIN 500 MG PO TABS
500.0000 mg | ORAL_TABLET | Freq: Once | ORAL | Status: AC
  Administered 2017-09-24: 500 mg via ORAL

## 2017-09-24 MED ORDER — GENTAMICIN SULFATE 40 MG/ML IJ SOLN
80.0000 mg | Freq: Once | INTRAMUSCULAR | Status: AC
  Administered 2017-09-24: 80 mg via INTRAMUSCULAR

## 2017-09-24 NOTE — Progress Notes (Signed)
   09/24/17  CC:  Chief Complaint  Patient presents with  . Prostate Cancer  . Prostate Biopsy    HPI: 51 year old male with newly diagnosed low risk cT1c Gleason 3+3 prostate cancer, iPSA 5.4 dx 11/17  on active surveillance today for repeat confirmatory biopsy.  Dx 08/2016 Low Risk Prostate Cancer T1c, PSA 5.4, prostate 46 grams Gleason 3+3=6, 4 cores, all left mid and left apex, 57-83 %; +PNI in left mid medial core   Most recent PSA/2018 back down to 2.7.    Vitals:   09/24/17 0915  BP: 115/70  Pulse: 76   NED. A&Ox3.   No respiratory distress   Abd soft, NT, ND Normal sphincter tone  Prostate Biopsy Procedure   Informed consent was obtained after discussing risks/benefits of the procedure.  A time out was performed to ensure correct patient identity.  Pre-Procedure: - Gentamicin given prophylactically - Levaquin 500 mg administered PO -Transrectal Ultrasound performed revealing a 37.7 gm prostate -No significant hypoechoic or median lobe noted  Procedure: - Prostate block performed using 10 cc 1% lidocaine and biopsies taken from sextant areas, a total of 12 under ultrasound guidance.  Post-Procedure: - Patient tolerated the procedure well - He was counseled to seek immediate medical attention if experiences any severe pain, significant bleeding, or fevers - Return in one week to discuss biopsy results  Assessment/ Plan:  1. Prostate cancer ([place]) Repeat PSA today pending Status post uncomplicated biopsy today Warning symptoms reviewed in detail as above Follow-up to discuss pathology results-we will call sooner if pathology is unchanged - PSA - gentamicin (GARAMYCIN) injection 80 mg - levofloxacin (LEVAQUIN) tablet 500 mg   Hollice Espy, MD

## 2017-09-25 LAB — PSA: Prostate Specific Ag, Serum: 3.6 ng/mL (ref 0.0–4.0)

## 2017-09-29 ENCOUNTER — Encounter: Payer: Self-pay | Admitting: Cardiovascular Disease

## 2017-10-02 ENCOUNTER — Other Ambulatory Visit: Payer: Self-pay | Admitting: Urology

## 2017-10-02 ENCOUNTER — Encounter: Payer: Self-pay | Admitting: Urology

## 2017-10-20 ENCOUNTER — Ambulatory Visit (INDEPENDENT_AMBULATORY_CARE_PROVIDER_SITE_OTHER): Payer: Medicare PPO | Admitting: Urology

## 2017-10-20 ENCOUNTER — Encounter: Payer: Self-pay | Admitting: Urology

## 2017-10-20 VITALS — BP 121/74 | HR 77 | Ht 70.0 in | Wt 205.0 lb

## 2017-10-20 DIAGNOSIS — N138 Other obstructive and reflux uropathy: Secondary | ICD-10-CM

## 2017-10-20 DIAGNOSIS — N401 Enlarged prostate with lower urinary tract symptoms: Secondary | ICD-10-CM | POA: Diagnosis not present

## 2017-10-20 DIAGNOSIS — C61 Malignant neoplasm of prostate: Secondary | ICD-10-CM

## 2017-10-20 NOTE — Progress Notes (Signed)
10/20/2017 1:34 PM   Charles Holder 12/25/1965 [id number]  Referring provider: Abner Greenspan, MD [address] [place], [place] [address]  Chief Complaint  Patient presents with  . Prostate Cancer    HPI: 52 year old male with a history of low risk cT1c Gleason 3+3 prostate cancer, iPSA 5.4 dx 11/17 s/p recent confirmatory prostate biopsy who returns today to discuss results.  No issues after biopsy.  This biopsy was significantly better than his previous and much better tolerated.  No further gross hematuria or hematospermia.  Dx 08/2016 Low Risk Prostate Cancer T1c, PSA 5.4, prostate 46 grams Gleason 3+3=6, 4 cores, all left mid and left apex, 57-83 %; +PNI in left mid medial core   Repeat bx 09/24/17 T1c, PSA 3.6, TRUS vol 37.7 Gleason 3+3, 7/12 cores, primarily on left, 77-87% (negligible 1% in single isolated corn right), positive perineural invasion  IPSS 9, mixed  - nocturia, frequency   --> improved with Flomax   Mild baseline ED, carries NG    PMHx significant for CAD s/p PCI with stents on ASA/ plavix, cardiomyopathy, DM, HTN, HL, Afib   PMH: Past Medical History:  Diagnosis Date  . Adopted   . Anxiety   . Atrial fibrillation ([place])   . BPH (benign prostatic hyperplasia)   . Cardiomyopathy, ischemic 01/22/2011   EF 50%  . Chronic lower back pain    s/p L5-S1  . Coronary artery disease   . Depression   . ED (erectile dysfunction)   . Fatigue   . Headache   . Heart attack ([place])   . Herpes genitalia   . Hyperlipidemia   . Hypertension   . Hypogonadism in male   . Neuropathy   . Obesity   . SOB (shortness of breath)     Surgical History: Past Surgical History:  Procedure Laterality Date  . back surgeries     x 4  . CORONARY ANGIOPLASTY  01/22/2011   stent    Home Medications:  Allergies as of 10/20/2017      Reactions   Codeine    Makes me sick       Medication List        Accurate as of 10/20/17 11:59 PM. Always use your most  recent med list.          amLODipine 10 MG tablet Commonly known as:  NORVASC Take 1 tablet (10 mg total) by mouth daily.   aspirin 81 MG EC tablet Take 81 mg by mouth daily.   clopidogrel 75 MG tablet Commonly known as:  PLAVIX Take 1 tablet (75 mg total) by mouth daily.   nitroGLYCERIN 0.4 MG SL tablet Commonly known as:  NITROSTAT Place 1 tablet (0.4 mg total) under the tongue every 5 (five) minutes as needed.   tamsulosin 0.4 MG Caps capsule Commonly known as:  FLOMAX Take 1 capsule (0.4 mg total) by mouth daily.   valACYclovir 1000 MG tablet Commonly known as:  VALTREX Take 1 tablet (1,000 mg total) by mouth daily.       Allergies:  Allergies  Allergen Reactions  . Codeine     Makes me sick     Family History: Family History  Adopted: Yes  Problem Relation Age of Onset  . Prostate cancer Neg Hx     Social History:  reports that he has been smoking cigarettes.  He has been smoking about 1.00 pack per day. he has never used smokeless tobacco. He reports that he  uses drugs. Drug: Marijuana. He reports that he does not drink alcohol.  ROS: UROLOGY Frequent Urination?: Yes Hard to postpone urination?: No Burning/pain with urination?: No Get up at night to urinate?: Yes Leakage of urine?: No Urine stream starts and stops?: No Trouble starting stream?: No Do you have to strain to urinate?: No Blood in urine?: No Urinary tract infection?: No Sexually transmitted disease?: Yes Injury to kidneys or bladder?: No Painful intercourse?: No Weak stream?: No Erection problems?: No Penile pain?: No  Gastrointestinal Nausea?: No Vomiting?: No Indigestion/heartburn?: No Diarrhea?: No Constipation?: No  Constitutional Fever: No Night sweats?: Yes Weight loss?: No Fatigue?: Yes  Skin Skin rash/lesions?: No Itching?: No  Eyes Blurred vision?: No Double vision?: No  Ears/Nose/Throat Sore throat?: No Sinus problems?:  No  Hematologic/Lymphatic Swollen glands?: No Easy bruising?: No  Cardiovascular Leg swelling?: No Chest pain?: Yes  Respiratory Cough?: No Shortness of breath?: No  Endocrine Excessive thirst?: No  Musculoskeletal Back pain?: Yes Joint pain?: Yes  Neurological Headaches?: No Dizziness?: No  Psychologic Depression?: Yes Anxiety?: Yes  Physical Exam: BP 121/74   Pulse 77   Ht 5\' 10"  (1.778 m)   Wt 205 lb (93 kg)   BMI 29.41 kg/m   Constitutional:  Alert and oriented, No acute distress.  Accompanied by girlfriend today. HEENT: [place] AT, moist mucus membranes.  Trachea midline, no masses. Cardiovascular: No clubbing, cyanosis, or edema. Respiratory: Normal respiratory effort, no increased work of breathing. Skin: No rashes, bruises or suspicious lesions. Neurologic: Grossly intact, no focal deficits, moving all 4 extremities. Psychiatric: Somewhat anxious.    Laboratory Data: Lab Results  Component Value Date   WBC 15.1 (H) 08/18/2016   HGB 16.5 08/18/2016   HCT 47.8 08/18/2016   MCV 89.9 08/18/2016   PLT 343.0 08/18/2016    Lab Results  Component Value Date   CREATININE 1.21 08/18/2016    Component     Latest Ref Rng & Units 03/12/2016 04/11/2016 08/07/2016 08/07/2016           2:04 PM  2:04 PM  Prostate Specific Ag, Serum     0.0 - 4.0 ng/mL 5.2 (H) 3.4 5.4 (H) 5.4 (H)   Component     Latest Ref Rng & Units 12/18/2016 04/24/2017 09/24/2017            Prostate Specific Ag, Serum     0.0 - 4.0 ng/mL 3.5 2.7 3.6    Assessment & Plan:    1. Cancer of prostate with low recurrence risk (stage T1-2a and Gleason < 7 and PSA < 10) (HCC) Recommend continuation of active surveillance Overall, he continues to fall within the low risk category albeit with fairly high volume disease particularly on the left which is somewhat concerning We discussed consideration of prostate MRI, which strongly consider in future if PSA begins to rise or rectal exam  changes Patient/his girlfriend have also been reading about genetic testing on the specimen which could further help risk stratify him- will pursue this  2. BPH with obstruction/lower urinary tract symptoms Continue flomax  Return in about 6 months (around 04/19/2018) for PSA/ DRE.  Hollice Espy, MD  [facility] [address] [place], [place] [address] [phone]

## 2017-10-29 ENCOUNTER — Other Ambulatory Visit: Payer: Self-pay | Admitting: Cardiovascular Disease

## 2017-10-29 DIAGNOSIS — M79602 Pain in left arm: Secondary | ICD-10-CM

## 2017-11-02 ENCOUNTER — Encounter: Payer: Self-pay | Admitting: Urology

## 2017-11-16 LAB — IHC MULTIPLEX X1 TECH

## 2017-11-16 LAB — IHC MULTIPLEX X1 PROF

## 2017-11-23 LAB — PATHOLOGY REPORT

## 2017-12-02 ENCOUNTER — Other Ambulatory Visit: Payer: Self-pay

## 2017-12-06 ENCOUNTER — Telehealth: Payer: Self-pay | Admitting: Family Medicine

## 2017-12-06 DIAGNOSIS — E782 Mixed hyperlipidemia: Secondary | ICD-10-CM

## 2017-12-06 DIAGNOSIS — I10 Essential (primary) hypertension: Secondary | ICD-10-CM

## 2017-12-06 DIAGNOSIS — D72829 Elevated white blood cell count, unspecified: Secondary | ICD-10-CM

## 2017-12-06 NOTE — Telephone Encounter (Signed)
-----   Message from Ellamae Sia sent at 12/02/2017 11:55 AM EST ----- Regarding: Lab orders for Wednesday, 2.27.19 Patient is scheduled for CPX labs, please order future labs, Thanks , Karna Christmas

## 2017-12-08 ENCOUNTER — Encounter: Payer: Self-pay | Admitting: Family Medicine

## 2017-12-09 ENCOUNTER — Other Ambulatory Visit (INDEPENDENT_AMBULATORY_CARE_PROVIDER_SITE_OTHER): Payer: PRIVATE HEALTH INSURANCE

## 2017-12-09 DIAGNOSIS — E782 Mixed hyperlipidemia: Secondary | ICD-10-CM

## 2017-12-09 DIAGNOSIS — I10 Essential (primary) hypertension: Secondary | ICD-10-CM | POA: Diagnosis not present

## 2017-12-09 DIAGNOSIS — D72829 Elevated white blood cell count, unspecified: Secondary | ICD-10-CM

## 2017-12-09 LAB — LIPID PANEL
CHOLESTEROL: 201 mg/dL — AB (ref 0–200)
HDL: 26.9 mg/dL — AB (ref >39.00)
LDL Cholesterol: 145 mg/dL — ABNORMAL HIGH (ref 0–99)
NonHDL: 174.41
TRIGLYCERIDES: 145 mg/dL (ref 0.0–149.0)
Total CHOL/HDL Ratio: 7
VLDL: 29 mg/dL (ref 0.0–40.0)

## 2017-12-09 LAB — COMPREHENSIVE METABOLIC PANEL
ALBUMIN: 4.1 g/dL (ref 3.5–5.2)
ALK PHOS: 62 U/L (ref 39–117)
ALT: 21 U/L (ref 0–53)
AST: 18 U/L (ref 0–37)
BUN: 20 mg/dL (ref 6–23)
CALCIUM: 10 mg/dL (ref 8.4–10.5)
CO2: 28 mEq/L (ref 19–32)
Chloride: 106 mEq/L (ref 96–112)
Creatinine, Ser: 1.22 mg/dL (ref 0.40–1.50)
GFR: 65.98 mL/min (ref >60.00)
Glucose, Bld: 103 mg/dL — ABNORMAL HIGH (ref 70–99)
POTASSIUM: 4.2 meq/L (ref 3.5–5.1)
SODIUM: 139 meq/L (ref 135–145)
TOTAL PROTEIN: 7.3 g/dL (ref 6.0–8.3)
Total Bilirubin: 0.5 mg/dL (ref 0.2–1.2)

## 2017-12-09 LAB — CBC WITH DIFFERENTIAL/PLATELET
Basophils Absolute: 0.1 10*3/uL (ref 0.0–0.1)
Basophils Relative: 0.5 % (ref 0.0–3.0)
EOS PCT: 1.7 % (ref 0.0–5.0)
Eosinophils Absolute: 0.2 10*3/uL (ref 0.0–0.7)
HCT: 49.7 % (ref 39.0–52.0)
HEMOGLOBIN: 17 g/dL (ref 13.0–17.0)
Lymphocytes Relative: 28.2 % (ref 12.0–46.0)
Lymphs Abs: 4 10*3/uL (ref 0.7–4.0)
MCHC: 34.2 g/dL (ref 30.0–36.0)
MCV: 89.6 fl (ref 78.0–100.0)
MONO ABS: 0.9 10*3/uL (ref 0.1–1.0)
Monocytes Relative: 6.4 % (ref 3.0–12.0)
Neutro Abs: 9 10*3/uL — ABNORMAL HIGH (ref 1.4–7.7)
Neutrophils Relative %: 63.2 % (ref 43.0–77.0)
Platelets: 313 10*3/uL (ref 150.0–400.0)
RBC: 5.55 Mil/uL (ref 4.22–5.81)
RDW: 13.7 % (ref 11.5–15.5)
WBC: 14.3 10*3/uL — AB (ref 4.0–10.5)

## 2017-12-09 LAB — TSH: TSH: 2.54 u[IU]/mL (ref 0.35–4.50)

## 2017-12-10 LAB — PATHOLOGIST SMEAR REVIEW

## 2017-12-14 LAB — PROMARK(R)

## 2017-12-15 ENCOUNTER — Other Ambulatory Visit: Payer: Self-pay | Admitting: Urology

## 2017-12-16 ENCOUNTER — Encounter: Payer: Self-pay | Admitting: Cardiovascular Disease

## 2017-12-16 ENCOUNTER — Ambulatory Visit (INDEPENDENT_AMBULATORY_CARE_PROVIDER_SITE_OTHER): Payer: PRIVATE HEALTH INSURANCE | Admitting: Family Medicine

## 2017-12-16 ENCOUNTER — Encounter: Payer: Self-pay | Admitting: Gastroenterology

## 2017-12-16 ENCOUNTER — Encounter: Payer: Self-pay | Admitting: Family Medicine

## 2017-12-16 VITALS — BP 126/78 | HR 56 | Temp 97.9°F | Ht 69.5 in | Wt 207.0 lb

## 2017-12-16 DIAGNOSIS — Z0000 Encounter for general adult medical examination without abnormal findings: Secondary | ICD-10-CM

## 2017-12-16 DIAGNOSIS — E782 Mixed hyperlipidemia: Secondary | ICD-10-CM | POA: Diagnosis not present

## 2017-12-16 DIAGNOSIS — Z23 Encounter for immunization: Secondary | ICD-10-CM

## 2017-12-16 DIAGNOSIS — F331 Major depressive disorder, recurrent, moderate: Secondary | ICD-10-CM | POA: Insufficient documentation

## 2017-12-16 DIAGNOSIS — I25118 Atherosclerotic heart disease of native coronary artery with other forms of angina pectoris: Secondary | ICD-10-CM | POA: Diagnosis not present

## 2017-12-16 DIAGNOSIS — I10 Essential (primary) hypertension: Secondary | ICD-10-CM

## 2017-12-16 DIAGNOSIS — D72829 Elevated white blood cell count, unspecified: Secondary | ICD-10-CM | POA: Diagnosis not present

## 2017-12-16 DIAGNOSIS — F17200 Nicotine dependence, unspecified, uncomplicated: Secondary | ICD-10-CM | POA: Diagnosis not present

## 2017-12-16 DIAGNOSIS — F418 Other specified anxiety disorders: Secondary | ICD-10-CM | POA: Diagnosis not present

## 2017-12-16 DIAGNOSIS — C61 Malignant neoplasm of prostate: Secondary | ICD-10-CM | POA: Diagnosis not present

## 2017-12-16 DIAGNOSIS — IMO0001 Reserved for inherently not codable concepts without codable children: Secondary | ICD-10-CM

## 2017-12-16 DIAGNOSIS — Z7902 Long term (current) use of antithrombotics/antiplatelets: Secondary | ICD-10-CM | POA: Insufficient documentation

## 2017-12-16 DIAGNOSIS — Z1211 Encounter for screening for malignant neoplasm of colon: Secondary | ICD-10-CM

## 2017-12-16 NOTE — Progress Notes (Signed)
Subjective:    Patient ID: Charles Holder, male    DOB: 15-Jul-1966, 52 y.o.   MRN: [id number]  HPI Here for health maintenance exam and to review chronic medical problems    Overall feeling and doing better  Now has a girlfriend who is very supportive   Wt Readings from Last 3 Encounters:  12/16/17 207 lb (93.9 kg)  10/20/17 205 lb (93 kg)  09/24/17 200 lb (90.7 kg)  eating a lot better  Drinks a lot of sweet tea - no water (cutting back on the sugar he adds)  No etoh  30.13 kg/m   Declines flu shots   Tetanus shot 6/08 -due for  10 year   Colon cancer screening -last colonoscopy was over 10 years ago  Wants to get set up for a screening colonoscopy   HIV screen 7/18   Smoking status -about a 1 ppd  Not ready to quit  No copd or breathing symptoms  Given handout on the lung cancer screening program   Prostate cancer - goes to urology  Adopted - is getting genetic testing  More or less monitoring it - after 2 biopsy    bp is stable today  Has HTN in setting of CAD Takes amlodipine  Also plavix and prn nitro  No cp or palpitations or headaches or edema  No side effects to medicines  BP Readings from Last 3 Encounters:  12/16/17 126/78  10/20/17 121/74  09/24/17 115/70    Dr Rockey Situ sees him     H/o chronic back pain   Also depression with anxiety   Hyperlipidemia Lab Results  Component Value Date   CHOL 201 (H) 12/09/2017   CHOL 202 (H) 08/18/2016   CHOL 177 12/16/2013   Lab Results  Component Value Date   HDL 26.90 (L) 12/09/2017   HDL 28.50 (L) 08/18/2016   HDL 30 (L) 12/16/2013   Lab Results  Component Value Date   LDLCALC 145 (H) 12/09/2017   LDLCALC 133 (H) 08/18/2016   LDLCALC 117 (H) 12/16/2013   Lab Results  Component Value Date   TRIG 145.0 12/09/2017   TRIG 200.0 (H) 08/18/2016   TRIG 148 12/16/2013   Lab Results  Component Value Date   CHOLHDL 7 12/09/2017   CHOLHDL 7 08/18/2016   CHOLHDL 5.9 (H) 12/16/2013   No  results found for: LDLDIRECT Intolerant of statins  Has not discussed  Used to eat a lot of junk - now much better  Lean meat and veg   Hx of leukocytosis  Lab Results  Component Value Date   WBC 14.3 (H) 12/09/2017   HGB 17.0 12/09/2017   HCT 49.7 12/09/2017   MCV 89.6 12/09/2017   PLT 313.0 12/09/2017    Path review consistent with reactive process  Path Review   Comment: Absolute lymphocytosis and neutrophilia with mild left shift  in maturation. Favor reactive process for both. Recommend  clinical correlation. RBCs are unremarkable. Platelet clumps noted on  smear-count appears increased.   Lab Results  Component Value Date   CREATININE 1.22 12/09/2017   BUN 20 12/09/2017   NA 139 12/09/2017   K 4.2 12/09/2017   CL 106 12/09/2017   CO2 28 12/09/2017    Lab Results  Component Value Date   ALT 21 12/09/2017   AST 18 12/09/2017   ALKPHOS 62 12/09/2017   BILITOT 0.5 12/09/2017   glucose 103  Mildly high  Does eat sweets/sugar   Lab Results  Component Value Date   TSH 2.54 12/09/2017     Patient Active Problem List   Diagnosis Date Noted  . Routine general medical examination at a health care facility 12/16/2017  . Moderate episode of recurrent major depressive disorder ([place]) 12/16/2017  . Colon cancer screening 12/16/2017  . Leukocytosis 08/20/2016  . Depression with anxiety 08/18/2016  . Elevated PSA 08/18/2016  . BPH with obstruction/lower urinary tract symptoms 03/14/2016  . Herpes 03/14/2016  . Essential hypertension 08/31/2012  . CAD (coronary artery disease) 02/07/2011  . Stented coronary artery 02/07/2011  . Smoking 02/07/2011  . Mixed hyperlipidemia 02/07/2011  . Chest pain 01/16/2010  . Chronic back pain 06/05/2008   Past Medical History:  Diagnosis Date  . Adopted   . Anxiety   . Atrial fibrillation ([place])   . BPH (benign prostatic hyperplasia)   . Cardiomyopathy, ischemic 01/22/2011   EF 50%  . Chronic lower back pain    s/p L5-S1  .  Coronary artery disease   . Depression   . ED (erectile dysfunction)   . Fatigue   . Headache   . Heart attack ([place])   . Herpes genitalia   . Hyperlipidemia   . Hypertension   . Hypogonadism in male   . Neuropathy   . Obesity   . SOB (shortness of breath)    Past Surgical History:  Procedure Laterality Date  . back surgeries     x 4  . CORONARY ANGIOPLASTY  01/22/2011   stent   Social History   Tobacco Use  . Smoking status: Current Every Day Smoker    Packs/day: 1.00    Types: Cigarettes  . Smokeless tobacco: Never Used  Substance Use Topics  . Alcohol use: No  . Drug use: Yes    Types: Marijuana   Family History  Adopted: Yes  Problem Relation Age of Onset  . Prostate cancer Neg Hx    Allergies  Allergen Reactions  . Codeine     Makes me sick    Current Outpatient Medications on File Prior to Visit  Medication Sig Dispense Refill  . amLODipine (NORVASC) 10 MG tablet TAKE ONE TABLET EVERY DAY 90 tablet 3  . aspirin 81 MG EC tablet Take 81 mg by mouth daily.      . clopidogrel (PLAVIX) 75 MG tablet TAKE ONE TABLET EVERY DAY 90 tablet 3  . nitroGLYCERIN (NITROSTAT) 0.4 MG SL tablet Place 1 tablet (0.4 mg total) under the tongue every 5 (five) minutes as needed. 25 tablet 1  . valACYclovir (VALTREX) 1000 MG tablet Take 1 tablet (1,000 mg total) by mouth daily. 90 tablet 4   No current facility-administered medications on file prior to visit.     Review of Systems  Constitutional: Negative for activity change, appetite change, fatigue, fever and unexpected weight change.  HENT: Negative for congestion, rhinorrhea, sore throat and trouble swallowing.   Eyes: Negative for pain, redness, itching and visual disturbance.  Respiratory: Negative for cough, chest tightness, shortness of breath and wheezing.   Cardiovascular: Negative for chest pain and palpitations.       No cp currently  Gastrointestinal: Negative for abdominal pain, blood in stool, constipation,  diarrhea and nausea.  Endocrine: Negative for cold intolerance, heat intolerance, polydipsia and polyuria.  Genitourinary: Positive for frequency. Negative for difficulty urinating, dysuria and urgency.  Musculoskeletal: Negative for arthralgias, joint swelling and myalgias.  Skin: Negative for pallor and rash.  Neurological: Negative for dizziness, tremors, weakness, numbness and headaches.  Hematological: Negative for adenopathy. Does not bruise/bleed easily.  Psychiatric/Behavioral: Negative for decreased concentration and dysphoric mood. The patient is not nervous/anxious.        Mood is much improved        Objective:   Physical Exam  Constitutional: He appears well-developed and well-nourished. No distress.  overwt but muscular build   HENT:  Head: Normocephalic and atraumatic.  Right Ear: External ear normal.  Left Ear: External ear normal.  Nose: Nose normal.  Mouth/Throat: Oropharynx is clear and moist.  Eyes: Conjunctivae and EOM are normal. Pupils are equal, round, and reactive to light. Right eye exhibits no discharge. Left eye exhibits no discharge. No scleral icterus.  Neck: Normal range of motion. Neck supple. No JVD present. Carotid bruit is not present. No thyromegaly present.  Cardiovascular: Normal rate, regular rhythm, normal heart sounds and intact distal pulses. Exam reveals no gallop.  Pulmonary/Chest: Effort normal and breath sounds normal. No respiratory distress. He has no wheezes. He exhibits no tenderness.  Abdominal: Soft. Bowel sounds are normal. He exhibits no distension, no abdominal bruit and no mass. There is no tenderness.  Musculoskeletal: He exhibits no edema or tenderness.  Lymphadenopathy:    He has no cervical adenopathy.  Neurological: He is alert. He has normal reflexes. No cranial nerve deficit. He exhibits normal muscle tone. Coordination normal.  Skin: Skin is warm and dry. No rash noted. No erythema. No pallor.  Solar lentigines  diffusely Some solar aging  Psychiatric: He has a normal mood and affect.  Cheerful Much improved affect           Assessment & Plan:   Problem List Items Addressed This Visit      Cardiovascular and Mediastinum   CAD (coronary artery disease)    With a stent Sees cardiology  Smokes Unfortunately intol to all statins       Essential hypertension    bp in fair control at this time  BP Readings from Last 1 Encounters:  12/16/17 126/78   No changes needed-amlodipine Also cardiology f/u  Disc lifstyle change with low sodium diet and exercise  Labs reviewed         Genitourinary   Prostate cancer ([place])    Followed closely by urology Several bx  No tx currently - obs  Pt is clinically doing well         Other   Colon cancer screening    Referred for first screening colonoscopy      Relevant Orders   Ambulatory referral to Gastroenterology   RESOLVED: Depression with anxiety    This is much improved Reviewed stressors/ coping techniques/symptoms/ support sources/ tx options and side effects in detail today Social situation is better- has a girl       Leukocytosis    Reviewed CBC-stable  Path review consistent with reactive process Pt is smoker Also fighting prostate cancer No s/s of infection  Continue to watch        Mixed hyperlipidemia    Disc goals for lipids and reasons to control them Rev labs with pt (worrisome in light of CAD) Rev low sat fat diet in detail Info given  Intol of statin  Will disc poss of zetia with cardiology at his visit next mo  Doubt he could get financial help with PCYK9 inhibitor but may be option later if affordable       RESOLVED: Moderate episode of recurrent major depressive disorder ([place])   Routine general medical examination at  a health care facility - Primary    Reviewed health habits including diet and exercise and skin cancer prevention Reviewed appropriate screening tests for age  Also reviewed health mt  list, fam hx and immunization status , as well as social and family history   See HPI Labs reviewed  Declines flu vaccine (urged to re consider) Tdap today  Enc to quit smoking  Screening colonoscopy scheduled  Diet for chol/ prediabetes disc and info given (this is improving) Enc him to grad cut out sweet tea       Smoking    Disc in detail risks of smoking and possible outcomes including copd, vascular/ heart disease, cancer , respiratory and sinus infections  Pt voices understanding Pt is not interested in quitting Info given on CT screening program to consider  No copd symptoms currently  Has CAD unfortunately       Other Visit Diagnoses    Need for Tdap vaccination       Relevant Orders   Tdap vaccine greater than or equal to 7yo IM (Completed)

## 2017-12-16 NOTE — Patient Instructions (Addendum)
Try to gradually change over from sweet tea to water - it will help you feel better and also keep your kidneys healthy   Tetanus shot today   For cholesterol  Avoid red meat/ fried foods/ egg yolks/ fatty breakfast meats/ butter, cheese and high fat dairy/ and shellfish    Glucose is mildly high We need to watch you for diabetes in the future  Try to get most of your carbohydrates from produce (with the exception of white potatoes)  Eat less bread/pasta/rice/snack foods/cereals/sweets and other items from the middle of the grocery store (processed carbs)   We will refer you for a screening colonoscopy

## 2017-12-17 ENCOUNTER — Encounter: Payer: Self-pay | Admitting: Cardiovascular Disease

## 2017-12-17 NOTE — Assessment & Plan Note (Signed)
Disc in detail risks of smoking and possible outcomes including copd, vascular/ heart disease, cancer , respiratory and sinus infections  Pt voices understanding Pt is not interested in quitting Info given on CT screening program to consider  No copd symptoms currently  Has CAD unfortunately

## 2017-12-17 NOTE — Assessment & Plan Note (Signed)
bp in fair control at this time  BP Readings from Last 1 Encounters:  12/16/17 126/78   No changes needed-amlodipine Also cardiology f/u  Disc lifstyle change with low sodium diet and exercise  Labs reviewed

## 2017-12-17 NOTE — Assessment & Plan Note (Signed)
Followed closely by urology Several bx  No tx currently - obs  Pt is clinically doing well

## 2017-12-17 NOTE — Assessment & Plan Note (Signed)
Disc goals for lipids and reasons to control them Rev labs with pt (worrisome in light of CAD) Rev low sat fat diet in detail Info given  Intol of statin  Will disc poss of zetia with cardiology at his visit next mo  Doubt he could get financial help with PCYK9 inhibitor but may be option later if affordable

## 2017-12-17 NOTE — Assessment & Plan Note (Signed)
Reviewed CBC-stable  Path review consistent with reactive process Pt is smoker Also fighting prostate cancer No s/s of infection  Continue to watch

## 2017-12-17 NOTE — Assessment & Plan Note (Signed)
With a stent Sees cardiology  Smokes Unfortunately intol to all statins

## 2017-12-17 NOTE — Assessment & Plan Note (Signed)
This is much improved Reviewed stressors/ coping techniques/symptoms/ support sources/ tx options and side effects in detail today Social situation is better- has a girl

## 2017-12-17 NOTE — Assessment & Plan Note (Signed)
Reviewed health habits including diet and exercise and skin cancer prevention Reviewed appropriate screening tests for age  Also reviewed health mt list, fam hx and immunization status , as well as social and family history   See HPI Labs reviewed  Declines flu vaccine (urged to re consider) Tdap today  Enc to quit smoking  Screening colonoscopy scheduled  Diet for chol/ prediabetes disc and info given (this is improving) Enc him to grad cut out sweet tea

## 2017-12-17 NOTE — Assessment & Plan Note (Signed)
Referred for first screening colonoscopy  

## 2018-01-05 ENCOUNTER — Telehealth: Payer: Self-pay | Admitting: Emergency Medicine

## 2018-01-05 ENCOUNTER — Ambulatory Visit (INDEPENDENT_AMBULATORY_CARE_PROVIDER_SITE_OTHER): Payer: Medicare PPO | Admitting: Gastroenterology

## 2018-01-05 ENCOUNTER — Encounter: Payer: Self-pay | Admitting: Gastroenterology

## 2018-01-05 VITALS — BP 118/70 | HR 76 | Ht 69.5 in | Wt 207.2 lb

## 2018-01-05 DIAGNOSIS — Z1211 Encounter for screening for malignant neoplasm of colon: Secondary | ICD-10-CM

## 2018-01-05 DIAGNOSIS — Z7902 Long term (current) use of antithrombotics/antiplatelets: Secondary | ICD-10-CM

## 2018-01-05 MED ORDER — NA SULFATE-K SULFATE-MG SULF 17.5-3.13-1.6 GM/177ML PO SOLN: 1.0000 | ORAL | 0 refills | Status: DC

## 2018-01-05 NOTE — Progress Notes (Signed)
01/05/2018 [facility] RIO [id number] 1966/07/24   HISTORY OF PRESENT ILLNESS: This is a pleasant 52 year old male who is new to our office.  He is a patient of Dr. Marliss Coots who was referred here to discuss screening colonoscopy.  He had a colonoscopy in 2003 at which time he was found to have internal hemorrhoids only.  He is on Plavix for history of coronary artery disease and stents, but has been on that since 2011.  He says that he has held the Plavix in the past when he had his prostate biopsy, etc. without issues.  His cardiologist is Dr. Rockey Situ.  He does not know his family history as he was adopted.  He denies any rectal bleeding or issues with moving his bowels.   Past Medical History:  Diagnosis Date  . Adopted   . Anxiety   . Atrial fibrillation ([place])   . BPH (benign prostatic hyperplasia)   . Cardiomyopathy, ischemic 01/22/2011   EF 50%  . Chronic lower back pain    s/p L5-S1  . Coronary artery disease   . Depression   . ED (erectile dysfunction)   . Fatigue   . Headache   . Heart attack ([place])   . Herpes genitalia   . Hyperlipidemia   . Hypertension   . Hypogonadism in male   . Neuropathy   . Obesity   . Prostate cancer ([place])   . SOB (shortness of breath)    Past Surgical History:  Procedure Laterality Date  . back surgeries     x 4  . CORONARY ANGIOPLASTY  01/22/2011   stent    reports that he has been smoking cigarettes.  He has been smoking about 1.00 pack per day. He has never used smokeless tobacco. He reports that he has current or past drug history. Drug: Marijuana. He reports that he does not drink alcohol. family history is not on file. He was adopted. Allergies  Allergen Reactions  . Codeine     Makes me sick       Outpatient Encounter Medications as of 01/05/2018  Medication Sig  . amLODipine (NORVASC) 10 MG tablet TAKE ONE TABLET EVERY DAY  . aspirin 81 MG EC tablet Take 81 mg by mouth daily.    . clopidogrel (PLAVIX) 75 MG tablet TAKE  ONE TABLET EVERY DAY  . Multiple Vitamins-Minerals (MULTIVITAMIN ADULT PO) Take by mouth.  . nitroGLYCERIN (NITROSTAT) 0.4 MG SL tablet Place 1 tablet (0.4 mg total) under the tongue every 5 (five) minutes as needed.  . Omega-3 Fatty Acids (FISH OIL) 1000 MG CAPS Take by mouth.  . Probiotic Product (PROBIOTIC ADVANCED PO) Take by mouth.  . valACYclovir (VALTREX) 1000 MG tablet Take 1 tablet (1,000 mg total) by mouth daily.   No facility-administered encounter medications on file as of 01/05/2018.      REVIEW OF SYSTEMS  : All other systems reviewed and negative except where noted in the History of Present Illness.   PHYSICAL EXAM: BP 118/70   Pulse 76   Ht 5' 9.5" (1.765 m)   Wt 207 lb 3.2 oz (94 kg)   SpO2 95%   BMI 30.16 kg/m  General: Well developed white male in no acute distress Head: Normocephalic and atraumatic Eyes:  Sclerae anicteric, conjunctiva pink. Ears: Normal auditory acuity Lungs: Clear throughout to auscultation; no increased WOB. Heart: Regular rate and rhythm; no M/R/G. Abdomen: Soft, non-distended.  BS present.  Non-tender.  Small umbilical hernia noted. Rectal:  Will be done at the time of colonoscopy. Musculoskeletal: Symmetrical with no gross deformities  Skin: No lesions on visible extremities Extremities: No edema  Neurological: Alert oriented x 4, grossly non-focal Psychological:  Alert and cooperative. Normal mood and affect  ASSESSMENT AND PLAN: *Screening colonoscopy:  Will schedule with Dr. Loletha Carrow. *Chronic antiplatelet use with Plavix due to CAD with stent:  Hold Plavix for 5 days before procedure - will instruct when and how to resume after procedure. Risks and benefits of procedure including bleeding, perforation, infection, missed lesions, medication reactions and possible hospitalization or surgery if complications occur explained. Additional rare but real risk of cardiovascular event such as heart attack or ischemia/infarct of other organs off  of Plavix explained and need to seek urgent help if this occurs. Will communicate by phone or EMR with patient's prescribing provider, Dr. Rockey Situ to confirm that holding Plavix is reasonable in this case.    CC:  Tower, Wynelle Fanny, MD

## 2018-01-05 NOTE — Telephone Encounter (Signed)
Request for surgical clearance: Endoscopy Procedure  What type of surgery is being performed? Colonoscopy  When is surgery scheduled? 01-13-18  What type of clearance is required? Pharmacy  Are there any medications that need to be held prior to surgery and how long? Plavix 5 days  Practice and name of physician performing surgery? [facility], Dr. Wilfrid Lund  What is your office fax and phone number? Phone [phone] and Fax [phone]  Anesthesia type (none, local, MAC, general): MAC

## 2018-01-05 NOTE — Patient Instructions (Signed)

## 2018-01-06 NOTE — Progress Notes (Signed)
Thank you for sending this case to me. I have reviewed the entire note, and the outlined plan seems appropriate.  I reviewed his last Cardiology office note from 02/2017.  He is appropriate for colonoscopy in [place].  Last coronary stent in 2012.  Yes, check with Cardiology regarding the plavix, but I suspect that will be agreeable to holding it 5 days prior.  Wilfrid Lund, MD

## 2018-01-06 NOTE — Telephone Encounter (Signed)
   Primary Cardiologist:Timothy Rockey Situ, MD  Chart reviewed as part of pre-operative protocol coverage. Because of Charles Holder's past medical history and time since last visit (02/23/17) he will require a follow-up visit in order to better assess preoperative cardiovascular risk.  Pre-op covering staff: - Please schedule appointment and call patient to inform them. - Please contact requesting surgeon's office via preferred method (i.e, phone, fax) to inform them of need for appointment prior to surgery.  Richardson Dopp, PA-C  01/06/2018, 4:34 PM

## 2018-01-07 ENCOUNTER — Telehealth: Payer: Self-pay | Admitting: Cardiovascular Disease

## 2018-01-07 NOTE — Telephone Encounter (Signed)
I called pt to advise he would need an appt before he could be cleared for his procedure. Pt states someone from our office just called him a few minutes earlier. I apologized for my call. Pt said that was fine and thank you.

## 2018-01-07 NOTE — Telephone Encounter (Signed)
I spoke with the patient and advised him I can move his appointment up to Tuesday 01/12/18 at 2:20 pm or 4:00 pm. The patient requested to be seen at 4:00 pm.  Appointment- scheduled for 4/2 at 4:00 pm.

## 2018-01-07 NOTE — Telephone Encounter (Signed)
Patient calling office to be seen for colonoscopy clearance .  Procedure is scheduled for 4/3 and next available is 4/9 with Dr. Rockey Situ .  Patient wants to know if he can be worked In soon

## 2018-01-08 ENCOUNTER — Telehealth: Payer: Self-pay | Admitting: Gastroenterology

## 2018-01-08 NOTE — Telephone Encounter (Signed)
Thanks Desiree 

## 2018-01-08 NOTE — Telephone Encounter (Signed)
Just FYI  Called patient and spoke to him about rescheduling his procedure possibly for April 10th. He was adamant that he wants to go ahead and get it done and he has an appointment with cardiology. I explained to him that he still may need to wait 5 days after that office visit to hold Plavix. He states he has not taken any Plavix for 2 days and he knows that his cardiologist will be ok with this. I informed him that we need to wait and that he needs a doctor to stop his Plavix but he was adamant to have his colon next week.

## 2018-01-08 NOTE — Telephone Encounter (Signed)
Patient is scheduled for colonoscopy on 4/3 at [facility] scheduled to see his cardiologist on 4/2. Spoke to patient, it has been over one year since he has seen his cardiologist. It is not clear if the cardiologist office has contacted [place] to say if it is okay for patient to hold Plavix, Desiree do you have any information from them?

## 2018-01-08 NOTE — Telephone Encounter (Signed)
Yes I spoke to patient yesterday and he was going to call his cardiologist and make an appointment to be seen but that we will need to reschedule his colon until after he sees his cardiologist and they deem he ok to hold Plavix for 5 days. I will call him to reschedule his appointment for colonoscopy.

## 2018-01-10 NOTE — Telephone Encounter (Signed)
He is ok to hold the plavix Would continue asa 81 daily if that is ok I can seen him on 4/2,  Suspect he will be ok for procedure on 4/3 if no new sx or EKG changes thx Tgollan
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
## 2018-01-11 NOTE — Telephone Encounter (Signed)
Thank you for the info - I am just receiving it after two days out of the office.  As Dr. Rockey Situ has been good enough to advise re: holding the plavix and continuing daily 81mg  aspirin, we will proceed with colonoscopy 4/3 unless we hear otherwise from Dr. Rockey Situ after the 4/2 office visit.

## 2018-01-11 NOTE — Telephone Encounter (Signed)
Spoke to [place], wife, let her know that we will await Dr. Rockey Situ evaluation on 4/2, let her know that will continue with planned procedure for 4/3 unless we hear differently.

## 2018-01-12 ENCOUNTER — Ambulatory Visit (INDEPENDENT_AMBULATORY_CARE_PROVIDER_SITE_OTHER): Payer: Medicare PPO | Admitting: Cardiovascular Disease

## 2018-01-12 ENCOUNTER — Encounter: Payer: Self-pay | Admitting: Urology

## 2018-01-12 ENCOUNTER — Ambulatory Visit (INDEPENDENT_AMBULATORY_CARE_PROVIDER_SITE_OTHER): Payer: Medicare PPO | Admitting: Urology

## 2018-01-12 ENCOUNTER — Encounter: Payer: Self-pay | Admitting: Cardiovascular Disease

## 2018-01-12 VITALS — BP 124/80 | HR 57 | Ht 69.5 in | Wt 206.5 lb

## 2018-01-12 VITALS — BP 118/71 | HR 66 | Ht 69.0 in | Wt 207.0 lb

## 2018-01-12 DIAGNOSIS — I25118 Atherosclerotic heart disease of native coronary artery with other forms of angina pectoris: Secondary | ICD-10-CM

## 2018-01-12 DIAGNOSIS — C61 Malignant neoplasm of prostate: Secondary | ICD-10-CM | POA: Diagnosis not present

## 2018-01-12 DIAGNOSIS — I10 Essential (primary) hypertension: Secondary | ICD-10-CM

## 2018-01-12 DIAGNOSIS — N138 Other obstructive and reflux uropathy: Secondary | ICD-10-CM

## 2018-01-12 DIAGNOSIS — F17200 Nicotine dependence, unspecified, uncomplicated: Secondary | ICD-10-CM | POA: Diagnosis not present

## 2018-01-12 DIAGNOSIS — E7800 Pure hypercholesterolemia, unspecified: Secondary | ICD-10-CM

## 2018-01-12 DIAGNOSIS — G8929 Other chronic pain: Secondary | ICD-10-CM

## 2018-01-12 DIAGNOSIS — M5440 Lumbago with sciatica, unspecified side: Secondary | ICD-10-CM

## 2018-01-12 DIAGNOSIS — R079 Chest pain, unspecified: Secondary | ICD-10-CM

## 2018-01-12 DIAGNOSIS — N401 Enlarged prostate with lower urinary tract symptoms: Secondary | ICD-10-CM | POA: Diagnosis not present

## 2018-01-12 MED ORDER — EZETIMIBE 10 MG PO TABS: 10.0000 mg | ORAL_TABLET | Freq: Every day | ORAL | 11 refills | Status: DC

## 2018-01-12 NOTE — Progress Notes (Signed)
01/12/2018 8:32 AM   Charles Holder 1966/05/29 [id number]  Referring provider: Abner Greenspan, MD [address] [place], [place] [address]  Chief Complaint  Patient presents with  . Results    ProMark    HPI: 52 year old male with a history of low risk cT1c Gleason 3+3 prostate cancer, iPSA 5.4 dx 11/17 s/p recent confirmatory prostate biopsy 09/2017 who returns today to discuss his Promark genetic analysis.  Patient elected to pursue ProMark proteomic prognostic test on his most recent prostate biopsy specimen.  His pro [name] score was 41 which predicts a 24% chance of aggressive disease.  "Aggressive disease" is defined as unfavorable pathology in your prostate, P T3a are T3b disease, or nodal or distant metastases.  On the study, 24% risk is an 11% reduction of the risk predicted from biopsy alone.  Dx 08/2016 Low Risk Prostate Cancer T1c, PSA 5.4, prostate 46 grams Gleason 3+3=6, 4 cores, all left mid and left apex, 57-83 %; +PNI in left mid medial core   Repeat bx 09/24/17 T1c, PSA 3.6, TRUS vol 37.7 Gleason 3+3, 7/12 cores, primarily on left, 77-87% (negligible 1% in single isolated corn right), positive perineural invasion  IPSS 9, mixed - nocturia, frequency  --> improved with Flomax   Mild baseline ED, carries NG    PMHx significant for CAD s/p PCI with stents on ASA/ plavix, cardiomyopathy, DM, HTN, HL, Afib.  He is scheduled for colonoscopy tomorrow.     PMH: Past Medical History:  Diagnosis Date  . Adopted   . Anxiety   . Atrial fibrillation ([place])   . BPH (benign prostatic hyperplasia)   . Cardiomyopathy, ischemic 01/22/2011   EF 50%  . Chronic lower back pain    s/p L5-S1  . Coronary artery disease   . Depression   . ED (erectile dysfunction)   . Fatigue   . Headache   . Heart attack ([place])   . Herpes genitalia   . Hyperlipidemia   . Hypertension   . Hypogonadism in male   . Neuropathy   . Obesity   . Prostate cancer ([place])   . SOB  (shortness of breath)     Surgical History: Past Surgical History:  Procedure Laterality Date  . back surgeries     x 4  . CORONARY ANGIOPLASTY  01/22/2011   stent    Home Medications:  Allergies as of 01/12/2018      Reactions   Codeine    Makes me sick       Medication List        Accurate as of 01/12/18  8:32 AM. Always use your most recent med list.          amLODipine 10 MG tablet Commonly known as:  NORVASC TAKE ONE TABLET EVERY DAY   aspirin 81 MG EC tablet Take 81 mg by mouth daily.   clopidogrel 75 MG tablet Commonly known as:  PLAVIX TAKE ONE TABLET EVERY DAY   Fish Oil 1000 MG Caps Take by mouth.   MULTIVITAMIN ADULT PO Take by mouth.   nitroGLYCERIN 0.4 MG SL tablet Commonly known as:  NITROSTAT Place 1 tablet (0.4 mg total) under the tongue every 5 (five) minutes as needed.   PROBIOTIC ADVANCED PO Take by mouth.   valACYclovir 1000 MG tablet Commonly known as:  VALTREX Take 1 tablet (1,000 mg total) by mouth daily.       Allergies:  Allergies  Allergen Reactions  . Codeine  Makes me sick     Family History: Family History  Adopted: Yes  Problem Relation Age of Onset  . Prostate cancer Neg Hx     Social History:  reports that he has been smoking cigarettes.  He has been smoking about 1.00 pack per day. He has never used smokeless tobacco. He reports that he has current or past drug history. Drug: Marijuana. He reports that he does not drink alcohol.  ROS: UROLOGY Frequent Urination?: Yes Hard to postpone urination?: No Burning/pain with urination?: No Get up at night to urinate?: Yes Leakage of urine?: No Urine stream starts and stops?: No Trouble starting stream?: No Do you have to strain to urinate?: No Blood in urine?: No Urinary tract infection?: No Sexually transmitted disease?: No Injury to kidneys or bladder?: No Painful intercourse?: No Weak stream?: No Erection problems?: No Penile pain?:  No  Gastrointestinal Nausea?: No Vomiting?: No Indigestion/heartburn?: No Diarrhea?: No Constipation?: No  Constitutional Fever: No Night sweats?: Yes Weight loss?: No Fatigue?: No  Skin Skin rash/lesions?: No Itching?: No  Eyes Blurred vision?: No Double vision?: No  Ears/Nose/Throat Sore throat?: No Sinus problems?: No  Hematologic/Lymphatic Swollen glands?: No Easy bruising?: No  Cardiovascular Leg swelling?: No Chest pain?: Yes  Respiratory Cough?: No Shortness of breath?: No  Endocrine Excessive thirst?: No  Musculoskeletal Back pain?: Yes Joint pain?: Yes  Neurological Headaches?: No Dizziness?: No  Psychologic Depression?: No Anxiety?: No  Physical Exam: BP 118/71   Pulse 66   Ht 5\' 9"  (1.753 m)   Wt 207 lb (93.9 kg)   BMI 30.57 kg/m   Constitutional:  Alert and oriented, No acute distress.  Accompanied by girlfriend today. HEENT: [place] AT, moist mucus membranes.  Trachea midline, no masses. Cardiovascular: No clubbing, cyanosis, or edema. Respiratory: Normal respiratory effort, no increased work of breathing. Neurologic: Grossly intact, no focal deficits, moving all 4 extremities. Psychiatric: Normal mood and affect.  Laboratory Data: Lab Results  Component Value Date   WBC 14.3 (H) 12/09/2017   HGB 17.0 12/09/2017   HCT 49.7 12/09/2017   MCV 89.6 12/09/2017   PLT 313.0 12/09/2017    Lab Results  Component Value Date   CREATININE 1.22 12/09/2017    Lab Results  Component Value Date   HGBA1C  01/23/2011    5.5 (NOTE)                                                                       According to the ADA Clinical Practice Recommendations for 2011, when HbA1c is used as a screening test:   >=6.5%   Diagnostic of Diabetes Mellitus           (if abnormal result  is confirmed)  5.7-6.4%   Increased risk of developing Diabetes Mellitus  References:Diagnosis and Classification of Diabetes Mellitus,Diabetes HCWC,3762,83(TDVVO  1):S62-S69 and Standards of Medical Care in         Diabetes - 2011,Diabetes Care,2011,34  (Suppl 1):S11-S61.    Component     Latest Ref Rng & Units 03/12/2016 04/11/2016 08/07/2016 08/07/2016           2:04 PM  2:04 PM  Prostate Specific Ag, Serum     0.0 - 4.0 ng/mL 5.2 (H) 3.4  5.4 (H) 5.4 (H)   Component     Latest Ref Rng & Units 12/18/2016 04/24/2017 09/24/2017            Prostate Specific Ag, Serum     0.0 - 4.0 ng/mL 3.5 2.7 3.6   Promark study   Urinalysis N/a  Pertinent Imaging: N/a  Assessment & Plan:    1. Cancer of prostate with low recurrence risk (stage T1-2a and Gleason < 7 and PSA < 10) ([place]) Reviewed Promark study, as above which was somewhat reassuring Recommend continuation of active surveillance Will likely persue prostate MRI if PSA begins to rise or rectal exam changes  2. BPH with obstruction/lower urinary tract symptoms Continue flomax   Hollice Espy, MD  [facility] [address] [place], [place] [address] [phone]

## 2018-01-12 NOTE — Patient Instructions (Addendum)
Medication Instructions:   Consider starting zetia one a day for cholesterol  Labwork:  No new labs needed  Testing/Procedures:  No further testing at this time   Follow-Up: It was a pleasure seeing you in the office today. Please call us if you have new issues that need to be addressed before your next appt.  [phone]  Your physician wants you to follow-up in: 12 months.  You will receive a reminder letter in the mail two months in advance. If you don't receive a letter, please call our office to schedule the follow-up appointment.  If you need a refill on your cardiac medications before your next appointment, please call your pharmacy.  For educational health videos Log in to : [email or web address] Or : [email or web address], password : [email or web address]

## 2018-01-13 ENCOUNTER — Other Ambulatory Visit: Payer: Self-pay

## 2018-01-13 ENCOUNTER — Ambulatory Visit (AMBULATORY_SURGERY_CENTER): Payer: Medicare PPO | Admitting: Gastroenterology

## 2018-01-13 ENCOUNTER — Encounter: Payer: Self-pay | Admitting: Gastroenterology

## 2018-01-13 VITALS — BP 141/84 | HR 62 | Temp 98.4°F | Resp 9 | Ht 69.0 in | Wt 207.0 lb

## 2018-01-13 DIAGNOSIS — Z1211 Encounter for screening for malignant neoplasm of colon: Secondary | ICD-10-CM | POA: Diagnosis not present

## 2018-01-13 DIAGNOSIS — D125 Benign neoplasm of sigmoid colon: Secondary | ICD-10-CM | POA: Diagnosis not present

## 2018-01-13 DIAGNOSIS — K635 Polyp of colon: Secondary | ICD-10-CM | POA: Diagnosis not present

## 2018-01-13 MED ORDER — SODIUM CHLORIDE 0.9 % IV SOLN: 500.0000 mL | Freq: Once | INTRAVENOUS | Status: DC

## 2018-01-13 NOTE — Progress Notes (Signed)
Called to room to assist during endoscopic procedure.  Patient ID and intended procedure confirmed with present staff. Received instructions for my participation in the procedure from the performing physician.  

## 2018-01-13 NOTE — Patient Instructions (Signed)
YOU HAD AN ENDOSCOPIC PROCEDURE TODAY AT [facility]:   Refer to the procedure report that was given to you for any specific questions about what was found during the examination.  If the procedure report does not answer your questions, please call your gastroenterologist to clarify.  If you requested that your care partner not be given the details of your procedure findings, then the procedure report has been included in a sealed envelope for you to review at your convenience later.  YOU SHOULD EXPECT: Some feelings of bloating in the abdomen. Passage of more gas than usual.  Walking can help get rid of the air that was put into your GI tract during the procedure and reduce the bloating. If you had a lower endoscopy (such as a colonoscopy or flexible sigmoidoscopy) you may notice spotting of blood in your stool or on the toilet paper. If you underwent a bowel prep for your procedure, you may not have a normal bowel movement for a few days.  Please Note:  You might notice some irritation and congestion in your nose or some drainage.  This is from the oxygen used during your procedure.  There is no need for concern and it should clear up in a day or so.  SYMPTOMS TO REPORT IMMEDIATELY:   Following lower endoscopy (colonoscopy or flexible sigmoidoscopy):  Excessive amounts of blood in the stool  Significant tenderness or worsening of abdominal pains  Swelling of the abdomen that is new, acute  Fever of 100F or higher  For urgent or emergent issues, a gastroenterologist can be reached at any hour by calling [phone].  DIET:  We do recommend a small meal at first, but then you may proceed to your regular diet.  Drink plenty of fluids but you should avoid alcoholic beverages for 24 hours.  ACTIVITY:  You should plan to take it easy for the rest of today and you should NOT DRIVE or use heavy machinery until tomorrow (because of the sedation medicines used during the test).     FOLLOW UP: Our staff will call the number listed on your records the next business day following your procedure to check on you and address any questions or concerns that you may have regarding the information given to you following your procedure. If we do not reach you, we will leave a message.  However, if you are feeling well and you are not experiencing any problems, there is no need to return our call.  We will assume that you have returned to your regular daily activities without incident.  If any biopsies were taken you will be contacted by phone or by letter within the next 1-3 weeks.  Please call us at [phone] if you have not heard about the biopsies in 3 weeks.   SIGNATURES/CONFIDENTIALITY: You and/or your care partner have signed paperwork which will be entered into your electronic medical record.  These signatures attest to the fact that that the information above on your After Visit Summary has been reviewed and is understood.  Full responsibility of the confidentiality of this discharge information lies with you and/or your care-partner.  Please resume Plavix today  Continue your normal medications  Please read over handout about polyps

## 2018-01-13 NOTE — Progress Notes (Signed)
Pt's states no medical or surgical changes since previsit or office visit. 

## 2018-01-13 NOTE — Progress Notes (Signed)
A and O x3. Report to RN. Tolerated MAC anesthesia well.

## 2018-01-13 NOTE — Op Note (Signed)
[place] Patient Name: Charles Holder Procedure Date: 01/13/2018 2:45 PM MRN: [id number] Endoscopist: Mallie Mussel L. Loletha Carrow , MD Age: 52 Referring MD:  Date of Birth: 07-15-1966 Gender: Male Account #: [id number] Procedure:                Colonoscopy Indications:              Screening for colorectal malignant neoplasm, This                            is the patient's first screening colonoscopy Medicines:                Monitored Anesthesia Care Procedure:                Pre-Anesthesia Assessment:                           - Prior to the procedure, a History and Physical                            was performed, and patient medications and                            allergies were reviewed. The patient's tolerance of                            previous anesthesia was also reviewed. The risks                            and benefits of the procedure and the sedation                            options and risks were discussed with the patient.                            All questions were answered, and informed consent                            was obtained. Prior Anticoagulants: The patient                            last took aspirin 1 day and Plavix (clopidogrel) 5                            days prior to the procedure. ASA Grade Assessment:                            II - A patient with mild systemic disease. After                            reviewing the risks and benefits, the patient was                            deemed in satisfactory condition to undergo the  procedure.                           After obtaining informed consent, the colonoscope                            was passed under direct vision. Throughout the                            procedure, the patient's blood pressure, pulse, and                            oxygen saturations were monitored continuously. The                            Colonoscope was introduced through the anus and                             advanced to the the cecum, identified by                            appendiceal orifice and ileocecal valve. The                            colonoscopy was performed without difficulty. The                            patient tolerated the procedure well. The quality                            of the bowel preparation was good. The ileocecal                            valve, appendiceal orifice, and rectum were                            photographed. The quality of the bowel preparation                            was evaluated using the BBPS [facility] Bowel                            Preparation Scale) with scores of: Right Colon = 2,                            Transverse Colon = 2 and Left Colon = 2. The total                            BBPS score equals 6. Scope In: 2:51:26 PM Scope Out: 3:04:00 PM Scope Withdrawal Time: 0 hours 9 minutes 48 seconds  Total Procedure Duration: 0 hours 12 minutes 34 seconds  Findings:                 The perianal and digital rectal examinations were  normal.                           A 2 mm polyp was found in the distal sigmoid colon.                            The polyp was sessile (examined under WL and NBI).                            The polyp was removed with a cold biopsy forceps.                            Resection and retrieval were complete.                           The exam was otherwise without abnormality on                            direct and retroflexion views. Complications:            No immediate complications. Estimated Blood Loss:     Estimated blood loss was minimal. Impression:               - One 2 mm polyp in the distal sigmoid colon,                            removed with a cold biopsy forceps. Resected and                            retrieved.                           - The examination was otherwise normal on direct                            and retroflexion  views. Recommendation:           - Patient has a contact number available for                            emergencies. The signs and symptoms of potential                            delayed complications were discussed with the                            patient. Return to normal activities tomorrow.                            Written discharge instructions were provided to the                            patient.                           - Resume previous diet.                           -  Resume Plavix (clopidogrel) at prior dose today.                           - Await pathology results.                           - Repeat colonoscopy is recommended for                            surveillance. The colonoscopy date will be                            determined after pathology results from today's                            exam become available for review. Henry L. Loletha Carrow, MD 01/13/2018 3:10:47 PM This report has been signed electronically.

## 2018-01-14 ENCOUNTER — Telehealth: Payer: Self-pay

## 2018-01-14 NOTE — Telephone Encounter (Signed)
  Follow up Call-  Call back number 01/13/2018  Post procedure Call Back phone  # [phone] or [phone] or girlfriend  Permission to leave phone message Yes  Some recent data might be hidden     Patient questions:  Do you have a fever, pain , or abdominal swelling? No. Pain Score  0 *  Have you tolerated food without any problems? Yes.    Have you been able to return to your normal activities? Yes.    Do you have any questions about your discharge instructions: Diet   No. Medications  No. Follow up visit  No.  Do you have questions or concerns about your Care? No.  Crystal spoke on pt.'s behalf and stated pt. Is doing fine.  Actions: * If pain score is 4 or above: No action needed, pain <4.

## 2018-01-24 ENCOUNTER — Encounter: Payer: Self-pay | Admitting: Gastroenterology

## 2018-02-15 ENCOUNTER — Ambulatory Visit: Payer: Self-pay | Admitting: Cardiovascular Disease

## 2018-03-11 ENCOUNTER — Telehealth: Payer: Self-pay

## 2018-03-11 NOTE — Telephone Encounter (Signed)
Copied from [place] [id number]. Topic: Referral - Request >> Mar 11, 2018  3:16 PM Charles Holder wrote: Reason for CRM: pt called and asked for a referral for Dr. Trenton Gammon to get an MRI for his back, contact pt to advise

## 2018-03-11 NOTE — Telephone Encounter (Signed)
Has he seen Dr Trenton Gammon before? (has he had surgery with him?)- if not we would likely need to get the mri report first before dr Trenton Gammon would take a referral - am I right?  What are his current symptoms?  Will cc to [facility] and [place] both Thanks
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
## 2018-03-12 NOTE — Telephone Encounter (Signed)
Tried to call the pt multiple times, can not leave vm.

## 2018-03-12 NOTE — Telephone Encounter (Signed)
Patient doesn't need a referral he already has the appointment.

## 2018-03-12 NOTE — Telephone Encounter (Signed)
Ok- does he need a referral for visit with Dr Trenton Gammon ?  Thanks

## 2018-04-08 DIAGNOSIS — H04123 Dry eye syndrome of bilateral lacrimal glands: Secondary | ICD-10-CM | POA: Diagnosis not present

## 2018-04-13 ENCOUNTER — Ambulatory Visit: Payer: Medicare PPO | Admitting: Urology

## 2018-05-12 ENCOUNTER — Ambulatory Visit: Payer: Medicare PPO | Admitting: Urology

## 2018-08-09 DIAGNOSIS — H00021 Hordeolum internum right upper eyelid: Secondary | ICD-10-CM | POA: Diagnosis not present

## 2018-09-08 ENCOUNTER — Other Ambulatory Visit: Payer: Self-pay | Admitting: Cardiovascular Disease

## 2018-09-08 NOTE — Telephone Encounter (Signed)
Patient calling, currently at pharmacy   *STAT* If patient is at the pharmacy, call can be transferred to refill team.   1. Which medications need to be refilled? (please list name of each medication and dose if known)  Nitroglycerin (NITROSTAT)   2. Which pharmacy/location (including street and city if local pharmacy) is medication to be sent to? [facility]   3. Do they need a 30 day or 90 day supply?

## 2018-09-22 DIAGNOSIS — I10 Essential (primary) hypertension: Secondary | ICD-10-CM | POA: Diagnosis not present

## 2018-09-22 DIAGNOSIS — Z6829 Body mass index (BMI) 29.0-29.9, adult: Secondary | ICD-10-CM | POA: Diagnosis not present

## 2018-09-22 DIAGNOSIS — M5136 Other intervertebral disc degeneration, lumbar region: Secondary | ICD-10-CM | POA: Diagnosis not present

## 2018-10-07 ENCOUNTER — Other Ambulatory Visit: Payer: Self-pay | Admitting: Cardiovascular Disease

## 2018-10-08 NOTE — Telephone Encounter (Signed)
Nitro last filled 08/2018 I will contact pt to see if he has used his nitro.

## 2018-10-08 NOTE — Telephone Encounter (Signed)
Lmovm to discuss if pt is using Nitro. Last Refill of Nitro sent to local pharmacy 08/2018.

## 2018-10-15 DIAGNOSIS — H16011 Central corneal ulcer, right eye: Secondary | ICD-10-CM | POA: Diagnosis not present

## 2018-10-15 DIAGNOSIS — M5136 Other intervertebral disc degeneration, lumbar region: Secondary | ICD-10-CM | POA: Diagnosis not present

## 2018-10-17 DIAGNOSIS — H00021 Hordeolum internum right upper eyelid: Secondary | ICD-10-CM | POA: Diagnosis not present

## 2018-10-19 DIAGNOSIS — M5126 Other intervertebral disc displacement, lumbar region: Secondary | ICD-10-CM | POA: Diagnosis not present

## 2018-10-19 DIAGNOSIS — M48061 Spinal stenosis, lumbar region without neurogenic claudication: Secondary | ICD-10-CM | POA: Diagnosis not present

## 2018-10-19 DIAGNOSIS — M5136 Other intervertebral disc degeneration, lumbar region: Secondary | ICD-10-CM | POA: Diagnosis not present

## 2018-10-19 DIAGNOSIS — M47816 Spondylosis without myelopathy or radiculopathy, lumbar region: Secondary | ICD-10-CM | POA: Diagnosis not present

## 2018-11-03 DIAGNOSIS — M5136 Other intervertebral disc degeneration, lumbar region: Secondary | ICD-10-CM | POA: Diagnosis not present

## 2018-11-03 DIAGNOSIS — M5126 Other intervertebral disc displacement, lumbar region: Secondary | ICD-10-CM | POA: Diagnosis not present

## 2018-11-10 ENCOUNTER — Other Ambulatory Visit: Payer: Self-pay | Admitting: Cardiovascular Disease

## 2018-11-10 ENCOUNTER — Other Ambulatory Visit: Payer: Self-pay | Admitting: Urology

## 2018-11-10 DIAGNOSIS — H40033 Anatomical narrow angle, bilateral: Secondary | ICD-10-CM | POA: Diagnosis not present

## 2018-11-10 DIAGNOSIS — M79602 Pain in left arm: Secondary | ICD-10-CM

## 2018-11-10 DIAGNOSIS — H04123 Dry eye syndrome of bilateral lacrimal glands: Secondary | ICD-10-CM | POA: Diagnosis not present

## 2018-11-10 DIAGNOSIS — B009 Herpesviral infection, unspecified: Secondary | ICD-10-CM

## 2018-12-15 DIAGNOSIS — M47814 Spondylosis without myelopathy or radiculopathy, thoracic region: Secondary | ICD-10-CM | POA: Diagnosis not present

## 2018-12-31 ENCOUNTER — Telehealth: Payer: Self-pay | Admitting: Cardiovascular Disease

## 2018-12-31 NOTE — Telephone Encounter (Signed)
Voicemail message left for patient to call back for prescreening and review of appointment.

## 2018-12-31 NOTE — Telephone Encounter (Signed)
   Cardiac Questionnaire:    Since your last visit or hospitalization:    1. Have you been having new or worsening chest pain? No   2. Have you been having new or worsening shortness of breath? No 3. Have you been having new or worsening leg swelling, wt gain, or increase in abdominal girth (pants fitting more tightly)? No   4. Have you had any passing out spells? No   Spoke with patient and he is not reporting any symptoms at this time and would like to reschedule. Sending to pool and he verbalized understanding to call if any problems before we call back.

## 2019-01-04 ENCOUNTER — Ambulatory Visit: Payer: Self-pay | Admitting: Cardiovascular Disease

## 2019-01-12 ENCOUNTER — Ambulatory Visit: Payer: Medicare PPO | Admitting: Urology

## 2019-01-12 ENCOUNTER — Telehealth: Payer: Self-pay

## 2019-01-12 ENCOUNTER — Encounter: Payer: Self-pay | Admitting: Family Medicine

## 2019-01-12 ENCOUNTER — Ambulatory Visit (INDEPENDENT_AMBULATORY_CARE_PROVIDER_SITE_OTHER): Payer: Medicare PPO | Admitting: Family Medicine

## 2019-01-12 ENCOUNTER — Other Ambulatory Visit: Payer: Self-pay

## 2019-01-12 VITALS — BP 134/90 | HR 55 | Temp 97.8°F | Ht 69.5 in | Wt 192.1 lb

## 2019-01-12 DIAGNOSIS — K047 Periapical abscess without sinus: Secondary | ICD-10-CM | POA: Diagnosis not present

## 2019-01-12 DIAGNOSIS — C61 Malignant neoplasm of prostate: Secondary | ICD-10-CM | POA: Diagnosis not present

## 2019-01-12 DIAGNOSIS — F17200 Nicotine dependence, unspecified, uncomplicated: Secondary | ICD-10-CM

## 2019-01-12 MED ORDER — AMOXICILLIN-POT CLAVULANATE 875-125 MG PO TABS: 1.0000 | ORAL_TABLET | Freq: Two times a day (BID) | ORAL | 0 refills | Status: DC

## 2019-01-12 NOTE — Assessment & Plan Note (Signed)
Continues urology f/u

## 2019-01-12 NOTE — Telephone Encounter (Signed)
Received inbound call from patient reporting tooth pain and need for an extraction. States dentist is not seeing patients. Patient is requesting an antibiotic for possible infection.   Patient is unable to complete a Webex. Office visit scheduled 01/12/19 @ 1015.

## 2019-01-12 NOTE — Patient Instructions (Addendum)
Tepid salt water swishes when you can  Take the augmentin as directed   Ibuprofen -check with Dr Rockey Situ  (since you are on plavix)   Warm compress on jaw may help   If symptoms worsen or you develop fever or worse swelling (especially down neck)-alert [place] and get to the ER  Call the oral surgeon when you get home immediately   Think about quitting smoking

## 2019-01-12 NOTE — Telephone Encounter (Signed)
I will see him then

## 2019-01-12 NOTE — Assessment & Plan Note (Addendum)
Pt has broken /rotted teeth posterior lower jaw -evidence of decay is obvious (with sensitivity) as well as swelling and tenderness over inside lower gum area  Cover with augmentin  Pt plans to call oral surgeon immediately today to arrange consult/extraction  inst to call/update re: worse swelling or pain or fever -may need ED if necessary For pain-he takes ibuprofen (disc risks with plavix)  Is allergic to codeine  Disc use of compress /warm if tol Also salt water swishes  Update if not starting to improve in a week or if worsening   Smoking cessation is strongly encouraged

## 2019-01-12 NOTE — Assessment & Plan Note (Signed)
Disc in detail risks of smoking and possible outcomes including copd, vascular/ heart disease, cancer , respiratory and sinus infections  Pt voices understanding Also having dental issues / already has heart dz Strongly enc to quit

## 2019-01-12 NOTE — Progress Notes (Signed)
Subjective:    Patient ID: Charles Holder, male    DOB: 04/09/1966, 53 y.o.   MRN: [id number]  HPI Here for a dental problem  Thinks he has an abscess in posterior lower molar  Was told all his teeth will have to come out eventually / and get implants   Pain  Sensitivity to hot and cold  Feels a knot in gum with his tongue   He has taken ibuprofen  He is on plavix- ? If he should take that  Is allergic to codeine    Last dental visit was cleaning 2 mo ago  Was told there was nothing save to attach denture to   No fever  Perhaps a little swelling in R lower jaw area    Current smoker - less than 1ppd   Sees Dr Tamala Julian in [place] for dental care (not seeing patients currently) She has called back and trying to get an oral surgeon's name  Unsure when he will get in   Patient Active Problem List   Diagnosis Date Noted  . Dental abscess 01/12/2019  . Antiplatelet or antithrombotic long-term use 12/16/2017  . Special screening for malignant neoplasms, colon 12/16/2017  . Leukocytosis 08/20/2016  . Prostate cancer ([place]) 03/14/2016  . Herpes 03/14/2016  . Essential hypertension 08/31/2012  . CAD (coronary artery disease) 02/07/2011  . Stented coronary artery 02/07/2011  . Smoking 02/07/2011  . Mixed hyperlipidemia 02/07/2011  . Chronic back pain 06/05/2008   Past Medical History:  Diagnosis Date  . Adopted   . Anxiety   . Atrial fibrillation ([place])   . BPH (benign prostatic hyperplasia)   . Cardiomyopathy, ischemic 01/22/2011   EF 50%  . Chronic lower back pain    s/p L5-S1  . Coronary artery disease   . Depression   . ED (erectile dysfunction)   . Fatigue   . Headache   . Heart attack ([place])   . Herpes genitalia   . Hyperlipidemia   . Hypertension   . Hypogonadism in male   . Neuropathy   . Obesity   . Prostate cancer ([place])   . SOB (shortness of breath)    Past Surgical History:  Procedure Laterality Date  . back surgeries     x 4  . CORONARY  ANGIOPLASTY  01/22/2011   stent   Social History   Tobacco Use  . Smoking status: Current Every Day Smoker    Packs/day: 1.00    Types: Cigarettes  . Smokeless tobacco: Never Used  Substance Use Topics  . Alcohol use: No  . Drug use: Yes    Types: Marijuana   Family History  Adopted: Yes  Problem Relation Age of Onset  . Prostate cancer Neg Hx    Allergies  Allergen Reactions  . Codeine     Makes me sick    Current Outpatient Medications on File Prior to Visit  Medication Sig Dispense Refill  . amLODipine (NORVASC) 10 MG tablet TAKE ONE TABLET BY MOUTH EVERY DAY 90 tablet 3  . aspirin 81 MG EC tablet Take 81 mg by mouth daily.      . clopidogrel (PLAVIX) 75 MG tablet TAKE ONE TABLET BY MOUTH EVERY DAY 90 tablet 3  . ezetimibe (ZETIA) 10 MG tablet Take 1 tablet (10 mg total) by mouth daily. 30 tablet 11  . Multiple Vitamins-Minerals (MULTIVITAMIN ADULT PO) Take by mouth.    . nitroGLYCERIN (NITROSTAT) 0.4 MG SL tablet PLACE 1 TABLET UNDER TONGUE EVERY  5 MIN AS NEEDED FOR CHEST PAIN IF NO RELIEF IN15 MIN CALL 911 (MAX 3 TABS) 25 tablet 1  . Omega-3 Fatty Acids (FISH OIL) 1000 MG CAPS Take by mouth.    . Probiotic Product (PROBIOTIC ADVANCED PO) Take by mouth.    . valACYclovir (VALTREX) 1000 MG tablet TAKE 1 TABLET BY MOUTH ONCE DAILY 90 tablet 0   No current facility-administered medications on file prior to visit.     Review of Systems  Constitutional: Negative for activity change, appetite change, fatigue, fever and unexpected weight change.  HENT: Positive for dental problem. Negative for congestion, rhinorrhea, sore throat and trouble swallowing.   Eyes: Negative for pain, redness, itching and visual disturbance.  Respiratory: Negative for cough, chest tightness, shortness of breath and wheezing.   Cardiovascular: Negative for chest pain and palpitations.  Gastrointestinal: Negative for abdominal pain, blood in stool, constipation, diarrhea and nausea.  Endocrine:  Negative for cold intolerance, heat intolerance, polydipsia and polyuria.  Genitourinary: Negative for difficulty urinating, dysuria, frequency and urgency.  Musculoskeletal: Negative for arthralgias, joint swelling and myalgias.  Skin: Negative for pallor and rash.  Neurological: Negative for dizziness, tremors, weakness, numbness and headaches.  Hematological: Negative for adenopathy. Does not bruise/bleed easily.  Psychiatric/Behavioral: Negative for decreased concentration and dysphoric mood. The patient is not nervous/anxious.        Objective:   Physical Exam Constitutional:      General: He is not in acute distress.    Appearance: Normal appearance. He is normal weight. He is not ill-appearing or diaphoretic.  HENT:     Head: Normocephalic and atraumatic.     Comments: No obvious facial swelling     Nose: Nose normal.     Mouth/Throat:     Mouth: Mucous membranes are moist.     Pharynx: Oropharynx is clear.     Comments: Dental - (R lower jaw) 32 molar broken with decay 31 molar appears absent 30 molar is decayed severely and sensitive to tapping/pressure  Inside gum is mildly swollen and tender  No drainage or bleeding Eyes:     General: No scleral icterus.       Right eye: No discharge.        Left eye: No discharge.     Extraocular Movements: Extraocular movements intact.     Conjunctiva/sclera: Conjunctivae normal.     Pupils: Pupils are equal, round, and reactive to light.  Neck:     Musculoskeletal: Normal range of motion and neck supple. Muscular tenderness present. No neck rigidity.     Comments: Mild submandibular tenderness on R -no mass felt  Cardiovascular:     Rate and Rhythm: Bradycardia present.     Heart sounds: Normal heart sounds.  Pulmonary:     Effort: Pulmonary effort is normal. No respiratory distress.     Breath sounds: Normal breath sounds. Stridor present. No wheezing or rales.     Comments: Diffusely distant bs  Lymphadenopathy:      Cervical: No cervical adenopathy.  Skin:    General: Skin is warm and dry.     Coloration: Skin is not pale.     Findings: No erythema or rash.  Neurological:     Mental Status: He is alert.     Cranial Nerves: No cranial nerve deficit.     Coordination: Coordination normal.  Psychiatric:        Mood and Affect: Mood normal.     Comments: Good mood  Assessment & Plan:   Problem List Items Addressed This Visit      Digestive   Dental abscess - Primary    Pt has broken /rotted teeth posterior lower jaw -evidence of decay is obvious (with sensitivity) as well as swelling and tenderness over inside lower gum area  Cover with augmentin  Pt plans to call oral surgeon immediately today to arrange consult/extraction  inst to call/update re: worse swelling or pain or fever -may need ED if necessary For pain-he takes ibuprofen (disc risks with plavix)  Is allergic to codeine  Disc use of compress /warm if tol Also salt water swishes  Update if not starting to improve in a week or if worsening   Smoking cessation is strongly encouraged          Other   Smoking    Disc in detail risks of smoking and possible outcomes including copd, vascular/ heart disease, cancer , respiratory and sinus infections  Pt voices understanding Also having dental issues / already has heart dz Strongly enc to quit

## 2019-01-13 NOTE — Telephone Encounter (Signed)
Spoke with patient. Declines E-visit. Advised patient to call if he has any concerns.

## 2019-01-16 NOTE — Progress Notes (Deleted)
01/17/2019 9:01 PM   Charles Holder 23-Sep-1966 [id number]  Referring provider: Abner Greenspan, MD [place], [place] [address]  No chief complaint on file.   HPI: 53 year old male with a history of low risk cT1c Gleason 3+3 prostate cancer, iPSA 5.4 dx 11/17 s/p recent confirmatory prostate biopsy 09/2017 who returns today to for follow up.    Patient elected to pursue ProMark proteomic prognostic test on his most recent prostate biopsy specimen.  His pro [name] score was 41 which predicts a 24% chance of aggressive disease.  "Aggressive disease" is defined as unfavorable pathology in your prostate, P T3a are T3b disease, or nodal or distant metastases.  On the study, 24% risk is an 11% reduction of the risk predicted from biopsy alone.  Dx 08/2016 Low Risk Prostate Cancer T1c, PSA 5.4, prostate 46 grams Gleason 3+3=6, 4 cores, all left mid and left apex, 57-83 %; +PNI in left mid medial core   Repeat bx 09/24/17 T1c, PSA 3.6, TRUS vol 37.7 Gleason 3+3, 7/12 cores, primarily on left, 77-87% (negligible 1% in single isolated core right), positive perineural invasion  IPSS 9, mixed - nocturia, frequency  --> improved with Flomax   Mild baseline ED, carries NG    PMHx significant for CAD s/p PCI with stents on ASA/ plavix, cardiomyopathy, DM, HTN, HL, Afib.  He is scheduled for colonoscopy tomorrow.     PMH: Past Medical History:  Diagnosis Date  . Adopted   . Anxiety   . Atrial fibrillation ([place])   . BPH (benign prostatic hyperplasia)   . Cardiomyopathy, ischemic 01/22/2011   EF 50%  . Chronic lower back pain    s/p L5-S1  . Coronary artery disease   . Depression   . ED (erectile dysfunction)   . Fatigue   . Headache   . Heart attack ([place])   . Herpes genitalia   . Hyperlipidemia   . Hypertension   . Hypogonadism in male   . Neuropathy   . Obesity   . Prostate cancer ([place])   . SOB (shortness of breath)     Surgical History: Past  Surgical History:  Procedure Laterality Date  . back surgeries     x 4  . CORONARY ANGIOPLASTY  01/22/2011   stent    Home Medications:  Allergies as of 01/17/2019      Reactions   Codeine    Makes me sick       Medication List       Accurate as of January 16, 2019  9:01 PM. Always use your most recent med list.        amLODipine 10 MG tablet Commonly known as:  NORVASC TAKE ONE TABLET BY MOUTH EVERY DAY   amoxicillin-clavulanate 875-125 MG tablet Commonly known as:  AUGMENTIN Take 1 tablet by mouth 2 (two) times daily.   aspirin 81 MG EC tablet Take 81 mg by mouth daily.   clopidogrel 75 MG tablet Commonly known as:  PLAVIX TAKE ONE TABLET BY MOUTH EVERY DAY   ezetimibe 10 MG tablet Commonly known as:  ZETIA Take 1 tablet (10 mg total) by mouth daily.   Fish Oil 1000 MG Caps Take by mouth.   MULTIVITAMIN ADULT PO Take by mouth.   nitroGLYCERIN 0.4 MG SL tablet Commonly known as:  NITROSTAT PLACE 1 TABLET UNDER TONGUE EVERY 5 MIN AS NEEDED FOR CHEST PAIN IF NO RELIEF IN15 MIN CALL 911 (MAX 3 TABS)   PROBIOTIC  ADVANCED PO Take by mouth.   valACYclovir 1000 MG tablet Commonly known as:  VALTREX TAKE 1 TABLET BY MOUTH ONCE DAILY       Allergies:  Allergies  Allergen Reactions  . Codeine     Makes me sick     Family History: Family History  Adopted: Yes  Problem Relation Age of Onset  . Prostate cancer Neg Hx     Social History:  reports that he has been smoking cigarettes. He has been smoking about 1.00 pack per day. He has never used smokeless tobacco. He reports current drug use. Drug: Marijuana. He reports that he does not drink alcohol.  ROS:                                        Physical Exam: There were no vitals taken for this visit.  Constitutional:  Well nourished. Alert and oriented, No acute distress. HEENT: [place] AT, moist mucus membranes.  Trachea midline, no masses. Cardiovascular: No clubbing, cyanosis, or  edema. Respiratory: Normal respiratory effort, no increased work of breathing. GI: Abdomen is soft, non tender, non distended, no abdominal masses. Liver and spleen not palpable.  No hernias appreciated.  Stool sample for occult testing is not indicated.   GU: No CVA tenderness.  No bladder fullness or masses.  Patient with circumcised/uncircumcised phallus. ***Foreskin easily retracted***  Urethral meatus is patent.  No penile discharge. No penile lesions or rashes. Scrotum without lesions, cysts, rashes and/or edema.  Testicles are located scrotally bilaterally. No masses are appreciated in the testicles. Left and right epididymis are normal. Rectal: Patient with  normal sphincter tone. Anus and perineum without scarring or rashes. No rectal masses are appreciated. Prostate is approximately *** grams, *** nodules are appreciated. Seminal vesicles are normal. Skin: No rashes, bruises or suspicious lesions. Lymph: No cervical or inguinal adenopathy. Neurologic: Grossly intact, no focal deficits, moving all 4 extremities. Psychiatric: Normal mood and affect.  Laboratory Data: Lab Results  Component Value Date   WBC 14.3 (H) 12/09/2017   HGB 17.0 12/09/2017   HCT 49.7 12/09/2017   MCV 89.6 12/09/2017   PLT 313.0 12/09/2017    Lab Results  Component Value Date   CREATININE 1.22 12/09/2017    Lab Results  Component Value Date   HGBA1C  01/23/2011    5.5 (NOTE)                                                                       According to the ADA Clinical Practice Recommendations for 2011, when HbA1c is used as a screening test:   >=6.5%   Diagnostic of Diabetes Mellitus           (if abnormal result  is confirmed)  5.7-6.4%   Increased risk of developing Diabetes Mellitus  References:Diagnosis and Classification of Diabetes Mellitus,Diabetes IRCV,8938,10(FBPZW 1):S62-S69 and Standards of Medical Care in         Diabetes - 2011,Diabetes Care,2011,34  (Suppl 1):S11-S61.    Component      Latest Ref Rng & Units 03/12/2016 04/11/2016 08/07/2016 08/07/2016           2:04 PM  2:04 PM  Prostate Specific Ag, Serum     0.0 - 4.0 ng/mL 5.2 (H) 3.4 5.4 (H) 5.4 (H)   Component     Latest Ref Rng & Units 12/18/2016 04/24/2017 09/24/2017            Prostate Specific Ag, Serum     0.0 - 4.0 ng/mL 3.5 2.7 3.6   Promark study   Urinalysis N/a  Pertinent Imaging: N/a  Assessment & Plan:    1. Cancer of prostate with low recurrence risk (stage T1-2a and Gleason < 7 and PSA < 10) ([place]) Reviewed Promark study, as above which was somewhat reassuring Recommend continuation of active surveillance PSA drawn today  Will likely persue prostate MRI if PSA begins to rise or rectal exam changes  2. BPH with obstruction/lower urinary tract symptoms IPSS score is ***, it is stable/improving/worsening Continue conservative management, avoiding bladder irritants and timed voiding's Most bothersome symptoms is/are *** Initiate alpha-blocker (***), discussed side effects *** Initiate 5 alpha reductase inhibitor (***), discussed side effects *** Continue tamsulosin 0.4 mg daily, alfuzosin 10 mg daily, Rapaflo 8 mg daily, terazosin, doxazosin, Cialis 5 mg daily and finasteride 5 mg daily, dutasteride 0.5 mg daily***:refills given Cannot tolerate medication or medication failure, schedule cystoscopy *** RTC in *** months for IPSS, PSA, PVR and exam     Continue flomax   Zara Council, [facility] [address] [place], [place] [address] [phone]

## 2019-01-17 ENCOUNTER — Telehealth: Payer: Self-pay | Admitting: Urology

## 2019-01-17 ENCOUNTER — Ambulatory Visit: Payer: Medicare PPO | Admitting: Urology

## 2019-01-17 NOTE — Telephone Encounter (Signed)
Please call [name] and reschedule his missed appointment from today.

## 2019-01-20 ENCOUNTER — Encounter: Payer: Self-pay | Admitting: Family Medicine

## 2019-01-20 ENCOUNTER — Other Ambulatory Visit: Payer: Self-pay | Admitting: Cardiovascular Disease

## 2019-01-20 MED ORDER — AMOXICILLIN-POT CLAVULANATE 875-125 MG PO TABS: 1.0000 | ORAL_TABLET | Freq: Two times a day (BID) | ORAL | 0 refills | Status: DC

## 2019-01-20 NOTE — Addendum Note (Signed)
Addended by: Loura Pardon A on: 01/20/2019 10:39 AM   Modules accepted: Orders

## 2019-01-20 NOTE — Telephone Encounter (Signed)
Recall placed for August

## 2019-02-28 ENCOUNTER — Other Ambulatory Visit: Payer: Self-pay | Admitting: Cardiovascular Disease

## 2019-03-01 ENCOUNTER — Telehealth: Payer: Self-pay

## 2019-03-01 NOTE — Telephone Encounter (Signed)
Called Patient.  No answer. LMOV.  Would like to offer an in office appointment with Christell Faith, PA

## 2019-03-14 ENCOUNTER — Ambulatory Visit: Payer: Medicare PPO | Admitting: Urology

## 2019-03-24 NOTE — Progress Notes (Deleted)
03/25/2019 8:35 PM   Charles Holder 03-27-1966 [id number]  Referring provider: Abner Greenspan, MD [place],  [place] [address]  No chief complaint on file.   HPI: 53 year old male with a history of low risk cT1c Gleason 3+3 prostate cancer, iPSA 5.4 dx 11/17 s/p recent confirmatory prostate biopsy 09/2017 who returns today to discuss his Promark genetic analysis.  Patient elected to pursue ProMark proteomic prognostic test on his most recent prostate biopsy specimen.  His pro [name] score was 41 which predicts a 24% chance of aggressive disease.  "Aggressive disease" is defined as unfavorable pathology in your prostate, P T3a are T3b disease, or nodal or distant metastases.  On the study, 24% risk is an 11% reduction of the risk predicted from biopsy alone.  Dx 08/2016 Low Risk Prostate Cancer T1c, PSA 5.4, prostate 46 grams Gleason 3+3=6, 4 cores, all left mid and left apex, 57-83 %; +PNI in left mid medial core   Repeat bx 09/24/17 T1c, PSA 3.6, TRUS vol 37.7 Gleason 3+3, 7/12 cores, primarily on left, 77-87% (negligible 1% in single isolated corn right), positive perineural invasion  IPSS 9, mixed - nocturia, frequency  --> improved with Flomax   Mild baseline ED, carries NG    PMHx significant for CAD s/p PCI with stents on ASA/ plavix, cardiomyopathy, DM, HTN, HL, Afib.  He is scheduled for colonoscopy tomorrow.     PMH: Past Medical History:  Diagnosis Date  . Adopted   . Anxiety   . Atrial fibrillation ([place])   . BPH (benign prostatic hyperplasia)   . Cardiomyopathy, ischemic 01/22/2011   EF 50%  . Chronic lower back pain    s/p L5-S1  . Coronary artery disease   . Depression   . ED (erectile dysfunction)   . Fatigue   . Headache   . Heart attack ([place])   . Herpes genitalia   . Hyperlipidemia   . Hypertension   . Hypogonadism in male   . Neuropathy   . Obesity   . Prostate cancer ([place])   . SOB (shortness of breath)      Surgical History: Past Surgical History:  Procedure Laterality Date  . back surgeries     x 4  . CORONARY ANGIOPLASTY  01/22/2011   stent    Home Medications:  Allergies as of 03/25/2019      Reactions   Codeine    Makes me sick       Medication List       Accurate as of March 24, 2019  8:35 PM. If you have any questions, ask your nurse or doctor.        amLODipine 10 MG tablet Commonly known as: NORVASC TAKE ONE TABLET BY MOUTH EVERY DAY   amoxicillin-clavulanate 875-125 MG tablet Commonly known as: AUGMENTIN Take 1 tablet by mouth 2 (two) times daily.   aspirin 81 MG EC tablet Take 81 mg by mouth daily.   clopidogrel 75 MG tablet Commonly known as: PLAVIX TAKE ONE TABLET BY MOUTH EVERY DAY   ezetimibe 10 MG tablet Commonly known as: ZETIA TAKE ONE TABLET BY MOUTH EVERY DAY **NEED APPOINTMENT FOR FURTHER REFILLS**   Fish Oil 1000 MG Caps Take by mouth.   MULTIVITAMIN ADULT PO Take by mouth.   nitroGLYCERIN 0.4 MG SL tablet Commonly known as: NITROSTAT PLACE 1 TABLET UNDER TONGUE EVERY 5 MIN AS NEEDED FOR CHEST PAIN IF NO RELIEF IN15 MIN CALL 911 (MAX 3 TABS)  PROBIOTIC ADVANCED PO Take by mouth.   valACYclovir 1000 MG tablet Commonly known as: VALTREX TAKE 1 TABLET BY MOUTH ONCE DAILY       Allergies:  Allergies  Allergen Reactions  . Codeine     Makes me sick     Family History: Family History  Adopted: Yes  Problem Relation Age of Onset  . Prostate cancer Neg Hx     Social History:  reports that he has been smoking cigarettes. He has been smoking about 1.00 pack per day. He has never used smokeless tobacco. He reports current drug use. Drug: Marijuana. He reports that he does not drink alcohol.  ROS:                                        Physical Exam: There were no vitals taken for this visit.  Constitutional:  Well nourished. Alert and oriented, No acute distress. HEENT: [place] AT, moist mucus membranes.   Trachea midline, no masses. Cardiovascular: No clubbing, cyanosis, or edema. Respiratory: Normal respiratory effort, no increased work of breathing. GI: Abdomen is soft, non tender, non distended, no abdominal masses. Liver and spleen not palpable.  No hernias appreciated.  Stool sample for occult testing is not indicated.   GU: No CVA tenderness.  No bladder fullness or masses.  Patient with circumcised/uncircumcised phallus. ***Foreskin easily retracted***  Urethral meatus is patent.  No penile discharge. No penile lesions or rashes. Scrotum without lesions, cysts, rashes and/or edema.  Testicles are located scrotally bilaterally. No masses are appreciated in the testicles. Left and right epididymis are normal. Rectal: Patient with  normal sphincter tone. Anus and perineum without scarring or rashes. No rectal masses are appreciated. Prostate is approximately *** grams, *** nodules are appreciated. Seminal vesicles are normal. Skin: No rashes, bruises or suspicious lesions. Lymph: No cervical or inguinal adenopathy. Neurologic: Grossly intact, no focal deficits, moving all 4 extremities. Psychiatric: Normal mood and affect.  Laboratory Data: Lab Results  Component Value Date   WBC 14.3 (H) 12/09/2017   HGB 17.0 12/09/2017   HCT 49.7 12/09/2017   MCV 89.6 12/09/2017   PLT 313.0 12/09/2017    Lab Results  Component Value Date   CREATININE 1.22 12/09/2017    Lab Results  Component Value Date   HGBA1C  01/23/2011    5.5 (NOTE)                                                                       According to the ADA Clinical Practice Recommendations for 2011, when HbA1c is used as a screening test:   >=6.5%   Diagnostic of Diabetes Mellitus           (if abnormal result  is confirmed)  5.7-6.4%   Increased risk of developing Diabetes Mellitus  References:Diagnosis and Classification of Diabetes Mellitus,Diabetes CZYS,0630,16(WFUXN 1):S62-S69 and Standards of Medical Care in          Diabetes - 2011,Diabetes Care,2011,34  (Suppl 1):S11-S61.    Component     Latest Ref Rng & Units 03/12/2016 04/11/2016 08/07/2016 08/07/2016           2:04 PM  2:04 PM  Prostate Specific Ag, Serum     0.0 - 4.0 ng/mL 5.2 (H) 3.4 5.4 (H) 5.4 (H)   Component     Latest Ref Rng & Units 12/18/2016 04/24/2017 09/24/2017            Prostate Specific Ag, Serum     0.0 - 4.0 ng/mL 3.5 2.7 3.6   Promark study   Urinalysis N/a  Pertinent Imaging: N/a  Assessment & Plan:    1. Cancer of prostate with low recurrence risk (stage T1-2a and Gleason < 7 and PSA < 10) ([place]) Reviewed Promark study, as above which was somewhat reassuring Recommend continuation of active surveillance Will likely persue prostate MRI if PSA begins to rise or rectal exam changes  2. BPH with obstruction/lower urinary tract symptoms Continue flomax   Zara Council, PA-C  [facility] [address] [place],  [address] [phone]

## 2019-03-25 ENCOUNTER — Ambulatory Visit: Payer: Medicare PPO | Admitting: Urology

## 2019-03-25 ENCOUNTER — Encounter: Payer: Self-pay | Admitting: Urology

## 2019-03-28 ENCOUNTER — Other Ambulatory Visit: Payer: Self-pay | Admitting: Cardiovascular Disease

## 2019-03-28 NOTE — Telephone Encounter (Signed)
*  STAT* If patient is at the pharmacy, call can be transferred to refill team.   1. Which medications need to be refilled? (please list name of each medication and dose if known) Zetia  2. Which pharmacy/location (including street and city if local pharmacy) is medication to be sent to? [facility]  3. Do they need a 30 day or 90 day supply? 90 

## 2019-04-19 NOTE — Progress Notes (Signed)
04/20/2019 11:56 AM   Charles Holder [birth year] [id number]  Referring provider: Abner Greenspan, MD [address] [place],  [place] [address]  Chief Complaint  Patient presents with  . Benign Prostatic Hypertrophy    HPI: 53 year old male with a history of low risk cT1c Gleason 3+3 prostate cancer, iPSA 5.4 dx 11/17 s/p recent confirmatory prostate biopsy 09/2017 who returns today for follow up.    Patient elected to pursue ProMark proteomic prognostic test on his most recent prostate biopsy specimen.  His pro [name] score was 41 which predicts a 24% chance of aggressive disease.  "Aggressive disease" is defined as unfavorable pathology in your prostate, P T3a are T3b disease, or nodal or distant metastases.  On the study, 24% risk is an 11% reduction of the risk predicted from biopsy alone.  Dx 08/2016 Low Risk Prostate Cancer T1c, PSA 5.4, prostate 46 grams Gleason 3+3=6, 4 cores, all left mid and left apex, 57-83 %; +PNI in left mid medial core   Repeat bx 09/24/17 T1c, PSA 3.6, TRUS vol 37.7 Gleason 3+3, 7/12 cores, primarily on left, 77-87% (negligible 1% in single isolated corn right), positive perineural invasion  I PSS 13/4    Previous IPSS 9, mixed - nocturia  -> not bothersome to him at this time Patient denies any gross hematuria, dysuria or suprapubic/flank pain.  Patient denies any fevers, chills, nausea or vomiting.   He is having intermittent flairs of herpes outbreaks.    Mild baseline ED, carries NG    PMHx significant for CAD s/p PCI with stents on ASA/ plavix, cardiomyopathy, DM, HTN, HL, Afib.  He is scheduled for colonoscopy tomorrow.     PMH: Past Medical History:  Diagnosis Date  . Adopted   . Anxiety   . Atrial fibrillation ([place])   . BPH (benign prostatic hyperplasia)   . Cardiomyopathy, ischemic 01/22/2011   EF 50%  . Chronic lower back pain    s/p L5-S1  . Coronary artery disease   . Depression   . ED (erectile dysfunction)    . Fatigue   . Headache   . Heart attack ([place])   . Herpes genitalia   . Hyperlipidemia   . Hypertension   . Hypogonadism in male   . Neuropathy   . Obesity   . Prostate cancer ([place])   . SOB (shortness of breath)     Surgical History: Past Surgical History:  Procedure Laterality Date  . back surgeries     x 4  . CORONARY ANGIOPLASTY  01/22/2011   stent    Home Medications:  Allergies as of 04/20/2019      Reactions   Codeine    Makes me sick       Medication List       Accurate as of April 20, 2019 11:56 AM. If you have any questions, ask your nurse or doctor.        amLODipine 10 MG tablet Commonly known as: NORVASC TAKE ONE TABLET BY MOUTH EVERY DAY   amoxicillin-clavulanate 875-125 MG tablet Commonly known as: AUGMENTIN Take 1 tablet by mouth 2 (two) times daily.   aspirin 81 MG EC tablet Take 81 mg by mouth daily.   clopidogrel 75 MG tablet Commonly known as: PLAVIX TAKE ONE TABLET BY MOUTH EVERY DAY   ezetimibe 10 MG tablet Commonly known as: ZETIA TAKE ONE TABLET EVERYDAY. NEED APPOINTMENT FOR FURTHER REFILLS   Fish Oil 1000 MG Caps Take by mouth.  MULTIVITAMIN ADULT PO Take by mouth.   nitroGLYCERIN 0.4 MG SL tablet Commonly known as: NITROSTAT PLACE 1 TABLET UNDER TONGUE EVERY 5 MIN AS NEEDED FOR CHEST PAIN IF NO RELIEF IN15 MIN CALL 911 (MAX 3 TABS)   PROBIOTIC ADVANCED PO Take by mouth.   valACYclovir 1000 MG tablet Commonly known as: VALTREX Take 1 tablet (1,000 mg total) by mouth daily.       Allergies:  Allergies  Allergen Reactions  . Codeine     Makes me sick     Family History: Family History  Adopted: Yes  Problem Relation Age of Onset  . Prostate cancer Neg Hx     Social History:  reports that he has been smoking cigarettes. He has been smoking about 1.00 pack per day. He has never used smokeless tobacco. He reports current drug use. Drug: Marijuana. He reports that he does not drink alcohol.  ROS: UROLOGY  Frequent Urination?: No Hard to postpone urination?: No Burning/pain with urination?: No Get up at night to urinate?: Yes Leakage of urine?: No Urine stream starts and stops?: No Trouble starting stream?: No Do you have to strain to urinate?: No Blood in urine?: No Urinary tract infection?: No Sexually transmitted disease?: No Injury to kidneys or bladder?: No Painful intercourse?: No Weak stream?: No Erection problems?: No Penile pain?: No  Gastrointestinal Nausea?: No Vomiting?: No Indigestion/heartburn?: No Diarrhea?: No Constipation?: No  Constitutional Fever: No Night sweats?: No Weight loss?: Yes Fatigue?: No  Skin Skin rash/lesions?: No Itching?: No  Eyes Blurred vision?: No Double vision?: No  Ears/Nose/Throat Sore throat?: No Sinus problems?: No  Hematologic/Lymphatic Swollen glands?: No Easy bruising?: Yes  Cardiovascular Leg swelling?: No Chest pain?: Yes  Respiratory Cough?: No Shortness of breath?: No  Endocrine Excessive thirst?: No  Musculoskeletal Back pain?: Yes Joint pain?: Yes  Neurological Headaches?: No Dizziness?: No  Psychologic Depression?: No Anxiety?: No  Physical Exam: BP (!) 138/92 (BP Location: Left Arm, Patient Position: Sitting, Cuff Size: Normal)   Pulse 66   Ht 5' 9.5" (1.765 m)   Wt 188 lb 4.8 oz (85.4 kg)   BMI 27.41 kg/m   Constitutional:  Well nourished. Alert and oriented, No acute distress. HEENT: [place] AT, moist mucus membranes.  Trachea midline, no masses. Cardiovascular: No clubbing, cyanosis, or edema. Respiratory: Normal respiratory effort, no increased work of breathing. GI: Abdomen is soft, non tender, non distended, no abdominal masses. Liver and spleen not palpable.  Umbilical  hernia noted.  Stool sample for occult testing is not indicated.   GU: No CVA tenderness.  No bladder fullness or masses.  Patient with uncircumcised phallus.  Foreskin easily retracted.  Urethral meatus is patent.   No penile discharge. No penile lesions or rashes. Scrotum without lesions, cysts, rashes and/or edema.  Testicles are located scrotally bilaterally. No masses are appreciated in the testicles. Left and right epididymis are normal. Rectal: Patient with  normal sphincter tone. Anus and perineum without scarring or rashes. No rectal masses are appreciated. Prostate is approximately 50 grams, could only palpate apex and midportion of the gland, no nodules are appreciated.  Skin: No rashes, bruises or suspicious lesions. Lymph: No inguinal adenopathy. Neurologic: Grossly intact, no focal deficits, moving all 4 extremities. Psychiatric: Normal mood and affect.  Laboratory Data: Lab Results  Component Value Date   WBC 14.3 (H) 12/09/2017   HGB 17.0 12/09/2017   HCT 49.7 12/09/2017   MCV 89.6 12/09/2017   PLT 313.0 12/09/2017  Lab Results  Component Value Date   CREATININE 1.22 12/09/2017    Lab Results  Component Value Date   HGBA1C  01/23/2011    5.5 (NOTE)                                                                       According to the ADA Clinical Practice Recommendations for 2011, when HbA1c is used as a screening test:   >=6.5%   Diagnostic of Diabetes Mellitus           (if abnormal result  is confirmed)  5.7-6.4%   Increased risk of developing Diabetes Mellitus  References:Diagnosis and Classification of Diabetes Mellitus,Diabetes CBJS,2831,51(VOHYW 1):S62-S69 and Standards of Medical Care in         Diabetes - 2011,Diabetes Care,2011,34  (Suppl 1):S11-S61.    Component     Latest Ref Rng & Units 03/12/2016 04/11/2016 08/07/2016 08/07/2016           2:04 PM  2:04 PM  Prostate Specific Ag, Serum     0.0 - 4.0 ng/mL 5.2 (H) 3.4 5.4 (H) 5.4 (H)   Component     Latest Ref Rng & Units 12/18/2016 04/24/2017 09/24/2017            Prostate Specific Ag, Serum     0.0 - 4.0 ng/mL 3.5 2.7 3.6   Promark study   Urinalysis N/a  Pertinent Imaging: N/a  Assessment &  Plan:    1. Cancer of prostate with low recurrence risk (stage T1-2a and Gleason < 7 and PSA < 10) ([place]) Promark study was somewhat reassuring Recommend continuation of active surveillance -PSA drawn today - prostate exam unchanged Will likely persue prostate MRI if PSA begins to rise or rectal exam changes  2. BPH with obstruction/lower urinary tract symptoms Continue conservative management  3. Herpes simplex Refill of Valtrex given    Zara Council, PA-C  [facility] [address] [place], [place] [address] [phone]
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
## 2019-04-20 ENCOUNTER — Ambulatory Visit (INDEPENDENT_AMBULATORY_CARE_PROVIDER_SITE_OTHER): Payer: Medicare PPO | Admitting: Urology

## 2019-04-20 ENCOUNTER — Ambulatory Visit (INDEPENDENT_AMBULATORY_CARE_PROVIDER_SITE_OTHER): Payer: Medicare PPO | Admitting: Cardiovascular Disease

## 2019-04-20 ENCOUNTER — Encounter: Payer: Self-pay | Admitting: Urology

## 2019-04-20 ENCOUNTER — Other Ambulatory Visit: Payer: Self-pay

## 2019-04-20 ENCOUNTER — Encounter: Payer: Self-pay | Admitting: Cardiovascular Disease

## 2019-04-20 VITALS — BP 138/92 | HR 66 | Ht 69.5 in | Wt 188.3 lb

## 2019-04-20 VITALS — BP 144/70 | HR 60 | Ht 70.0 in | Wt 188.0 lb

## 2019-04-20 DIAGNOSIS — C61 Malignant neoplasm of prostate: Secondary | ICD-10-CM

## 2019-04-20 DIAGNOSIS — I10 Essential (primary) hypertension: Secondary | ICD-10-CM

## 2019-04-20 DIAGNOSIS — E7800 Pure hypercholesterolemia, unspecified: Secondary | ICD-10-CM | POA: Diagnosis not present

## 2019-04-20 DIAGNOSIS — B009 Herpesviral infection, unspecified: Secondary | ICD-10-CM | POA: Diagnosis not present

## 2019-04-20 DIAGNOSIS — I25118 Atherosclerotic heart disease of native coronary artery with other forms of angina pectoris: Secondary | ICD-10-CM | POA: Diagnosis not present

## 2019-04-20 DIAGNOSIS — N138 Other obstructive and reflux uropathy: Secondary | ICD-10-CM | POA: Diagnosis not present

## 2019-04-20 DIAGNOSIS — M79602 Pain in left arm: Secondary | ICD-10-CM | POA: Diagnosis not present

## 2019-04-20 DIAGNOSIS — N401 Enlarged prostate with lower urinary tract symptoms: Secondary | ICD-10-CM

## 2019-04-20 DIAGNOSIS — F17200 Nicotine dependence, unspecified, uncomplicated: Secondary | ICD-10-CM

## 2019-04-20 MED ORDER — AMLODIPINE BESYLATE 10 MG PO TABS: 10.0000 mg | ORAL_TABLET | Freq: Every day | ORAL | 3 refills | Status: DC

## 2019-04-20 MED ORDER — CLOPIDOGREL BISULFATE 75 MG PO TABS: 75.0000 mg | ORAL_TABLET | Freq: Every day | ORAL | 3 refills | Status: DC

## 2019-04-20 MED ORDER — VALACYCLOVIR HCL 1 G PO TABS: 1000.0000 mg | ORAL_TABLET | Freq: Every day | ORAL | 0 refills | Status: DC

## 2019-04-20 MED ORDER — EZETIMIBE 10 MG PO TABS: 10.0000 mg | ORAL_TABLET | Freq: Every day | ORAL | 3 refills | Status: DC

## 2019-04-20 NOTE — Patient Instructions (Addendum)
Medication Instructions:  Refills sent in. STAY on Aspirin 81 mg  If you need a refill on your cardiac medications before your next appointment, please call your pharmacy.    Lab work: Lipids, direct LDL today    If you have labs (blood work) drawn today and your tests are completely normal, you will receive your results only by: Marland Kitchen MyChart Message (if you have MyChart) OR . A paper copy in the mail If you have any lab test that is abnormal or we need to change your treatment, we will call you to review the results.   Testing/Procedures: No new testing needed   Follow-Up: At [facility], you and your health needs are our priority.  As part of our continuing mission to provide you with exceptional heart care, we have created designated Provider Care Teams.  These Care Teams include your primary Cardiologist (physician) and Advanced Practice Providers (APPs -  Physician Assistants and Nurse Practitioners) who all work together to provide you with the care you need, when you need it.  . You will need a follow up appointment in 12 months .   Please call our office 2 months in advance to schedule this appointment.    . Providers on your designated Care Team:   . Murray Hodgkins, NP . Christell Faith, PA-C . Marrianne Mood, PA-C  Any Other Special Instructions Will Be Listed Below (If Applicable).  For educational health videos Log in to : [email or web address] Or : [email or web address], password : [email or web address]

## 2019-04-21 LAB — LIPID PANEL
Chol/HDL Ratio: 5.3 ratio — ABNORMAL HIGH (ref 0.0–5.0)
Cholesterol, Total: 149 mg/dL (ref 100–199)
HDL: 28 mg/dL — ABNORMAL LOW (ref >39)
LDL Calculated: 88 mg/dL (ref 0–99)
Triglycerides: 167 mg/dL — ABNORMAL HIGH (ref 0–149)
VLDL Cholesterol Cal: 33 mg/dL (ref 5–40)

## 2019-04-21 LAB — LDL CHOLESTEROL, DIRECT: LDL Direct: 98 mg/dL (ref 0–99)

## 2019-04-21 LAB — PSA: Prostate Specific Ag, Serum: 2.8 ng/mL (ref 0.0–4.0)

## 2019-06-22 ENCOUNTER — Other Ambulatory Visit: Payer: Self-pay | Admitting: Cardiovascular Disease

## 2019-07-13 ENCOUNTER — Encounter: Payer: Self-pay | Admitting: Family Medicine

## 2019-07-18 ENCOUNTER — Other Ambulatory Visit: Payer: Self-pay | Admitting: Urology

## 2019-07-18 DIAGNOSIS — B009 Herpesviral infection, unspecified: Secondary | ICD-10-CM

## 2019-08-31 DIAGNOSIS — H2513 Age-related nuclear cataract, bilateral: Secondary | ICD-10-CM | POA: Diagnosis not present

## 2019-08-31 DIAGNOSIS — H18821 Corneal disorder due to contact lens, right eye: Secondary | ICD-10-CM | POA: Diagnosis not present

## 2019-08-31 DIAGNOSIS — H52223 Regular astigmatism, bilateral: Secondary | ICD-10-CM | POA: Diagnosis not present

## 2019-08-31 DIAGNOSIS — H16011 Central corneal ulcer, right eye: Secondary | ICD-10-CM | POA: Diagnosis not present

## 2019-08-31 DIAGNOSIS — H5213 Myopia, bilateral: Secondary | ICD-10-CM | POA: Diagnosis not present

## 2019-08-31 DIAGNOSIS — H524 Presbyopia: Secondary | ICD-10-CM | POA: Diagnosis not present

## 2019-08-31 DIAGNOSIS — H17811 Minor opacity of cornea, right eye: Secondary | ICD-10-CM | POA: Diagnosis not present

## 2019-09-02 DIAGNOSIS — D4981 Neoplasm of unspecified behavior of retina and choroid: Secondary | ICD-10-CM | POA: Diagnosis not present

## 2019-09-02 DIAGNOSIS — H16011 Central corneal ulcer, right eye: Secondary | ICD-10-CM | POA: Diagnosis not present

## 2019-09-02 DIAGNOSIS — H17811 Minor opacity of cornea, right eye: Secondary | ICD-10-CM | POA: Diagnosis not present

## 2019-09-02 DIAGNOSIS — H2513 Age-related nuclear cataract, bilateral: Secondary | ICD-10-CM | POA: Diagnosis not present

## 2019-09-02 DIAGNOSIS — H35033 Hypertensive retinopathy, bilateral: Secondary | ICD-10-CM | POA: Diagnosis not present

## 2019-09-02 DIAGNOSIS — H18821 Corneal disorder due to contact lens, right eye: Secondary | ICD-10-CM | POA: Diagnosis not present

## 2019-09-07 DIAGNOSIS — H18821 Corneal disorder due to contact lens, right eye: Secondary | ICD-10-CM | POA: Diagnosis not present

## 2019-09-07 DIAGNOSIS — H17811 Minor opacity of cornea, right eye: Secondary | ICD-10-CM | POA: Diagnosis not present

## 2019-09-07 DIAGNOSIS — H16011 Central corneal ulcer, right eye: Secondary | ICD-10-CM | POA: Diagnosis not present

## 2019-09-07 DIAGNOSIS — H2513 Age-related nuclear cataract, bilateral: Secondary | ICD-10-CM | POA: Diagnosis not present

## 2020-02-07 ENCOUNTER — Telehealth: Payer: Self-pay | Admitting: Cardiovascular Disease

## 2020-02-07 NOTE — Telephone Encounter (Signed)
   [facility] Pre-operative Risk Assessment    Request for surgical clearance:  1. What type of surgery is being performed? Osseous Surgery  2. When is this surgery scheduled? ASAP   3. What type of clearance is required (medical clearance vs. Pharmacy clearance to hold med vs. Both)? both  4. Are there any medications that need to be held prior to surgery and how long? Not listed, would you recommend an INR reading of 1-2 days prior to extractions if on blood thinners?  Please provide specific instructions regarding pre/post operative medication usage.    5. Practice name and name of physician performing surgery? [facility]   6. What is your office phone number [phone]   7.   What is your office fax number [phone]  8.   Anesthesia type (None, local, MAC, general) ? 4% articaine 1:100,000 with Epinephrine    Ace Gins 02/07/2020, 10:07 AM  _________________________________________________________________   (provider comments below)

## 2020-02-07 NOTE — Telephone Encounter (Signed)
   Primary Cardiologist: Ida Rogue, MD  Chart reviewed as part of pre-operative protocol coverage. Left voice mail to call back.   Hx of CAD s/p inferior STEMI 01/2011 with occlusion and thrombus to the mid circumflex, Status post 3.0 x 20 mm drug-eluting stent also with residual significant ostial OM1 disease, mild to moderate LAD disease. On DAPT with ASA and Plavix.   Dr. Rockey Situ, please give your recommendations. Please forward your response to [facility] PREOP.   Thank you  Leanor Kail, PA 02/07/2020, 2:19 PM

## 2020-02-08 NOTE — Telephone Encounter (Signed)
   Attempted to call patient, voicemail left callback.   Reino Bellis, NP 02/08/2020, 1:37 PM

## 2020-02-08 NOTE — Telephone Encounter (Signed)
Hold Plavix 5 days before procedure Would prefer he stay on low-dose aspirin

## 2020-02-09 NOTE — Telephone Encounter (Signed)
Left Voicemail to call back

## 2020-02-10 NOTE — Telephone Encounter (Signed)
I will forward call to pre op. Looks like pre op provider has been trying to reach the pt.

## 2020-02-10 NOTE — Telephone Encounter (Signed)
   Primary Cardiologist: Ida Rogue, MD  Chart reviewed as part of pre-operative protocol coverage.  Attempted to contact the patient, again with no answer. Left a voicemail to call back for ongoing preop evaluation.  Abigail Butts, PA-C 02/10/2020, 2:17 PM

## 2020-02-10 NOTE — Telephone Encounter (Signed)
Patient is returning the call  

## 2020-02-13 NOTE — Telephone Encounter (Signed)
The patient sent a MyChart message back to the [facility] stating he had been on vacation and asking what to do with his meds.  I advised the patient per MyChart, he will need to (per Dr. Rockey Situ):  Hold Plavix 5 days before procedure Would prefer he stay on low-dose aspirin  Routing to pre-op pool as an FYI.

## 2020-02-13 NOTE — Telephone Encounter (Signed)
   Primary Cardiologist: Ida Rogue, MD  Chart reviewed as part of pre-operative protocol coverage. Patient was contacted 02/13/2020 in reference to pre-operative risk assessment for pending surgery as outlined below.  Charles Holder was last seen on 7/20 by Dr. Rockey Situ.  Since that day, Charles Holder has done well from a cardiac standpoint. No anginal symptoms or exertional dyspnea reported.  Therefore, based on ACC/AHA guidelines, the patient would be at acceptable risk for the planned procedure without further cardiovascular testing.   Recommendations per Dr. Rockey Situ- hold plavix 5 days prior to procedure. Prefer that he remains on aspirin 81mg  daily.  I will route this recommendation to the requesting party via Epic fax function and remove from pre-op pool.  Please call with questions.  Reino Bellis, NP 02/13/2020, 12:57 PM

## 2020-03-13 NOTE — Telephone Encounter (Signed)
Attempted to reach patient via phone. No answer on home or mobile number.

## 2020-04-17 ENCOUNTER — Ambulatory Visit: Payer: Medicare PPO | Admitting: Urology

## 2020-04-18 ENCOUNTER — Other Ambulatory Visit: Payer: Self-pay | Admitting: *Deleted

## 2020-04-18 DIAGNOSIS — C61 Malignant neoplasm of prostate: Secondary | ICD-10-CM

## 2020-04-20 ENCOUNTER — Other Ambulatory Visit: Payer: Medicare PPO

## 2020-04-20 ENCOUNTER — Other Ambulatory Visit: Payer: Self-pay

## 2020-04-20 ENCOUNTER — Other Ambulatory Visit: Payer: Self-pay | Admitting: Cardiovascular Disease

## 2020-04-20 DIAGNOSIS — C61 Malignant neoplasm of prostate: Secondary | ICD-10-CM

## 2020-04-20 DIAGNOSIS — M79602 Pain in left arm: Secondary | ICD-10-CM

## 2020-04-21 LAB — PSA: Prostate Specific Ag, Serum: 4.9 ng/mL — ABNORMAL HIGH (ref 0.0–4.0)
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
## 2020-04-24 NOTE — Progress Notes (Signed)
04/25/2020 10:12 AM   Charles Holder 1966-04-07 [id number]  Referring provider: Abner Greenspan, MD [address] [place],  [place] [address]  Chief Complaint  Patient presents with  . Benign Prostatic Hypertrophy    HPI: Charles Holder is a 54 y.o. male with a history of low risk cT1c Gleason 3+3 prostate cancer, iPSA 5.4 dx 11/17 s/p recent confirmatory prostate biopsy 09/2017 who returns today for follow up.    Patient elected to pursue ProMark proteomic prognostic test on his most recent prostate biopsy specimen.  His pro [name] score was 41 which predicts a 24% chance of aggressive disease.  "Aggressive disease" is defined as unfavorable pathology in your prostate, P T3a are T3b disease, or nodal or distant metastases.  On the study, 24% risk is an 11% reduction of the risk predicted from biopsy alone.  Dx 08/2016 Low Risk Prostate Cancer T1c, PSA 5.4, prostate 46 grams Gleason 3+3=6, 4 cores, all left mid and left apex, 57-83 %; +PNI in left mid medial core   Repeat bx 09/24/17 T1c, PSA 3.6, TRUS vol 37.7 Gleason 3+3, 7/12 cores, primarily on left, 77-87% (negligible 1% in single isolated corn right), positive perineural invasion  Most recent PSA on 04/20/2020 4.9.   No recent herpes outbreak.  Taking Valtrex QOD.     Mild baseline ED, carries NG    PMHx significant for CAD s/p PCI with stents on ASA/ plavix, cardiomyopathy, DM, HTN, HL, Afib.    I PSS 5/3   PVR:  40 mL    Previous IPSS 13/4   UA unremarkable.    IPSS    Row Name 04/25/20 0900         International Prostate Symptom Score   How often have you had the sensation of not emptying your bladder? Not at All     How often have you had to urinate less than every two hours? Less than 1 in 5 times     How often have you found you stopped and started again several times when you urinated? Not at All     How often have you found it difficult to postpone urination? Less than 1 in 5 times     How  often have you had a weak urinary stream? Less than 1 in 5 times     How often have you had to strain to start urination? Not at All     How many times did you typically get up at night to urinate? 2 Times     Total IPSS Score 5       Quality of Life due to urinary symptoms   If you were to spend the rest of your life with your urinary condition just the way it is now how would you feel about that? Mixed            Score:  1-7 Mild 8-19 Moderate 20-35 Severe  PMH: Past Medical History:  Diagnosis Date  . Adopted   . Anxiety   . Atrial fibrillation ([place])   . BPH (benign prostatic hyperplasia)   . Cardiomyopathy, ischemic 01/22/2011   EF 50%  . Chronic lower back pain    s/p L5-S1  . Coronary artery disease   . Depression   . ED (erectile dysfunction)   . Fatigue   . Headache   . Heart attack ([place])   . Herpes genitalia   . Hyperlipidemia   . Hypertension   . Hypogonadism in  male   . Neuropathy   . Obesity   . Prostate cancer ([place])   . SOB (shortness of breath)     Surgical History: Past Surgical History:  Procedure Laterality Date  . back surgeries     x 4  . CORONARY ANGIOPLASTY  01/22/2011   stent    Home Medications:  Allergies as of 04/25/2020      Reactions   Codeine    Makes me sick       Medication List       Accurate as of April 25, 2020 10:12 AM. If you have any questions, ask your nurse or doctor.        STOP taking these medications   amoxicillin-clavulanate 875-125 MG tablet Commonly known as: AUGMENTIN Stopped by: Ida Rogue, MD     TAKE these medications   amLODipine 10 MG tablet Commonly known as: NORVASC TAKE ONE TABLET EVERY DAY   aspirin 81 MG EC tablet Take 81 mg by mouth daily.   clopidogrel 75 MG tablet Commonly known as: PLAVIX TAKE ONE TABLET EVERY DAY   ezetimibe 10 MG tablet Commonly known as: ZETIA Take 1 tablet (10 mg total) by mouth daily.   Fish Oil 1000 MG Caps Take by mouth.   MULTIVITAMIN ADULT  PO Take by mouth.   nitroGLYCERIN 0.4 MG SL tablet Commonly known as: NITROSTAT PLACE 1 TABLET UNDER TONGUE EVERY 5 MIN AS NEEDED FOR CHEST PAIN IF NO RELIEF IN15 MIN CALL 911 (MAX 3 TABS)   PROBIOTIC ADVANCED PO Take by mouth.   valACYclovir 1000 MG tablet Commonly known as: VALTREX Take 1 tablet (1,000 mg total) by mouth daily.       Allergies:  Allergies  Allergen Reactions  . Codeine     Makes me sick     Family History: Family History  Adopted: Yes  Problem Relation Age of Onset  . Prostate cancer Neg Hx     Social History:  reports that he has been smoking cigarettes. He has been smoking about 1.00 pack per day. He has never used smokeless tobacco. He reports current drug use. Drug: Marijuana. He reports that he does not drink alcohol.  ROS: For pertinent review of systems please refer to history of present illness  Physical Exam: BP 122/78   Pulse 78   Ht 5\' 10"  (1.778 m)   Wt 188 lb (85.3 kg)   BMI 26.98 kg/m   Constitutional:  Well nourished. Alert and oriented, No acute distress. HEENT: [place] AT, mask in place.  Trachea midline Cardiovascular: No clubbing, cyanosis, or edema. Respiratory: Normal respiratory effort, no increased work of breathing. GI: Abdomen is soft, non tender, non distended, no abdominal masses. Liver and spleen not palpable.  Umbilical hernia appreciated.  Stool sample for occult testing is not indicated.   GU: No CVA tenderness.  No bladder fullness or masses.  Patient with uncircumcised phallus.  Foreskin easily retracted   Urethral meatus is patent.  No penile discharge. No penile lesions or rashes. Scrotum without lesions, cysts, rashes and/or edema.  Testicles are located scrotally bilaterally. No masses are appreciated in the testicles. Left and right epididymis are normal. Rectal: Patient with  normal sphincter tone. Anus and perineum without scarring or rashes. No rectal masses are appreciated. Prostate is approximately 45 grams, no  nodules are appreciated. Seminal vesicles could not be palpated Skin: No rashes, bruises or suspicious lesions. Lymph: No inguinal adenopathy. Neurologic: Grossly intact, no focal deficits, moving all 4 extremities.  Psychiatric: Normal mood and affect.   Laboratory Data: Lab Results  Component Value Date   WBC 14.3 (H) 12/09/2017   HGB 17.0 12/09/2017   HCT 49.7 12/09/2017   MCV 89.6 12/09/2017   PLT 313.0 12/09/2017    Lab Results  Component Value Date   CREATININE 1.22 12/09/2017    Lab Results  Component Value Date   HGBA1C  01/23/2011    5.5 (NOTE)                                                                       According to the ADA Clinical Practice Recommendations for 2011, when HbA1c is used as a screening test:   >=6.5%   Diagnostic of Diabetes Mellitus           (if abnormal result  is confirmed)  5.7-6.4%   Increased risk of developing Diabetes Mellitus  References:Diagnosis and Classification of Diabetes Mellitus,Diabetes DTOI,7124,58(KDXIP 1):S62-S69 and Standards of Medical Care in         Diabetes - 2011,Diabetes Care,2011,34  (Suppl 1):S11-S61.    Component     Latest Ref Rng & Units 03/12/2016 04/11/2016 08/07/2016 08/07/2016           2:04 PM  2:04 PM  Prostate Specific Ag, Serum     0.0 - 4.0 ng/mL 5.2 (H) 3.4 5.4 (H) 5.4 (H)   Component     Latest Ref Rng & Units 12/18/2016 04/24/2017 09/24/2017 04/20/2019             Prostate Specific Ag, Serum     0.0 - 4.0 ng/mL 3.5 2.7 3.6 2.8   Component     Latest Ref Rng & Units 04/20/2020          Prostate Specific Ag, Serum     0.0 - 4.0 ng/mL 4.9 (H)    Urinalysis Component     Latest Ref Rng & Units 04/25/2020  Specific Gravity, UA     1.005 - 1.030 1.010  pH, UA     5.0 - 7.5 6.0  Color, UA     Yellow Yellow  Appearance Ur     Clear Clear  Leukocytes,UA     Negative Negative  Protein,UA     Negative/Trace Negative  Glucose, UA     Negative Negative  Ketones, UA     Negative Negative    RBC, UA     Negative Trace (A)  Bilirubin, UA     Negative Negative  Urobilinogen, Ur     0.2 - 1.0 mg/dL 0.2  Nitrite, UA     Negative Negative  Microscopic Examination      See below:   Component     Latest Ref Rng & Units 04/25/2020  WBC, UA     0 - 5 /hpf 0-5  RBC     0 - 2 /hpf 0-2  Epithelial Cells (non renal)     0 - 10 /hpf None seen  Bacteria, UA     None seen/Few None seen    Pertinent Imaging: Results for KAITLYN, SKOWRON (MRN [id number]) as of 04/25/2020 09:59  Ref. Range 04/25/2020 09:52  Scan Result Unknown 40    Assessment & Plan:    1. Cancer of  prostate with low recurrence risk (stage T1-2a and Gleason < 7 and PSA < 10) ([place]) Promark study was somewhat reassuring PSA has trended upward to 4.9 - prostate exam unchanged Patient is not wanting to undergo a repeat biopsy at this time, so we will pursue a prostate MRI to look for concerning lesions I will contact patient with results  2. BPH with obstruction/lower urinary tract symptoms Continue conservative management  3. Herpes simplex Refill of Valtrex given   Return for pending prostate MRI results .  Zara Council, PA-C  [facility] [address] [place], [place] [address] [phone]

## 2020-04-25 ENCOUNTER — Encounter: Payer: Self-pay | Admitting: Cardiovascular Disease

## 2020-04-25 ENCOUNTER — Ambulatory Visit (INDEPENDENT_AMBULATORY_CARE_PROVIDER_SITE_OTHER): Payer: Medicare PPO | Admitting: Cardiovascular Disease

## 2020-04-25 ENCOUNTER — Other Ambulatory Visit: Payer: Self-pay

## 2020-04-25 ENCOUNTER — Ambulatory Visit (INDEPENDENT_AMBULATORY_CARE_PROVIDER_SITE_OTHER): Payer: Medicare PPO | Admitting: Urology

## 2020-04-25 ENCOUNTER — Encounter: Payer: Self-pay | Admitting: Urology

## 2020-04-25 VITALS — BP 122/78 | HR 78 | Ht 70.0 in | Wt 188.0 lb

## 2020-04-25 VITALS — BP 122/70 | HR 54 | Ht 70.0 in | Wt 188.5 lb

## 2020-04-25 DIAGNOSIS — I10 Essential (primary) hypertension: Secondary | ICD-10-CM | POA: Diagnosis not present

## 2020-04-25 DIAGNOSIS — F17200 Nicotine dependence, unspecified, uncomplicated: Secondary | ICD-10-CM | POA: Diagnosis not present

## 2020-04-25 DIAGNOSIS — I25118 Atherosclerotic heart disease of native coronary artery with other forms of angina pectoris: Secondary | ICD-10-CM | POA: Diagnosis not present

## 2020-04-25 DIAGNOSIS — N401 Enlarged prostate with lower urinary tract symptoms: Secondary | ICD-10-CM | POA: Diagnosis not present

## 2020-04-25 DIAGNOSIS — N138 Other obstructive and reflux uropathy: Secondary | ICD-10-CM

## 2020-04-25 DIAGNOSIS — B009 Herpesviral infection, unspecified: Secondary | ICD-10-CM

## 2020-04-25 DIAGNOSIS — C61 Malignant neoplasm of prostate: Secondary | ICD-10-CM

## 2020-04-25 DIAGNOSIS — E7800 Pure hypercholesterolemia, unspecified: Secondary | ICD-10-CM

## 2020-04-25 LAB — BLADDER SCAN AMB NON-IMAGING: Scan Result: 40

## 2020-04-25 MED ORDER — VALACYCLOVIR HCL 1 G PO TABS: 1000.0000 mg | ORAL_TABLET | Freq: Every day | ORAL | 0 refills | Status: DC

## 2020-04-25 NOTE — Patient Instructions (Addendum)
Medication Instructions:  No changes  If you need a refill on your cardiac medications before your next appointment, please call your pharmacy.    Lab work: Labs: lipids, CMP   If you have labs (blood work) drawn today and your tests are completely normal, you will receive your results only by: Marland Kitchen MyChart Message (if you have MyChart) OR . A paper copy in the mail If you have any lab test that is abnormal or we need to change your treatment, we will call you to review the results.   Testing/Procedures: No new testing needed   Follow-Up: At [facility], you and your health needs are our priority.  As part of our continuing mission to provide you with exceptional heart care, we have created designated Provider Care Teams.  These Care Teams include your primary Cardiologist (physician) and Advanced Practice Providers (APPs -  Physician Assistants and Nurse Practitioners) who all work together to provide you with the care you need, when you need it.  . You will need a follow up appointment in 12 months   . Providers on your designated Care Team:   . Murray Hodgkins, NP . Christell Faith, PA-C . Marrianne Mood, PA-C  Any Other Special Instructions Will Be Listed Below (If Applicable).  COVID-19 Vaccine Information can be found at: [email or web address] For questions related to vaccine distribution or appointments, please email [email or web address] or call [phone].

## 2020-04-26 LAB — COMPREHENSIVE METABOLIC PANEL
ALT: 20 IU/L (ref 0–44)
AST: 17 IU/L (ref 0–40)
Albumin/Globulin Ratio: 1.9 (ref 1.2–2.2)
Albumin: 4.5 g/dL (ref 3.8–4.9)
Alkaline Phosphatase: 82 IU/L (ref 48–121)
BUN/Creatinine Ratio: 12 (ref 9–20)
BUN: 13 mg/dL (ref 6–24)
Bilirubin Total: 0.4 mg/dL (ref 0.0–1.2)
CO2: 21 mmol/L (ref 20–29)
Calcium: 9.8 mg/dL (ref 8.7–10.2)
Chloride: 108 mmol/L — ABNORMAL HIGH (ref 96–106)
Creatinine, Ser: 1.07 mg/dL (ref 0.76–1.27)
GFR calc Af Amer: 90 mL/min/{1.73_m2} (ref >59)
GFR calc non Af Amer: 78 mL/min/{1.73_m2} (ref >59)
Globulin, Total: 2.4 g/dL (ref 1.5–4.5)
Glucose: 92 mg/dL (ref 65–99)
Potassium: 4.8 mmol/L (ref 3.5–5.2)
Sodium: 142 mmol/L (ref 134–144)
Total Protein: 6.9 g/dL (ref 6.0–8.5)

## 2020-04-26 LAB — URINALYSIS, COMPLETE
Bilirubin, UA: NEGATIVE
Glucose, UA: NEGATIVE
Ketones, UA: NEGATIVE
Leukocytes,UA: NEGATIVE
Nitrite, UA: NEGATIVE
Protein,UA: NEGATIVE
Specific Gravity, UA: 1.01 (ref 1.005–1.030)
Urobilinogen, Ur: 0.2 mg/dL (ref 0.2–1.0)
pH, UA: 6 (ref 5.0–7.5)

## 2020-04-26 LAB — MICROSCOPIC EXAMINATION
Bacteria, UA: NONE SEEN
Epithelial Cells (non renal): NONE SEEN /hpf (ref 0–10)

## 2020-04-26 LAB — LIPID PANEL
Chol/HDL Ratio: 6.1 ratio — ABNORMAL HIGH (ref 0.0–5.0)
Cholesterol, Total: 182 mg/dL (ref 100–199)
HDL: 30 mg/dL — ABNORMAL LOW (ref >39)
LDL Chol Calc (NIH): 130 mg/dL — ABNORMAL HIGH (ref 0–99)
Triglycerides: 118 mg/dL (ref 0–149)
VLDL Cholesterol Cal: 22 mg/dL (ref 5–40)

## 2020-05-21 ENCOUNTER — Telehealth: Payer: Self-pay | Admitting: *Deleted

## 2020-05-21 DIAGNOSIS — R079 Chest pain, unspecified: Secondary | ICD-10-CM

## 2020-05-21 NOTE — Telephone Encounter (Signed)
-----   Message from Minna Merritts, MD sent at 04/30/2020  8:27 AM EDT ----- Lab work reviewed Total cholesterol has jumped to 149 up to Charles Holder is not controlling his numbers well enough We need to consider injectable medication, Repatha or Praluent Goal LDL less than 70 preferably 60 and we are 130

## 2020-05-21 NOTE — Telephone Encounter (Signed)
Reviewed results and recommendations with patient. He states that he does not want to do the injectables and that Dr. Rockey Situ mentioned Crestor to him. Advised that I would send message to him about this and would call him back with recommendations. He also states that provider mentioned some type of advanced testing to scan his body. Inquired if this was a CT Calcium score and he does not remember the specific test. Let him know that I would add this request to note. He verbalized understanding with no further questions at this time.

## 2020-05-23 MED ORDER — ROSUVASTATIN CALCIUM 5 MG PO TABS
5.0000 mg | ORAL_TABLET | Freq: Every day | ORAL | 3 refills | Status: DC
Start: 2020-05-23 — End: 2021-02-22

## 2020-05-23 NOTE — Telephone Encounter (Signed)
Spoke with patients partner and reviewed provider recommendations. Sent in medication to pharmacy and placed order for stress testing. Will have scheduling call and assist with getting this scheduled for August 25th.

## 2020-05-23 NOTE — Telephone Encounter (Signed)
Would stay on Zetia start Crestor 5 mg daily We will likely need higher dose of Crestor but will start slowly  Stress test was previously offered which would help with work-up of his chest pain Previously discussed cardiac CTA though given prior stenting, difficult to see within the stent on CT scan

## 2020-05-24 NOTE — Telephone Encounter (Signed)
Attempted to schedule no ans no vm  

## 2020-05-28 ENCOUNTER — Telehealth: Payer: Self-pay | Admitting: Urology

## 2020-05-28 NOTE — Telephone Encounter (Signed)
Would you call Charles Holder and remind him that we need to get a prostate MRI?

## 2020-05-28 NOTE — Telephone Encounter (Signed)
LMOM for patient to return call.

## 2020-05-29 NOTE — Telephone Encounter (Signed)
Patient notified and he states he will have the MRI done.

## 2020-06-01 NOTE — Telephone Encounter (Signed)
Attempted to schedule.  LMOV to call office.  ° °

## 2020-06-05 NOTE — Telephone Encounter (Signed)
Patient declined stress test at this time and wants to wait.

## 2020-06-16 ENCOUNTER — Other Ambulatory Visit: Payer: Self-pay | Admitting: Cardiovascular Disease

## 2020-07-18 ENCOUNTER — Other Ambulatory Visit: Payer: Self-pay | Admitting: Cardiovascular Disease

## 2020-07-18 DIAGNOSIS — M79602 Pain in left arm: Secondary | ICD-10-CM

## 2020-07-23 ENCOUNTER — Encounter: Payer: Self-pay | Admitting: Urology

## 2020-08-01 ENCOUNTER — Other Ambulatory Visit: Payer: Self-pay | Admitting: Urology

## 2020-08-01 DIAGNOSIS — C61 Malignant neoplasm of prostate: Secondary | ICD-10-CM

## 2020-08-01 NOTE — Progress Notes (Signed)
New orders in for prostate mri.

## 2020-08-07 ENCOUNTER — Other Ambulatory Visit: Payer: Self-pay

## 2020-08-07 ENCOUNTER — Ambulatory Visit
Admission: RE | Admit: 2020-08-07 | Discharge: 2020-08-07 | Disposition: A | Discharge status: Home / Self Care | Payer: Medicare PPO | Source: Ambulatory Visit | Attending: Cardiovascular Disease | Admitting: Cardiovascular Disease

## 2020-08-07 DIAGNOSIS — R079 Chest pain, unspecified: Secondary | ICD-10-CM | POA: Insufficient documentation

## 2020-08-07 LAB — NM MYOCAR MULTI W/SPECT W/WALL MOTION / EF
LV dias vol: 88 mL (ref 62–150)
LV sys vol: 35 mL
Peak HR: 73 {beats}/min
Percent HR: 44 %
Rest HR: 57 {beats}/min
TID: 1

## 2020-08-07 MED ORDER — REGADENOSON 0.4 MG/5ML IV SOLN
0.4000 mg | Freq: Once | INTRAVENOUS | Status: AC
  Administered 2020-08-07: 0.4 mg via INTRAVENOUS

## 2020-08-07 MED ORDER — TECHNETIUM TC 99M TETROFOSMIN IV KIT
9.4650 | PACK | Freq: Once | INTRAVENOUS | Status: AC | PRN
  Administered 2020-08-07: 9.465 via INTRAVENOUS

## 2020-08-07 MED ORDER — TECHNETIUM TC 99M TETROFOSMIN IV KIT
27.1820 | PACK | Freq: Once | INTRAVENOUS | Status: AC | PRN
  Administered 2020-08-07: 27.182 via INTRAVENOUS

## 2020-08-14 ENCOUNTER — Telehealth: Payer: Self-pay | Admitting: *Deleted

## 2020-08-14 NOTE — Telephone Encounter (Signed)
Lm to call back ./cy 

## 2020-08-14 NOTE — Telephone Encounter (Signed)
-----   Message from Minna Merritts, MD sent at 08/12/2020 11:47 AM EDT ----- Stress test result Inferior wall defect noted ,suspected this is artifact, unable to exclude region of old scar No significant ischemia, or concern for new blockages Neck step would be up to him, if symptoms do get worse any other option would be catheterization.  If he continues to be very active as he was in the past with no angina on doing his activities, may be able to delay any further invasive procedures

## 2020-08-20 NOTE — Telephone Encounter (Signed)
Patient is returning your call.  

## 2020-08-20 NOTE — Telephone Encounter (Signed)
Left voicemail message to call back  

## 2020-08-20 NOTE — Telephone Encounter (Signed)
Spoke with patients fiance per release form. Reviewed results per Dr. Rockey Situ and she verbalized understanding. She did want me to make Dr. Rockey Situ aware of the following comment from her.   Fiance Charles Holder states that Charles Holder has arm pain and chest pain when nervous about family situations with elderly family members. She feels that this is from anxiety related emotions. He unloaded quartz rocks as big as basket ball or bigger and had no problems. She thinks that the scarring from previous heart attack years ago.   She verbalized understanding of results and will pass this on to him. No further questions at this time.

## 2020-08-22 ENCOUNTER — Ambulatory Visit
Admission: RE | Admit: 2020-08-22 | Discharge: 2020-08-22 | Disposition: A | Discharge status: Home / Self Care | Payer: Medicare PPO | Source: Ambulatory Visit | Attending: Urology | Admitting: Urology

## 2020-08-22 ENCOUNTER — Other Ambulatory Visit: Payer: Self-pay

## 2020-08-22 DIAGNOSIS — R590 Localized enlarged lymph nodes: Secondary | ICD-10-CM | POA: Diagnosis not present

## 2020-08-22 DIAGNOSIS — C61 Malignant neoplasm of prostate: Secondary | ICD-10-CM | POA: Diagnosis not present

## 2020-08-22 DIAGNOSIS — R972 Elevated prostate specific antigen [PSA]: Secondary | ICD-10-CM | POA: Diagnosis not present

## 2020-08-22 MED ORDER — GADOBUTROL 1 MMOL/ML IV SOLN
8.0000 mL | Freq: Once | INTRAVENOUS | Status: AC | PRN
  Administered 2020-08-22: 8 mL via INTRAVENOUS

## 2020-08-22 NOTE — Telephone Encounter (Signed)
Okay to continue the medical management, Can take nitro as needed for chest pain in the setting of anxiety situations No restriction on activities if he is moving rock

## 2020-08-22 NOTE — Telephone Encounter (Signed)
No answer on either phone number. Left detailed message, ok per DPR, with Dr Donivan Scull recommendations and to call back if they have any further questions.

## 2020-09-24 ENCOUNTER — Telehealth: Payer: Self-pay | Admitting: Family Medicine

## 2020-09-24 NOTE — Telephone Encounter (Signed)
-----   Message from Nori Riis, PA-C sent at 09/24/2020  8:36 AM EST ----- I sent Mr. Coaxum a MyChart message regarding his prostate MRI results and recommended that he have a fusion biopsy at [facility], but I have not received a message back from him.  Is he willing to undergo the fusion biopsy?

## 2020-09-24 NOTE — Telephone Encounter (Signed)
I tried to reach Charles Holder to speak to him further regarding his elevated PSA, prostate cancer and prostate MRI results.    He needs to be aware that he has prostate cancer and he is currently under what is called active surveillance.  This is when we keep a close eye on his PSA and prostate with exams.  If he should have an increase in his PSA or an abnormal prostate exam, it is strongly recommended that he undergo a biopsy.    He has had an increase in his PSA, so we need to evaluate him for a change in his prostate cancer.  If it has become more aggressive, we will need to pursue other modes of treatment (surgery, radiation, etc.).  The only way to know is to obtain a specimen from the prostate for the pathologist to look at.  The prostate MRI has found a lesion and if he has his biopsy in [place], they can do a fusion biopsy.  This is a more targeted biopsy than the ones we perform in [place].    Doing a PSA every 6 months is not going to give [place] the information we need.

## 2020-09-24 NOTE — Telephone Encounter (Signed)
Spoke to patient and he states he is not doing anymore Biopsies. He states he will do PSA labs every 6 months if possible.

## 2020-09-26 NOTE — Telephone Encounter (Signed)
LMOM for patient to return call.

## 2020-10-01 NOTE — Telephone Encounter (Signed)
Spoke to patient and he states he is going to think about it and call us to let us know what he is going to do.

## 2020-10-16 ENCOUNTER — Telehealth: Payer: Self-pay | Admitting: Urology

## 2020-10-16 NOTE — Telephone Encounter (Signed)
Patient returned the call and lab appointment made.

## 2020-10-16 NOTE — Telephone Encounter (Signed)
LMOM to c/b to schedule lab appointment for PSA check.

## 2020-10-16 NOTE — Telephone Encounter (Signed)
Would you call him and schedule him for a PSA only?

## 2020-10-17 ENCOUNTER — Other Ambulatory Visit: Payer: Self-pay

## 2020-10-17 ENCOUNTER — Other Ambulatory Visit: Payer: Medicare Other

## 2020-10-17 DIAGNOSIS — C61 Malignant neoplasm of prostate: Secondary | ICD-10-CM

## 2020-10-18 LAB — PSA: Prostate Specific Ag, Serum: 2.3 ng/mL (ref 0.0–4.0)

## 2020-10-25 ENCOUNTER — Other Ambulatory Visit: Payer: Self-pay | Admitting: Urology

## 2020-10-25 DIAGNOSIS — B009 Herpesviral infection, unspecified: Secondary | ICD-10-CM

## 2020-11-26 ENCOUNTER — Ambulatory Visit (INDEPENDENT_AMBULATORY_CARE_PROVIDER_SITE_OTHER): Payer: Medicare Other | Admitting: Family Medicine

## 2020-11-26 ENCOUNTER — Other Ambulatory Visit: Payer: Self-pay

## 2020-11-26 ENCOUNTER — Encounter: Payer: Self-pay | Admitting: Family Medicine

## 2020-11-26 VITALS — BP 114/78 | HR 64 | Temp 96.8°F | Ht 70.0 in | Wt 194.0 lb

## 2020-11-26 DIAGNOSIS — F32A Depression, unspecified: Secondary | ICD-10-CM | POA: Diagnosis not present

## 2020-11-26 DIAGNOSIS — F17200 Nicotine dependence, unspecified, uncomplicated: Secondary | ICD-10-CM

## 2020-11-26 DIAGNOSIS — F419 Anxiety disorder, unspecified: Secondary | ICD-10-CM

## 2020-11-26 MED ORDER — ESCITALOPRAM OXALATE 10 MG PO TABS: 10.0000 mg | ORAL_TABLET | Freq: Every day | ORAL | 3 refills | Status: DC
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
## 2020-11-26 NOTE — Patient Instructions (Addendum)
apps for meditation  Calm and Headspace  Consider practicing this   If you want to see a counselor again   Start lexapro 10 mg daily (try evening)   Stay active and get outdoors   If you feel worse -stop the medicine and let me know  Any intolerable side effects also

## 2021-01-14 ENCOUNTER — Encounter: Payer: Self-pay | Admitting: Family Medicine

## 2021-01-14 ENCOUNTER — Ambulatory Visit (INDEPENDENT_AMBULATORY_CARE_PROVIDER_SITE_OTHER): Payer: Medicare Other | Admitting: Family Medicine

## 2021-01-14 ENCOUNTER — Other Ambulatory Visit: Payer: Self-pay

## 2021-01-14 VITALS — BP 126/76 | HR 52 | Temp 96.9°F | Ht 70.0 in | Wt 202.1 lb

## 2021-01-14 DIAGNOSIS — F419 Anxiety disorder, unspecified: Secondary | ICD-10-CM | POA: Diagnosis not present

## 2021-01-14 DIAGNOSIS — F32A Depression, unspecified: Secondary | ICD-10-CM | POA: Diagnosis not present

## 2021-01-14 MED ORDER — BUSPIRONE HCL 15 MG PO TABS: 15.0000 mg | ORAL_TABLET | Freq: Two times a day (BID) | ORAL | 5 refills | Status: DC

## 2021-01-14 NOTE — Assessment & Plan Note (Signed)
lexapro 10 caused irritability and inc appetite 5 mg -not helpful  Reviewed stressors/ coping techniques/symptoms/ support sources/ tx options and side effects in detail today Will try buspar 7.5 mg bid to see if this helps more re: anxiety  Disc self care, enc more outdoor time and exercise  Discussed expectations of SSRI medication including time to effectiveness and mechanism of action, also poss of side effects (early and late)- including mental fuzziness, weight or appetite change, nausea and poss of worse dep or anxiety (even suicidal thoughts)  Pt voiced understanding and will stop med and update if this occurs   Inst pt to update [place] 1-2 mo and if helpful will continue or titrate the buspar

## 2021-01-14 NOTE — Patient Instructions (Addendum)
Stop the lexapro   Try buspar 1/2 pill (7.5 mg) twice daily  Let us know how you do  If intolerable side effects or worse mood please stop it and let us know   Take care of yourself  Stay as active as you can  Getting outdoors and getting exercise are essential for mood   Let us know how you are doing in 1-2 months

## 2021-01-14 NOTE — Progress Notes (Signed)
Subjective:    Patient ID: Charles Holder, male    DOB: [birth year], 55 y.o.   MRN: [id number]  This visit occurred during the SARS-CoV-2 public health emergency.  Safety protocols were in place, including screening questions prior to the visit, additional usage of staff PPE, and extensive cleaning of exam room while observing appropriate contact time as indicated for disinfecting solutions.    HPI Pt presents for f/u of mood   Wt Readings from Last 3 Encounters:  01/14/21 202 lb 2 oz (91.7 kg)  11/26/20 194 lb (88 kg)  04/25/20 188 lb (85.3 kg)   29.00 kg/m  Last visit discussed symptoms of anxiety and depression with poor self esteem and negative thoughts  Started generic lexapro at 10 mg daily  Encouraged good self care He was not interested in counseling   Pt tried the medicine- and a whole pill made him irritable  Then went down to 1/2 pill daily  Made his appetite go up   Tried wellbutrin in the past - did not help   He thinks he will always have low self esteem  The more severe symptoms from way back are better   He does feel anxious and on edge   Less exercise in the winter   Patient Active Problem List   Diagnosis Date Noted  . Dental abscess 01/12/2019  . Antiplatelet or antithrombotic long-term use 12/16/2017  . Special screening for malignant neoplasms, colon 12/16/2017  . Leukocytosis 08/20/2016  . Anxiety and depression 08/18/2016  . Prostate cancer ([place]) 03/14/2016  . Herpes 03/14/2016  . Essential hypertension 08/31/2012  . CAD (coronary artery disease) 02/07/2011  . Stented coronary artery 02/07/2011  . Smoking 02/07/2011  . Mixed hyperlipidemia 02/07/2011  . Chronic back pain 06/05/2008   Past Medical History:  Diagnosis Date  . Adopted   . Anxiety   . Atrial fibrillation ([place])   . BPH (benign prostatic hyperplasia)   . Cardiomyopathy, ischemic 01/22/2011   EF 50%  . Chronic lower back pain    s/p L5-S1  . Coronary artery disease   .  Depression   . ED (erectile dysfunction)   . Fatigue   . Headache   . Heart attack ([place])   . Herpes genitalia   . Hyperlipidemia   . Hypertension   . Hypogonadism in male   . Neuropathy   . Obesity   . Prostate cancer ([place])   . SOB (shortness of breath)    Past Surgical History:  Procedure Laterality Date  . back surgeries     x 4  . CORONARY ANGIOPLASTY  01/22/2011   stent   Social History   Tobacco Use  . Smoking status: Current Every Day Smoker    Packs/day: 1.00    Types: Cigarettes  . Smokeless tobacco: Never Used  Vaping Use  . Vaping Use: Former  Substance Use Topics  . Alcohol use: No  . Drug use: Yes    Types: Marijuana   Family History  Adopted: Yes  Problem Relation Age of Onset  . Prostate cancer Neg Hx    Allergies  Allergen Reactions  . Codeine     Makes me sick    Current Outpatient Medications on File Prior to Visit  Medication Sig Dispense Refill  . amLODipine (NORVASC) 10 MG tablet TAKE ONE TABLET EVERY DAY 90 tablet 1  . aspirin 81 MG EC tablet Take 81 mg by mouth daily.    . clopidogrel (PLAVIX) 75 MG tablet  TAKE ONE TABLET EVERY DAY 90 tablet 1  . ezetimibe (ZETIA) 10 MG tablet TAKE ONE TABLET EVERY DAY 90 tablet 2  . Multiple Vitamins-Minerals (MULTIVITAMIN ADULT PO) Take by mouth.    . nitroGLYCERIN (NITROSTAT) 0.4 MG SL tablet PLACE 1 TABLET UNDER TONGUE EVERY 5 MIN AS NEEDED FOR CHEST PAIN IF NO RELIEF IN15 MIN CALL 911 (MAX 3 TABS) 25 tablet 0  . Omega-3 Fatty Acids (FISH OIL) 1000 MG CAPS Take by mouth.    . Probiotic Product (PROBIOTIC ADVANCED PO) Take by mouth.    . valACYclovir (VALTREX) 1000 MG tablet TAKE 1 TABLET BY MOUTH DAILY 90 tablet 0  . rosuvastatin (CRESTOR) 5 MG tablet Take 1 tablet (5 mg total) by mouth daily. 90 tablet 3   No current facility-administered medications on file prior to visit.    Review of Systems  Constitutional: Negative for activity change, appetite change, fatigue, fever and unexpected weight  change.  HENT: Negative for congestion, rhinorrhea, sore throat and trouble swallowing.   Eyes: Negative for pain, redness, itching and visual disturbance.  Respiratory: Negative for cough, chest tightness, shortness of breath and wheezing.   Cardiovascular: Negative for chest pain and palpitations.  Gastrointestinal: Negative for abdominal pain, blood in stool, constipation, diarrhea and nausea.  Endocrine: Negative for cold intolerance, heat intolerance, polydipsia and polyuria.  Genitourinary: Negative for difficulty urinating, dysuria, frequency and urgency.  Musculoskeletal: Negative for arthralgias, joint swelling and myalgias.  Skin: Negative for pallor and rash.  Neurological: Negative for dizziness, tremors, weakness, numbness and headaches.  Hematological: Negative for adenopathy. Does not bruise/bleed easily.  Psychiatric/Behavioral: Positive for decreased concentration and dysphoric mood. Negative for confusion and suicidal ideas. The patient is nervous/anxious.        Objective:   Physical Exam Constitutional:      General: He is not in acute distress.    Appearance: Normal appearance. He is normal weight. He is not ill-appearing.  Eyes:     Conjunctiva/sclera: Conjunctivae normal.     Pupils: Pupils are equal, round, and reactive to light.  Cardiovascular:     Rate and Rhythm: Regular rhythm. Bradycardia present.  Pulmonary:     Effort: Pulmonary effort is normal. No respiratory distress.  Skin:    Coloration: Skin is not pale.  Neurological:     Mental Status: He is alert.     Cranial Nerves: No cranial nerve deficit.     Motor: No tremor.     Coordination: Coordination normal.  Psychiatric:        Attention and Perception: Attention normal.        Mood and Affect: Affect is not flat or tearful.        Speech: Speech normal.        Behavior: Behavior normal.        Cognition and Memory: Cognition and memory normal.     Comments: Pt talks candidly about symptoms   Not tearful or overly anxious today             Assessment & Plan:   Problem List Items Addressed This Visit      Other   Anxiety and depression - Primary    lexapro 10 caused irritability and inc appetite 5 mg -not helpful  Reviewed stressors/ coping techniques/symptoms/ support sources/ tx options and side effects in detail today Will try buspar 7.5 mg bid to see if this helps more re: anxiety  Disc self care, enc more outdoor time and exercise  Discussed  expectations of SSRI medication including time to effectiveness and mechanism of action, also poss of side effects (early and late)- including mental fuzziness, weight or appetite change, nausea and poss of worse dep or anxiety (even suicidal thoughts)  Pt voiced understanding and will stop med and update if this occurs   Inst pt to update [place] 1-2 mo and if helpful will continue or titrate the buspar      Relevant Medications   busPIRone (BUSPAR) 15 MG tablet

## 2021-01-28 ENCOUNTER — Encounter: Payer: Self-pay | Admitting: Family Medicine

## 2021-02-07 ENCOUNTER — Other Ambulatory Visit: Payer: Self-pay | Admitting: Cardiovascular Disease

## 2021-02-07 ENCOUNTER — Other Ambulatory Visit: Payer: Self-pay | Admitting: Urology

## 2021-02-07 DIAGNOSIS — M79602 Pain in left arm: Secondary | ICD-10-CM

## 2021-02-07 DIAGNOSIS — B009 Herpesviral infection, unspecified: Secondary | ICD-10-CM

## 2021-02-10 ENCOUNTER — Encounter: Payer: Self-pay | Admitting: Family Medicine

## 2021-02-11 NOTE — Telephone Encounter (Signed)
Form in your inbox 

## 2021-02-22 ENCOUNTER — Other Ambulatory Visit: Payer: Self-pay | Admitting: Cardiovascular Disease

## 2021-02-27 ENCOUNTER — Telehealth: Payer: Self-pay | Admitting: Urology

## 2021-02-27 NOTE — Telephone Encounter (Signed)
Patient called the office today with complaint that his labwork on 10/17/2020 was not filed to his insurance.   I called [facility] patient billing [phone] and spoke to [employer].  I provided the patient's [facility] Medicare insurance information.  She added the insurance and will refile the claim.  She said to allow 30-45 days for the insurance to respond.  Patient should disregard the invoice received and should receive an updated EOB.  Patient advised and expressed understanding.

## 2021-04-16 ENCOUNTER — Other Ambulatory Visit: Payer: Self-pay | Admitting: Cardiovascular Disease

## 2021-04-16 NOTE — Telephone Encounter (Signed)
Pt's partner called the office to schedule a lab appt only, per pt if his PSA is normal he does not need and OV. Please advise

## 2021-04-17 NOTE — Progress Notes (Signed)
04/18/2021 12:06 PM   Charles Holder 1966/05/22 [id number]  Referring provider: Abner Greenspan, MD [address] [place],  [place] [address]  Urological history: 1. Prostate cancer -PSA pending -low risk cT1c Gleason 3+3 prostate cancer, iPSA 5.4 dx 11/17 s/p recent confirmatory prostate biopsy 09/2017  -ProMark proteomic prognostic test on his most recent prostate biopsy specimen.  His pro [name] score was 41 which predicts a 24% chance of aggressive disease.  "Aggressive disease" is defined as unfavorable pathology in your prostate, P T3a are T3b disease, or nodal or distant metastases.  On the study, 24% risk is an 11% reduction of the risk predicted from biopsy alone -Dx 08/2016 Low Risk Prostate Cancer T1c, PSA 5.4, prostate 46 grams Gleason 3+3=6, 4 cores, all left mid and left apex, 57-83 %; +PNI in left mid medial core  -Repeat bx 09/24/17 T1c, PSA 3.6, TRUS vol 37.7 Gleason 3+3, 7/12 cores, primarily on left, 77-87% (negligible 1% in single isolated core right), positive perineural invasion -prostate MRI PI-RADS category 3 lesions in the mid gland and apex bilaterally 2021- refused repeat biopsy   2. BPH with LU TS -I PSS 4/3 -PVR 72 mL  3. Herpes simplex -managed with 1/2 tablet of Valtrex 1000 mg QD  Chief Complaint  Patient presents with   Prostate Cancer     HPI: Charles Holder is a 55 y.o. male who presents today for 6 month follow up.    He is experiencing nocturia x 1-3.  Patient denies any modifying or aggravating factors.  Patient denies any gross hematuria, dysuria or suprapubic/flank pain.  Patient denies any fevers, chills, nausea or vomiting.       IPSS     Row Name 04/18/21 1100         International Prostate Symptom Score   How often have you had the sensation of not emptying your bladder? Not at All     How often have you had to urinate less than every two hours? Less than 1 in 5 times     How often have you found you stopped and  started again several times when you urinated? Not at All     How often have you found it difficult to postpone urination? Less than 1 in 5 times     How often have you had a weak urinary stream? Less than 1 in 5 times     How often have you had to strain to start urination? Not at All     How many times did you typically get up at night to urinate? 1 Time     Total IPSS Score 4           Quality of Life due to urinary symptoms     If you were to spend the rest of your life with your urinary condition just the way it is now how would you feel about that? Mixed              Score:  1-7 Mild 8-19 Moderate 20-35 Severe  PMH: Past Medical History:  Diagnosis Date   Adopted    Anxiety    Atrial fibrillation (HCC)    BPH (benign prostatic hyperplasia)    Cardiomyopathy, ischemic 01/22/2011   EF 50%   Chronic lower back pain    s/p L5-S1   Coronary artery disease    Depression    ED (erectile dysfunction)    Fatigue    Headache  Heart attack ([place])    Herpes genitalia    Hyperlipidemia    Hypertension    Hypogonadism in male    Neuropathy    Obesity    Prostate cancer ([place])    SOB (shortness of breath)     Surgical History: Past Surgical History:  Procedure Laterality Date   back surgeries     x 4   CORONARY ANGIOPLASTY  01/22/2011   stent    Home Medications:  Allergies as of 04/18/2021       Reactions   Codeine    Makes me sick         Medication List        Accurate as of April 18, 2021 12:06 PM. If you have any questions, ask your nurse or doctor.          amLODipine 10 MG tablet Commonly known as: NORVASC TAKE 1 TABLET BY MOUTH DAILY   aspirin 81 MG EC tablet Take 81 mg by mouth daily.   busPIRone 15 MG tablet Commonly known as: BUSPAR Take 1 tablet (15 mg total) by mouth 2 (two) times daily.   clopidogrel 75 MG tablet Commonly known as: PLAVIX TAKE 1 TABLET BY MOUTH DAILY   ezetimibe 10 MG tablet Commonly known as: ZETIA TAKE  ONE TABLET EVERY DAY   Fish Oil 1000 MG Caps Take by mouth.   MULTIVITAMIN ADULT PO Take by mouth.   nitroGLYCERIN 0.4 MG SL tablet Commonly known as: NITROSTAT PLACE 1 TABLET UNDER TONGUE EVERY 5 MIN AS NEEDED FOR CHEST PAIN IF NO RELIEF IN15 MIN CALL 911 (MAX 3 TABS)   PROBIOTIC ADVANCED PO Take by mouth.   rosuvastatin 5 MG tablet Commonly known as: CRESTOR TAKE 1 TABLET BY MOUTH DAILY   valACYclovir 1000 MG tablet Commonly known as: VALTREX Take 1 tablet (1,000 mg total) by mouth daily.        Allergies:  Allergies  Allergen Reactions   Codeine     Makes me sick     Family History: Family History  Adopted: Yes  Problem Relation Age of Onset   Prostate cancer Neg Hx     Social History:  reports that he has been smoking cigarettes. He has been smoking an average of 1.00 packs per day. He has never used smokeless tobacco. He reports current drug use. Drug: Marijuana. He reports that he does not drink alcohol.  ROS: For pertinent review of systems please refer to history of present illness  Physical Exam: BP (!) 160/87   Pulse 66   Ht 5\' 9"  (1.753 m)   Wt 189 lb (85.7 kg)   BMI 27.91 kg/m   Constitutional:  Well nourished. Alert and oriented, No acute distress. HEENT: [place] AT, mask in place.  Trachea midline Cardiovascular: No clubbing, cyanosis, or edema. Respiratory: Normal respiratory effort, no increased work of breathing. GU: No CVA tenderness.  No bladder fullness or masses.  Patient with uncircumcised phallus.  Foreskin easily retracted  Urethral meatus is patent.  No penile discharge. No penile lesions or rashes. Scrotum without lesions, cysts, rashes and/or edema.  Testicles are located scrotally bilaterally. No masses are appreciated in the testicles. Left and right epididymis are normal. Rectal: Patient with  normal sphincter tone. Anus and perineum without scarring or rashes. No rectal masses are appreciated. Prostate is approximately 45 grams, no  nodules are appreciated. Seminal vesicles could not be palpated Neurologic: Grossly intact, no focal deficits, moving all 4 extremities. Psychiatric: Normal mood and  affect.   Laboratory Data: No recent labs   Pertinent Imaging: Results for Charles Holder, Charles Holder (MRN [id number]) as of 04/18/2021 11:59  Ref. Range 04/18/2021 11:39  Scan Result Unknown 72 ml     Assessment & Plan:    1. Cancer of prostate with low recurrence risk (stage T1-2a and Gleason < 7 and PSA < 10) (HCC) -PSA pending   2. BPH with obstruction/lower urinary tract symptoms -Continue conservative management  3. Herpes simplex -Refill of Valtrex given   Return in about 6 months (around 10/19/2021) for IPSS, PSA and exam.  Zara Council, [facility]  [place] [address] [place], [place] [address] [phone]

## 2021-04-18 ENCOUNTER — Other Ambulatory Visit: Payer: Self-pay

## 2021-04-18 ENCOUNTER — Ambulatory Visit: Payer: Medicare Other | Admitting: Urology

## 2021-04-18 ENCOUNTER — Encounter: Payer: Self-pay | Admitting: Urology

## 2021-04-18 VITALS — BP 160/87 | HR 66 | Ht 69.0 in | Wt 189.0 lb

## 2021-04-18 DIAGNOSIS — N138 Other obstructive and reflux uropathy: Secondary | ICD-10-CM | POA: Diagnosis not present

## 2021-04-18 DIAGNOSIS — N401 Enlarged prostate with lower urinary tract symptoms: Secondary | ICD-10-CM

## 2021-04-18 DIAGNOSIS — B009 Herpesviral infection, unspecified: Secondary | ICD-10-CM

## 2021-04-18 DIAGNOSIS — C61 Malignant neoplasm of prostate: Secondary | ICD-10-CM

## 2021-04-18 LAB — BLADDER SCAN AMB NON-IMAGING: Scan Result: 72

## 2021-04-18 MED ORDER — VALACYCLOVIR HCL 1 G PO TABS: 1000.0000 mg | ORAL_TABLET | Freq: Every day | ORAL | 3 refills | Status: DC

## 2021-04-19 LAB — PSA: Prostate Specific Ag, Serum: 4.7 ng/mL — ABNORMAL HIGH (ref 0.0–4.0)

## 2021-05-09 ENCOUNTER — Telehealth: Payer: Self-pay | Admitting: Family Medicine

## 2021-05-09 DIAGNOSIS — E782 Mixed hyperlipidemia: Secondary | ICD-10-CM

## 2021-05-09 DIAGNOSIS — I10 Essential (primary) hypertension: Secondary | ICD-10-CM

## 2021-05-09 NOTE — Telephone Encounter (Signed)
-----   Message from Ellamae Sia sent at 04/22/2021  9:19 AM EDT ----- Regarding: Lab or[facility] for Friday, 7.29.22  AWV lab orders, please.

## 2021-05-10 ENCOUNTER — Other Ambulatory Visit: Payer: Self-pay

## 2021-05-10 ENCOUNTER — Other Ambulatory Visit (INDEPENDENT_AMBULATORY_CARE_PROVIDER_SITE_OTHER): Payer: Medicare Other

## 2021-05-10 ENCOUNTER — Ambulatory Visit (INDEPENDENT_AMBULATORY_CARE_PROVIDER_SITE_OTHER): Payer: Medicare Other

## 2021-05-10 DIAGNOSIS — E782 Mixed hyperlipidemia: Secondary | ICD-10-CM | POA: Diagnosis not present

## 2021-05-10 DIAGNOSIS — Z0000 Encounter for general adult medical examination without abnormal findings: Secondary | ICD-10-CM | POA: Diagnosis not present

## 2021-05-10 DIAGNOSIS — I10 Essential (primary) hypertension: Secondary | ICD-10-CM | POA: Diagnosis not present

## 2021-05-10 LAB — COMPREHENSIVE METABOLIC PANEL
ALT: 22 U/L (ref 0–53)
AST: 18 U/L (ref 0–37)
Albumin: 3.9 g/dL (ref 3.5–5.2)
Alkaline Phosphatase: 65 U/L (ref 39–117)
BUN: 13 mg/dL (ref 6–23)
CO2: 28 mEq/L (ref 19–32)
Calcium: 9.3 mg/dL (ref 8.4–10.5)
Chloride: 105 mEq/L (ref 96–112)
Creatinine, Ser: 1.17 mg/dL (ref 0.40–1.50)
GFR: 69.6 mL/min (ref >60.00)
Glucose, Bld: 87 mg/dL (ref 70–99)
Potassium: 4.2 mEq/L (ref 3.5–5.1)
Sodium: 140 mEq/L (ref 135–145)
Total Bilirubin: 0.5 mg/dL (ref 0.2–1.2)
Total Protein: 6.2 g/dL (ref 6.0–8.3)

## 2021-05-10 LAB — CBC WITH DIFFERENTIAL/PLATELET
Basophils Absolute: 0.1 10*3/uL (ref 0.0–0.1)
Basophils Relative: 0.6 % (ref 0.0–3.0)
Eosinophils Absolute: 0.4 10*3/uL (ref 0.0–0.7)
Eosinophils Relative: 2.8 % (ref 0.0–5.0)
HCT: 45.1 % (ref 39.0–52.0)
Hemoglobin: 15.4 g/dL (ref 13.0–17.0)
Lymphocytes Relative: 24.1 % (ref 12.0–46.0)
Lymphs Abs: 3.3 10*3/uL (ref 0.7–4.0)
MCHC: 34.2 g/dL (ref 30.0–36.0)
MCV: 88.5 fl (ref 78.0–100.0)
Monocytes Absolute: 1.1 10*3/uL — ABNORMAL HIGH (ref 0.1–1.0)
Monocytes Relative: 7.9 % (ref 3.0–12.0)
Neutro Abs: 9 10*3/uL — ABNORMAL HIGH (ref 1.4–7.7)
Neutrophils Relative %: 64.6 % (ref 43.0–77.0)
Platelets: 242 10*3/uL (ref 150.0–400.0)
RBC: 5.1 Mil/uL (ref 4.22–5.81)
RDW: 14.4 % (ref 11.5–15.5)
WBC: 13.9 10*3/uL — ABNORMAL HIGH (ref 4.0–10.5)

## 2021-05-10 LAB — LIPID PANEL
Cholesterol: 100 mg/dL (ref 0–200)
HDL: 31.3 mg/dL — ABNORMAL LOW (ref >39.00)
LDL Cholesterol: 51 mg/dL (ref 0–99)
NonHDL: 68.88
Total CHOL/HDL Ratio: 3
Triglycerides: 89 mg/dL (ref 0.0–149.0)
VLDL: 17.8 mg/dL (ref 0.0–40.0)

## 2021-05-10 LAB — TSH: TSH: 1.8 u[IU]/mL (ref 0.35–5.50)

## 2021-05-10 NOTE — Progress Notes (Signed)
Subjective:   Charles Holder is a 55 y.o. male who presents for Medicare Annual/Subsequent preventive examination.  Review of Systems: N/A      I connected with the patient today by telephone and verified that I am speaking with the correct person using two identifiers. Location patient: home Location nurse: work Persons participating in the telephone visit: patient, nurse.   I discussed the limitations, risks, security and privacy concerns of performing an evaluation and management service by telephone and the availability of in person appointments. I also discussed with the patient that there may be a patient responsible charge related to this service. The patient expressed understanding and verbally consented to this telephonic visit.        Cardiac Risk Factors include: advanced age (>57mn, >>42women);male gender;hypertension;Other (see comment), Risk factor comments: hyperlipidemia     Objective:    Today's Vitals   There is no height or weight on file to calculate BMI.  Advanced Directives 05/10/2021 09/18/2016  Does Patient Have a Medical Advance Directive? No No  Would patient like information on creating a medical advance directive? No - Patient declined No - Patient declined    Current Medications (verified) Outpatient Encounter Medications as of 05/10/2021  Medication Sig   amLODipine (NORVASC) 10 MG tablet TAKE 1 TABLET BY MOUTH DAILY   aspirin 81 MG EC tablet Take 81 mg by mouth daily.   busPIRone (BUSPAR) 15 MG tablet Take 1 tablet (15 mg total) by mouth 2 (two) times daily.   clopidogrel (PLAVIX) 75 MG tablet TAKE 1 TABLET BY MOUTH DAILY   ezetimibe (ZETIA) 10 MG tablet TAKE ONE TABLET EVERY DAY   Multiple Vitamins-Minerals (MULTIVITAMIN ADULT PO) Take by mouth.   nitroGLYCERIN (NITROSTAT) 0.4 MG SL tablet PLACE 1 TABLET UNDER TONGUE EVERY 5 MIN AS NEEDED FOR CHEST PAIN IF NO RELIEF IN15 MIN CALL 911 (MAX 3 TABS)   Omega-3 Fatty Acids (FISH OIL) 1000 MG CAPS  Take by mouth.   Probiotic Product (PROBIOTIC ADVANCED PO) Take by mouth.   rosuvastatin (CRESTOR) 5 MG tablet TAKE 1 TABLET BY MOUTH DAILY   valACYclovir (VALTREX) 1000 MG tablet Take 1 tablet (1,000 mg total) by mouth daily.   No facility-administered encounter medications on file as of 05/10/2021.    Allergies (verified) Codeine   History: Past Medical History:  Diagnosis Date   Adopted    Anxiety    Atrial fibrillation (HCC)    BPH (benign prostatic hyperplasia)    Cardiomyopathy, ischemic 01/22/2011   EF 50%   Chronic lower back pain    s/p L5-S1   Coronary artery disease    Depression    ED (erectile dysfunction)    Fatigue    Headache    [place]    Herpes genitalia    Hyperlipidemia    Hypertension    Hypogonadism in male    Neuropathy    Obesity    Prostate cancer ([place]    SOB (shortness of breath)    Past Surgical History:  Procedure Laterality Date   back surgeries     x 4   CORONARY ANGIOPLASTY  01/22/2011   stent   Family History  Adopted: Yes  Problem Relation Age of Onset   Prostate cancer Neg Hx    Social History   Socioeconomic History   Marital status: Single    Spouse name: Not on file   Number of children: Not on file   Years of education: Not on  file   Highest education level: Not on file  Occupational History   Not on file  Tobacco Use   Smoking status: Every Day    Packs/day: 1.00    Types: Cigarettes   Smokeless tobacco: Never  Vaping Use   Vaping Use: Former  Substance and Sexual Activity   Alcohol use: No   Drug use: Yes    Types: Marijuana   Sexual activity: Yes    Birth control/protection: None  Other Topics Concern   Not on file  Social History Narrative   Not on file   Social Determinants of Health   Financial Resource Strain: Low Risk    Difficulty of Paying Living Expenses: Not hard at all  Food Insecurity: No Food Insecurity   Worried About Charity fundraiser in the Last Year: Never true   [place] in the Last Year: Never true  Transportation Needs: No Transportation Needs   Lack of Transportation (Medical): No   Lack of Transportation (Non-Medical): No  Physical Activity: Inactive   Days of Exercise per Week: 0 days   Minutes of Exercise per Session: 0 min  Stress: No Stress Concern Present   Feeling of Stress : Not at all  Social Connections: Not on file    Tobacco Counseling Ready to quit: Not Answered Counseling given: Not Answered   Clinical Intake:  Pre-visit preparation completed: Yes  Pain : No/denies pain     Nutritional Risks: None Diabetes: No  How often do you need to have someone help you when you read instructions, pamphlets, or other written materials from your doctor or pharmacy?: 1 - Never  Diabetic: No Nutrition Risk Assessment:  Has the patient had any N/V/D within the last 2 months?  No  Does the patient have any non-healing wounds?  No  Has the patient had any unintentional weight loss or weight gain?  No   Diabetes:  Is the patient diabetic?  No  If diabetic, was a CBG obtained today?   N/A Did the patient bring in their glucometer from home?   N/A How often do you monitor your CBG's? N/A.   Financial Strains and Diabetes Management:  Are you having any financial strains with the device, your supplies or your medication?  N/A .  Does the patient want to be seen by Chronic Care Management for management of their diabetes?   N/A Would the patient like to be referred to a Nutritionist or for Diabetic Management?   N/A    Interpreter Needed?: No  Information entered by :: CJohnson, RN   Activities of Daily Living In your present state of health, do you have any difficulty performing the following activities: 05/10/2021  Hearing? N  Vision? N  Difficulty concentrating or making decisions? N  Walking or climbing stairs? N  Dressing or bathing? N  Doing errands, shopping? N  Preparing Food and eating ? N  Using the  Toilet? N  In the past six months, have you accidently leaked urine? N  Do you have problems with loss of bowel control? N  Managing your Medications? N  Managing your Finances? N  Housekeeping or managing your Housekeeping? N  Some recent data might be hidden    Patient Care Team: Tower, Wynelle Fanny, MD as PCP - General Rockey Situ, Kathlene November, MD as PCP - Cardiology (Cardiology) Minna Merritts, MD as Consulting Physician (Cardiology)  Indicate any recent Medical Services you may have received from other than [facility]  providers in the past year (date may be approximate).     Assessment:   This is a routine wellness examination for Joahan.  Hearing/Vision screen Vision Screening - Comments:: Patient gets annual eye exams   Dietary issues and exercise activities discussed: Current Exercise Habits: The patient does not participate in regular exercise at present, Exercise limited by: None identified   Goals Addressed             This Visit's Progress    Patient Stated       05/10/2021, I will maintain and continue medications as prescribed.        Depression Screen PHQ 2/9 Scores 05/10/2021 11/26/2020 12/16/2017  PHQ - 2 Score 4 1 0  PHQ- 9 Score 4 10 -    Fall Risk Fall Risk  05/10/2021  Falls in the past year? 0  Number falls in past yr: 0  Injury with Fall? 0  Risk for fall due to : Medication side effect  Follow up Falls evaluation completed;Falls prevention discussed    FALL RISK PREVENTION PERTAINING TO THE HOME:  Any stairs in or around the home? Yes  If so, are there any without handrails? No  Home free of loose throw rugs in walkways, pet beds, electrical cords, etc? Yes  Adequate lighting in your home to reduce risk of falls? Yes   ASSISTIVE DEVICES UTILIZED TO PREVENT FALLS:  Life alert? No  Use of a cane, walker or w/c? No  Grab bars in the bathroom? No  Shower chair or bench in shower? No  Elevated toilet seat or a handicapped toilet? No   TIMED UP AND  GO:  Was the test performed?  N/A telephone visit .   Cognitive Function: MMSE - Mini Mental State Exam 05/10/2021  Not completed: Refused       Mini Cog  Mini-Cog screen was not completed. Refused. Maximum score is 22. A value of 0 denotes this part of the MMSE was not completed or the patient failed this part of the Mini-Cog screening.  Immunizations Immunization History  Administered Date(s) Administered   Td 03/23/2007   Tdap 12/16/2017    TDAP status: Up to date  Flu Vaccine status: due Fall 2022  Pneumococcal vaccine status: due at age 30   Covid-19 vaccine status: Declined, Education has been provided regarding the importance of this vaccine but patient still declined. Advised may receive this vaccine at local pharmacy or Health Dept.or vaccine clinic. Aware to provide a copy of the vaccination record if obtained from local pharmacy or Health Dept. Verbalized acceptance and understanding.  Qualifies for Shingles Vaccine? Yes   Zostavax completed No   Shingrix Completed?: No.    Education has been provided regarding the importance of this vaccine. Patient has been advised to call insurance company to determine out of pocket expense if they have not yet received this vaccine. Advised may also receive vaccine at local pharmacy or Health Dept. Verbalized acceptance and understanding.  Screening Tests Health Maintenance  Topic Date Due   Pneumococcal Vaccine 69-47 Years old (1 - PCV) Never done   Hepatitis C Screening  Never done   Zoster Vaccines- Shingrix (1 of 2) Never done   INFLUENZA VACCINE  05/13/2021   TETANUS/TDAP  12/17/2027   COLONOSCOPY (Pts 45-91yr Insurance coverage will need to be confirmed)  01/14/2028   HIV Screening  Completed   HPV VACCINES  Aged Out   COVID-19 Vaccine  Discontinued    Health Maintenance  Health  Maintenance Due  Topic Date Due   Pneumococcal Vaccine 25-75 Years old (1 - PCV) Never done   Hepatitis C Screening  Never done    Zoster Vaccines- Shingrix (1 of 2) Never done    Colorectal cancer screening: Type of screening: Colonoscopy. Completed 01/13/2018. Repeat every 10 years  Lung Cancer Screening: (Low Dose CT Chest recommended if Age 60-80 years, 30 pack-year currently smoking OR have quit w/in 15 years.) does qualify.   Lung Cancer Screening Referral: due, will discuss with provider   Additional Screening:  Hepatitis C Screening: does qualify; Completed due  Vision Screening: Recommended annual ophthalmology exams for early detection of glaucoma and other disorders of the eye. Is the patient up to date with their annual eye exam?  Yes  Who is the provider or what is the name of the office in which the patient attends annual eye exams? [facility] If pt is not established with a provider, would they like to be referred to a provider to establish care? No .   Dental Screening: Recommended annual dental exams for proper oral hygiene  Community Resource Referral / Chronic Care Management: CRR required this visit?  No   CCM required this visit?  No      Plan:     I have personally reviewed and noted the following in the patient's chart:   Medical and social history Use of alcohol, tobacco or illicit drugs  Current medications and supplements including opioid prescriptions. Patient is not currently taking opioid prescriptions. Functional ability and status Nutritional status Physical activity Advanced directives List of other physicians Hospitalizations, surgeries, and ER visits in previous 12 months Vitals Screenings to include cognitive, depression, and falls Referrals and appointments  In addition, I have reviewed and discussed with patient certain preventive protocols, quality metrics, and best practice recommendations. A written personalized care plan for preventive services as well as general preventive health recommendations were provided to patient.   Due to this being a  telephonic visit, the after visit summary with patients personalized plan was offered to patient via office or my-chart. Patient preferred to pick up at office at next visit or via mychart.   Andrez Grime, LPN   [id number]

## 2021-05-10 NOTE — Patient Instructions (Signed)
Charles Holder , Thank you for taking time to come for your Medicare Wellness Visit. I appreciate your ongoing commitment to your health goals. Please review the following plan we discussed and let me know if I can assist you in the future.   Screening recommendations/referrals: Colonoscopy: Up to date, completed 01/13/2018, due 01/2028 Recommended yearly ophthalmology/optometry visit for glaucoma screening and checkup Recommended yearly dental visit for hygiene and checkup  Vaccinations: Influenza vaccine: due Fall 2022  Pneumococcal vaccine: due at age 57 Tdap vaccine: Up to date, completed 12/16/2017, due 12/2027 Shingles vaccine: due, check with your insurance regarding coverage if interested  Covid-19: declined  Advanced directives: Advance directive discussed with you today. Even though you declined this today please call our office should you change your mind and we can give you the proper paperwork for you to fill out.  Conditions/risks identified: hypertension, hyperlipidemia   Next appointment: Follow up in one year for your annual wellness visit.   Preventive Care 55 Years Old, Male Male Preventive care refers to lifestyle choices and visits with your health care provider that can promote health and wellness. What does preventive care include? A yearly physical exam. This is also called an annual well check. Dental exams once or twice a year. Routine eye exams. Ask your health care provider how often you should have your eyes checked. Personal lifestyle choices, including: Daily care of your teeth and gums. Regular physical activity. Eating a healthy diet. Avoiding tobacco and drug use. Limiting alcohol use. Practicing safe sex. Taking low doses of aspirin every day. Taking vitamin and mineral supplements as recommended by your health care provider. What happens during an annual well check? The services and screenings done by your health care provider during your annual well check  will depend on your age, overall health, lifestyle risk factors, and family history of disease. Counseling  Your health care provider may ask you questions about your: Alcohol use. Tobacco use. Drug use. Emotional well-being. Home and relationship well-being. Sexual activity. Eating habits. History of falls. Memory and ability to understand (cognition). Work and work Statistician. Screening  You may have the following tests or measurements: Height, weight, and BMI. Blood pressure. Lipid and cholesterol levels. These may be checked every 5 years, or more frequently if you are over 30 years old. Skin check. Lung cancer screening. You may have this screening every year starting at age 44 if you have a 30-pack-year history of smoking and currently smoke or have quit within the past 15 years. Fecal occult blood test (FOBT) of the stool. You may have this test every year starting at age 21. Flexible sigmoidoscopy or colonoscopy. You may have a sigmoidoscopy every 5 years or a colonoscopy every 10 years starting at age 10. Prostate cancer screening. Recommendations will vary depending on your family history and other risks. Hepatitis C blood test. Hepatitis B blood test. Sexually transmitted disease (STD) testing. Diabetes screening. This is done by checking your blood sugar (glucose) after you have not eaten for a while (fasting). You may have this done every 1-3 years. Abdominal aortic aneurysm (AAA) screening. You may need this if you are a current or former smoker. Osteoporosis. You may be screened starting at age 10 if you are at high risk. Talk with your health care provider about your test results, treatment options, and if necessary, the need for more tests. Vaccines  Your health care provider may recommend certain vaccines, such as: Influenza vaccine. This is recommended every year. Tetanus, diphtheria,  and acellular pertussis (Tdap, Td) vaccine. You may need a Td booster every 10  years. Zoster vaccine. You may need this after age 46. Pneumococcal 13-valent conjugate (PCV13) vaccine. One dose is recommended after age 57 Pneumococcal polysaccharide (PPSV23) vaccine. One dose is recommended after age 55. Talk to your health care provider about which screenings and vaccines you need and how often you need them. This information is not intended to replace advice given to you by your health care provider. Make sure you discuss any questions you have with your health care provider. Document Released: 10/26/2015 Document Revised: 06/18/2016 Document Reviewed: 07/31/2015 Elsevier Interactive Patient Education  2017 [place] Prevention in the Home Falls can cause injuries. They can happen to people of all ages. There are many things you can do to make your home safe and to help prevent falls. What can I do on the outside of my home? Regularly fix the edges of walkways and driveways and fix any cracks. Remove anything that might make you trip as you walk through a door, such as a raised step or threshold. Trim any bushes or trees on the path to your home. Use bright outdoor lighting. Clear any walking paths of anything that might make someone trip, such as rocks or tools. Regularly check to see if handrails are loose or broken. Make sure that both sides of any steps have handrails. Any raised decks and porches should have guardrails on the edges. Have any leaves, snow, or ice cleared regularly. Use sand or salt on walking paths during winter. Clean up any spills in your garage right away. This includes oil or grease spills. What can I do in the bathroom? Use night lights. Install grab bars by the toilet and in the tub and shower. Do not use towel bars as grab bars. Use non-skid mats or decals in the tub or shower. If you need to sit down in the shower, use a plastic, non-slip stool. Keep the floor dry. Clean up any water that spills on the floor as soon as it  happens. Remove soap buildup in the tub or shower regularly. Attach bath mats securely with double-sided non-slip rug tape. Do not have throw rugs and other things on the floor that can make you trip. What can I do in the bedroom? Use night lights. Make sure that you have a light by your bed that is easy to reach. Do not use any sheets or blankets that are too big for your bed. They should not hang down onto the floor. Have a firm chair that has side arms. You can use this for support while you get dressed. Do not have throw rugs and other things on the floor that can make you trip. What can I do in the kitchen? Clean up any spills right away. Avoid walking on wet floors. Keep items that you use a lot in easy-to-reach places. If you need to reach something above you, use a strong step stool that has a grab bar. Keep electrical cords out of the way. Do not use floor polish or wax that makes floors slippery. If you must use wax, use non-skid floor wax. Do not have throw rugs and other things on the floor that can make you trip. What can I do with my stairs? Do not leave any items on the stairs. Make sure that there are handrails on both sides of the stairs and use them. Fix handrails that are broken or loose. Make sure  that handrails are as long as the stairways. Check any carpeting to make sure that it is firmly attached to the stairs. Fix any carpet that is loose or worn. Avoid having throw rugs at the top or bottom of the stairs. If you do have throw rugs, attach them to the floor with carpet tape. Make sure that you have a light switch at the top of the stairs and the bottom of the stairs. If you do not have them, ask someone to add them for you. What else can I do to help prevent falls? Wear shoes that: Do not have high heels. Have rubber bottoms. Are comfortable and fit you well. Are closed at the toe. Do not wear sandals. If you use a stepladder: Make sure that it is fully opened.  Do not climb a closed stepladder. Make sure that both sides of the stepladder are locked into place. Ask someone to hold it for you, if possible. Clearly mark and make sure that you can see: Any grab bars or handrails. First and last steps. Where the edge of each step is. Use tools that help you move around (mobility aids) if they are needed. These include: Canes. Walkers. Scooters. Crutches. Turn on the lights when you go into a dark area. Replace any light bulbs as soon as they burn out. Set up your furniture so you have a clear path. Avoid moving your furniture around. If any of your floors are uneven, fix them. If there are any pets around you, be aware of where they are. Review your medicines with your doctor. Some medicines can make you feel dizzy. This can increase your chance of falling. Ask your doctor what other things that you can do to help prevent falls. This information is not intended to replace advice given to you by your health care provider. Make sure you discuss any questions you have with your health care provider. Document Released: 07/26/2009 Document Revised: 03/06/2016 Document Reviewed: 11/03/2014 Elsevier Interactive Patient Education  2017 [employer].

## 2021-05-10 NOTE — Progress Notes (Signed)
PCP notes:  Health Maintenance: Shingrix- due Low dose CT screening- due    Abnormal Screenings: none   Patient concerns: none   Nurse concerns: none   Next PCP appt.: 05/14/2021 @ 9 am
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
## 2021-05-13 ENCOUNTER — Other Ambulatory Visit: Payer: Self-pay

## 2021-05-13 ENCOUNTER — Encounter: Payer: Self-pay | Admitting: Cardiovascular Disease

## 2021-05-13 ENCOUNTER — Ambulatory Visit: Payer: Medicare Other | Admitting: Cardiovascular Disease

## 2021-05-13 VITALS — BP 120/72 | HR 60 | Ht 69.0 in | Wt 193.5 lb

## 2021-05-13 DIAGNOSIS — E7800 Pure hypercholesterolemia, unspecified: Secondary | ICD-10-CM

## 2021-05-13 DIAGNOSIS — I10 Essential (primary) hypertension: Secondary | ICD-10-CM | POA: Diagnosis not present

## 2021-05-13 DIAGNOSIS — I25118 Atherosclerotic heart disease of native coronary artery with other forms of angina pectoris: Secondary | ICD-10-CM

## 2021-05-13 DIAGNOSIS — M79602 Pain in left arm: Secondary | ICD-10-CM

## 2021-05-13 DIAGNOSIS — F17200 Nicotine dependence, unspecified, uncomplicated: Secondary | ICD-10-CM

## 2021-05-13 DIAGNOSIS — R079 Chest pain, unspecified: Secondary | ICD-10-CM

## 2021-05-13 MED ORDER — AMLODIPINE BESYLATE 5 MG PO TABS: 5.0000 mg | ORAL_TABLET | Freq: Every day | ORAL | 3 refills | Status: DC

## 2021-05-13 MED ORDER — CLOPIDOGREL BISULFATE 75 MG PO TABS
75.0000 mg | ORAL_TABLET | Freq: Every day | ORAL | 3 refills | Status: DC
Start: 2021-05-13 — End: 2022-06-11

## 2021-05-13 NOTE — Patient Instructions (Addendum)
Medication Instructions:  Please decrease the amlodipine down to 5 mg daily  If you need a refill on your cardiac medications before your next appointment, please call your pharmacy.   Lab work: No new labs needed  Testing/Procedures: No new testing needed  Follow-Up: At [facility], you and your health needs are our priority.  As part of our continuing mission to provide you with exceptional heart care, we have created designated Provider Care Teams.  These Care Teams include your primary Cardiologist (physician) and Advanced Practice Providers (APPs -  Physician Assistants and Nurse Practitioners) who all work together to provide you with the care you need, when you need it.  You will need a follow up appointment in 12 months  Providers on your designated Care Team:   Murray Hodgkins, NP Christell Faith, PA-C Marrianne Mood, PA-C Cadence Ophir, [place]  COVID-19 Vaccine Information can be found at: [email or web address].uk For questions related to vaccine distribution or appointments, please email [email or web address] or call [phone].

## 2021-05-14 ENCOUNTER — Other Ambulatory Visit: Payer: Self-pay

## 2021-05-14 ENCOUNTER — Ambulatory Visit (INDEPENDENT_AMBULATORY_CARE_PROVIDER_SITE_OTHER): Payer: Medicare Other | Admitting: Family Medicine

## 2021-05-14 ENCOUNTER — Encounter: Payer: Self-pay | Admitting: Family Medicine

## 2021-05-14 VITALS — BP 118/76 | HR 55 | Temp 97.7°F | Ht 69.0 in | Wt 191.1 lb

## 2021-05-14 DIAGNOSIS — D72829 Elevated white blood cell count, unspecified: Secondary | ICD-10-CM

## 2021-05-14 DIAGNOSIS — F419 Anxiety disorder, unspecified: Secondary | ICD-10-CM | POA: Diagnosis not present

## 2021-05-14 DIAGNOSIS — Z0000 Encounter for general adult medical examination without abnormal findings: Secondary | ICD-10-CM

## 2021-05-14 DIAGNOSIS — F32A Depression, unspecified: Secondary | ICD-10-CM

## 2021-05-14 DIAGNOSIS — E782 Mixed hyperlipidemia: Secondary | ICD-10-CM | POA: Diagnosis not present

## 2021-05-14 DIAGNOSIS — C61 Malignant neoplasm of prostate: Secondary | ICD-10-CM

## 2021-05-14 DIAGNOSIS — F17200 Nicotine dependence, unspecified, uncomplicated: Secondary | ICD-10-CM | POA: Diagnosis not present

## 2021-05-14 DIAGNOSIS — I10 Essential (primary) hypertension: Secondary | ICD-10-CM | POA: Diagnosis not present

## 2021-05-14 NOTE — Assessment & Plan Note (Signed)
May be interested in lung cancer screening program  Will reach out   Disc in detail risks of smoking and possible outcomes including copd, vascular/ heart disease, cancer , respiratory and sinus infections  Pt voices understanding Pt is not ready to quit

## 2021-05-14 NOTE — Patient Instructions (Addendum)
Stay off buspar since it does not work   If you are interested in the shingles vaccine series (Shingrix), call your insurance or pharmacy to check on coverage and location it must be given.  If affordable - you can schedule it here or at your pharmacy depending on coverage   Keep thinking about quitting smoking   Stay active  Wear sunscreen and a hat

## 2021-05-14 NOTE — Assessment & Plan Note (Signed)
Fairly stable with wbc 13.9  Suspect due to smoking  Reactive process favored with last path report

## 2021-05-14 NOTE — Progress Notes (Signed)
Subjective:    Patient ID: Charles Holder, male    DOB: 1966-08-17, 55 y.o.   MRN: [id number]  This visit occurred during the SARS-CoV-2 public health emergency.  Safety protocols were in place, including screening questions prior to the visit, additional usage of staff PPE, and extensive cleaning of exam room while observing appropriate contact time as indicated for disinfecting solutions.   HPI Here for health maintenance exam and to review chronic medical problems    Wt Readings from Last 3 Encounters:  05/14/21 191 lb 1 oz (86.7 kg)  05/13/21 193 lb 8 oz (87.8 kg)  04/18/21 189 lb (85.7 kg)   28.21 kg/m  Eating better  Exercise - yard work/outdoor work as much as tolerated  Chronic back pain  Zoster status -interested in shingrix if covered   Tdap 3/19 Declines covid vaccines   Prostate health-h/o prostate cancer  Sees urology Reviewed note today (psa up a little)  Lab Results  Component Value Date   PSA1 4.7 (H) 04/18/2021   PSA1 2.3 10/17/2020   PSA1 4.9 (H) 04/20/2020  He had been taking otc "prostate pills" and they seem to help  Declines another biopsy at this time-monitoring carefully   Colon cancer screening  Colonoscopy  4/19   Smoking status  Smokes less than 1ppd  Since age 16  Not ready to quit  No lung or breathing problems    HTN (in setting of CAD) bp is stable today  No cp or palpitations or headaches or edema  No side effects to medicines  BP Readings from Last 3 Encounters:  05/14/21 118/76  05/13/21 120/72  04/18/21 (!) 160/87    Amlodipine 5 mg daily -recently cut in 1/2  Sees cardiology   Pulse Readings from Last 3 Encounters:  05/14/21 (!) 55  05/13/21 60  04/18/21 66   H/o anxiety and depression  Taking buspar 15 mg bid  No improvement at all-so stopped it  Lexapro did not help and caued side effects  Wants to go off medication for a bit  Had done counseling   No longer stressed like he was    H/o leukocytosis   Lab Results  Component Value Date   WBC 13.9 (H) 05/10/2021   HGB 15.4 05/10/2021   HCT 45.1 05/10/2021   MCV 88.5 05/10/2021   PLT 242.0 05/10/2021  Suspect due to smoking  Past path rev indicated reactive process   Hyperlipidemia with CAD Lab Results  Component Value Date   CHOL 100 05/10/2021   CHOL 182 04/25/2020   CHOL 149 04/20/2019   Lab Results  Component Value Date   HDL 31.30 (L) 05/10/2021   HDL 30 (L) 04/25/2020   HDL 28 (L) 04/20/2019   Lab Results  Component Value Date   LDLCALC 51 05/10/2021   LDLCALC 130 (H) 04/25/2020   LDLCALC 88 04/20/2019   Lab Results  Component Value Date   TRIG 89.0 05/10/2021   TRIG 118 04/25/2020   TRIG 167 (H) 04/20/2019   Lab Results  Component Value Date   CHOLHDL 3 05/10/2021   CHOLHDL 6.1 (H) 04/25/2020   CHOLHDL 5.3 (H) 04/20/2019   Lab Results  Component Value Date   LDLDIRECT 98 04/20/2019   Takes crestor 5 mg daily and zetia 10 mg daily  Eating better- stays away from fast foods and processed foods   Other labs Lab Results  Component Value Date   CREATININE 1.17 05/10/2021   BUN 13 05/10/2021  NA 140 05/10/2021   K 4.2 05/10/2021   CL 105 05/10/2021   CO2 28 05/10/2021   Lab Results  Component Value Date   ALT 22 05/10/2021   AST 18 05/10/2021   ALKPHOS 65 05/10/2021   BILITOT 0.5 05/10/2021   Lab Results  Component Value Date   TSH 1.80 05/10/2021     Patient Active Problem List   Diagnosis Date Noted   Antiplatelet or antithrombotic long-term use 12/16/2017   Special screening for malignant neoplasms, colon 12/16/2017   Leukocytosis 08/20/2016   Anxiety and depression 08/18/2016   Prostate cancer ([place]) 03/14/2016   Herpes 03/14/2016   Essential hypertension 08/31/2012   CAD (coronary artery disease) 02/07/2011   Stented coronary artery 02/07/2011   Smoking 02/07/2011   Mixed hyperlipidemia 02/07/2011   Chronic back pain 06/05/2008   Past Medical History:  Diagnosis Date    Adopted    Anxiety    Atrial fibrillation ([place])    BPH (benign prostatic hyperplasia)    Cardiomyopathy, ischemic 01/22/2011   EF 50%   Chronic lower back pain    s/p L5-S1   Coronary artery disease    Depression    ED (erectile dysfunction)    Fatigue    Headache    Heart attack ([place])    Herpes genitalia    Hyperlipidemia    Hypertension    Hypogonadism in male    Neuropathy    Obesity    Prostate cancer ([place])    SOB (shortness of breath)    Past Surgical History:  Procedure Laterality Date   back surgeries     x 4   CORONARY ANGIOPLASTY  01/22/2011   stent   Social History   Tobacco Use   Smoking status: Every Day    Packs/day: 1.00    Types: Cigarettes   Smokeless tobacco: Never  Vaping Use   Vaping Use: Former  Substance Use Topics   Alcohol use: No   Drug use: Yes    Types: Marijuana   Family History  Adopted: Yes  Problem Relation Age of Onset   Prostate cancer Neg Hx    Allergies  Allergen Reactions   Codeine     Makes me sick    Current Outpatient Medications on File Prior to Visit  Medication Sig Dispense Refill   amLODipine (NORVASC) 5 MG tablet Take 1 tablet (5 mg total) by mouth daily. (Patient taking differently: Take 2.5 mg by mouth daily.) 90 tablet 3   aspirin 81 MG EC tablet Take 81 mg by mouth daily.     clopidogrel (PLAVIX) 75 MG tablet Take 1 tablet (75 mg total) by mouth daily. 90 tablet 3   ezetimibe (ZETIA) 10 MG tablet TAKE ONE TABLET EVERY DAY 90 tablet 0   Multiple Vitamins-Minerals (MULTIVITAMIN ADULT PO) Take by mouth.     nitroGLYCERIN (NITROSTAT) 0.4 MG SL tablet PLACE 1 TABLET UNDER TONGUE EVERY 5 MIN AS NEEDED FOR CHEST PAIN IF NO RELIEF IN15 MIN CALL 911 (MAX 3 TABS) 25 tablet 0   Omega-3 Fatty Acids (FISH OIL) 1000 MG CAPS Take by mouth.     Probiotic Product (PROBIOTIC ADVANCED PO) Take by mouth.     rosuvastatin (CRESTOR) 5 MG tablet TAKE 1 TABLET BY MOUTH DAILY 90 tablet 1   valACYclovir (VALTREX) 1000 MG tablet Take  1 tablet (1,000 mg total) by mouth daily. 90 tablet 3   No current facility-administered medications on file prior to visit.     Review  of Systems  Constitutional:  Negative for activity change, appetite change, fatigue, fever and unexpected weight change.  HENT:  Negative for congestion, rhinorrhea, sore throat and trouble swallowing.   Eyes:  Negative for pain, redness, itching and visual disturbance.  Respiratory:  Negative for cough, chest tightness, shortness of breath and wheezing.   Cardiovascular:  Negative for chest pain and palpitations.  Gastrointestinal:  Negative for abdominal pain, blood in stool, constipation, diarrhea and nausea.  Endocrine: Negative for cold intolerance, heat intolerance, polydipsia and polyuria.  Genitourinary:  Negative for difficulty urinating, dysuria, frequency and urgency.  Musculoskeletal:  Negative for arthralgias, joint swelling and myalgias.  Skin:  Negative for pallor and rash.       Sees derm soon  Some scabs on head   Neurological:  Negative for dizziness, tremors, weakness, numbness and headaches.  Hematological:  Negative for adenopathy. Does not bruise/bleed easily.  Psychiatric/Behavioral:  Negative for decreased concentration and dysphoric mood. The patient is not nervous/anxious.       Objective:   Physical Exam Constitutional:      General: He is not in acute distress.    Appearance: Normal appearance. He is well-developed and normal weight. He is not ill-appearing or diaphoretic.  HENT:     Head: Normocephalic and atraumatic.     Right Ear: Tympanic membrane, ear canal and external ear normal.     Left Ear: Tympanic membrane, ear canal and external ear normal.     Nose: Nose normal. No congestion.     Mouth/Throat:     Mouth: Mucous membranes are moist.     Pharynx: Oropharynx is clear. No posterior oropharyngeal erythema.  Eyes:     General: No scleral icterus.       Right eye: No discharge.        Left eye: No discharge.      Conjunctiva/sclera: Conjunctivae normal.     Pupils: Pupils are equal, round, and reactive to light.  Neck:     Thyroid: No thyromegaly.     Vascular: No carotid bruit or JVD.  Cardiovascular:     Rate and Rhythm: Normal rate and regular rhythm.     Pulses: Normal pulses.     Heart sounds: Normal heart sounds.    No gallop.  Pulmonary:     Effort: Pulmonary effort is normal. No respiratory distress.     Breath sounds: Normal breath sounds. No wheezing or rales.     Comments: Good air exch Chest:     Chest wall: No tenderness.  Abdominal:     General: Bowel sounds are normal. There is no distension or abdominal bruit.     Palpations: Abdomen is soft. There is no mass.     Tenderness: There is no abdominal tenderness.     Hernia: No hernia is present.  Musculoskeletal:        General: No tenderness.     Cervical back: Normal range of motion and neck supple. No rigidity. No muscular tenderness.     Right lower leg: No edema.     Left lower leg: No edema.  Lymphadenopathy:     Cervical: No cervical adenopathy.  Skin:    General: Skin is warm and dry.     Coloration: Skin is not pale.     Findings: No erythema or rash.     Comments: Tanned Several small scabs on top of head  Solar lentigines diffusely   Neurological:     Mental Status: He is alert.  Cranial Nerves: No cranial nerve deficit.     Motor: No abnormal muscle tone.     Coordination: Coordination normal.     Gait: Gait normal.     Deep Tendon Reflexes: Reflexes are normal and symmetric. Reflexes normal.  Psychiatric:        Mood and Affect: Mood normal.        Cognition and Memory: Cognition and memory normal.     Comments: Mood is good today          Assessment & Plan:   Problem List Items Addressed This Visit       Cardiovascular and Mediastinum   Essential hypertension    bp in fair control at this time  BP Readings from Last 1 Encounters:  05/14/21 118/76  No changes needed Most recent  labs reviewed  Disc lifstyle change with low sodium diet and exercise  Plan to continue amlodipine 2.5 mg daily  Also cardiology care        Genitourinary   Prostate cancer [facility])    [place] urology f/u  Last psa 4.7  Watching closely  Taking a supplement and has declined a recent bx but may change his mind           Other   Smoking    May be interested in lung cancer screening program  Will reach out   Disc in detail risks of smoking and possible outcomes including copd, vascular/ heart disease, cancer , respiratory and sinus infections  Pt voices understanding Pt is not ready to quit        Relevant Orders   Ambulatory Referral for Lung Cancer Scre   Mixed hyperlipidemia    Improved with crestor 5 and zetia 10 mg and better diet  Managed by cardiology  Disc goals for lipids and reasons to control them Rev last labs with pt Rev low sat fat diet in detail  LDL down to 51       Anxiety and depression    Side eff from lexapro  buspar did not help  Would rather go w/o med now  Stress level is improved       Leukocytosis    Fairly stable with wbc 13.9  Suspect due to smoking  Reactive process favored with last path report       Routine general medical examination at a health care facility - Primary    Reviewed health habits including diet and exercise and skin cancer prevention Reviewed appropriate screening tests for age  Also reviewed health mt list, fam hx and immunization status , as well as social and family history   See HPI Labs reviewed  Colonoscopy utd Enc smoking cessation, he is interested in lung cancer screening program (will put in ref)  Sees urology for prostate cancer Declines covid imms Interested in shingrix-will check on coverage  Enc flu shot in the fall

## 2021-05-14 NOTE — Assessment & Plan Note (Signed)
bp in fair control at this time  BP Readings from Last 1 Encounters:  05/14/21 118/76   No changes needed Most recent labs reviewed  Disc lifstyle change with low sodium diet and exercise  Plan to continue amlodipine 2.5 mg daily  Also cardiology care

## 2021-05-14 NOTE — Assessment & Plan Note (Signed)
Side eff from lexapro  buspar did not help  Would rather go w/o med now  Stress level is improved

## 2021-05-14 NOTE — Assessment & Plan Note (Signed)
Continues urology f/u  Last psa 4.7  Watching closely  Taking a supplement and has declined a recent bx but may change his mind

## 2021-05-14 NOTE — Assessment & Plan Note (Signed)
Reviewed health habits including diet and exercise and skin cancer prevention Reviewed appropriate screening tests for age  Also reviewed health mt list, fam hx and immunization status , as well as social and family history   See HPI Labs reviewed  Colonoscopy utd Enc smoking cessation, he is interested in lung cancer screening program (will put in ref)  Sees urology for prostate cancer Declines covid imms Interested in shingrix-will check on coverage  Enc flu shot in the fall

## 2021-05-14 NOTE — Assessment & Plan Note (Signed)
Improved with crestor 5 and zetia 10 mg and better diet  Managed by cardiology  Disc goals for lipids and reasons to control them Rev last labs with pt Rev low sat fat diet in detail  LDL down to 51

## 2021-06-11 DIAGNOSIS — L570 Actinic keratosis: Secondary | ICD-10-CM | POA: Diagnosis not present

## 2021-08-17 ENCOUNTER — Other Ambulatory Visit: Payer: Self-pay | Admitting: Cardiovascular Disease

## 2021-10-15 ENCOUNTER — Ambulatory Visit: Payer: Self-pay | Admitting: Urology

## 2021-10-15 DIAGNOSIS — L570 Actinic keratosis: Secondary | ICD-10-CM | POA: Diagnosis not present

## 2021-10-15 DIAGNOSIS — L723 Sebaceous cyst: Secondary | ICD-10-CM | POA: Diagnosis not present

## 2021-10-16 NOTE — Progress Notes (Signed)
10/17/2021 12:08 PM   Charles Holder Feb 21, 1966 [id number]  Referring provider: Abner Greenspan, MD [address] [place],  [place] [address]   Chief Complaint  Patient presents with   Prostate Cancer   Benign Prostatic Hypertrophy    Urological history: 1. Prostate cancer -PSA pending -low risk cT1c Gleason 3+3 prostate cancer, iPSA 5.4 dx 11/17 s/p recent confirmatory prostate biopsy 09/2017  -ProMark proteomic prognostic test on his most recent prostate biopsy specimen.  His pro [name] score was 41 which predicts a 24% chance of aggressive disease.  "Aggressive disease" is defined as unfavorable pathology in your prostate, P T3a are T3b disease, or nodal or distant metastases.  On the study, 24% risk is an 11% reduction of the risk predicted from biopsy alone -Dx 08/2016 Low Risk Prostate Cancer T1c, PSA 5.4, prostate 46 grams Gleason 3+3=6, 4 cores, all left mid and left apex, 57-83 %; +PNI in left mid medial core  -Repeat bx 09/24/17 T1c, PSA 3.6, TRUS vol 37.7 Gleason 3+3, 7/12 cores, primarily on left, 77-87% (negligible 1% in single isolated core right), positive perineural invasion -prostate MRI PI-RADS category 3 lesions in the mid gland and apex bilaterally 2021- refused repeat biopsy   2. BPH with LU TS -I PSS 7/3  3. Herpes simplex -managed with 1/2 tablet of Valtrex 1000 mg QD  4. ED -Contributing factors of age, prostate cancer, hypertension, coronary artery disease, testosterone deficiency, BPH, HLD, smoking, depression, anxiety, obesity and anticoagulation therapy -SHIM 25   Chief Complaint  Patient presents with   Prostate Cancer   Benign Prostatic Hypertrophy     HPI: Charles Holder is a 56 y.o. male who presents today for 6 month follow up.    His only urinary complaint is nocturia x1-2.  Patient denies any modifying or aggravating factors.  Patient denies any gross hematuria, dysuria or suprapubic/flank pain.  Patient denies any fevers,  chills, nausea or vomiting.     IPSS     Row Name 10/17/21 1100         International Prostate Symptom Score   How often have you had the sensation of not emptying your bladder? Not at All     How often have you had to urinate less than every two hours? Less than 1 in 5 times     How often have you found you stopped and started again several times when you urinated? Less than 1 in 5 times     How often have you found it difficult to postpone urination? Less than half the time     How often have you had a weak urinary stream? Less than 1 in 5 times     How often have you had to strain to start urination? Not at All     How many times did you typically get up at night to urinate? 2 Times     Total IPSS Score 7       Quality of Life due to urinary symptoms   If you were to spend the rest of your life with your urinary condition just the way it is now how would you feel about that? Mixed              Score:  1-7 Mild 8-19 Moderate 20-35 Severe   ] Patient still having spontaneous erections.    He denies any pain or curvature with erections.    SHIM     Row Name 10/17/21 1140  SHIM: Over the last 6 months:   How do you rate your confidence that you could get and keep an erection? Very High     When you had erections with sexual stimulation, how often were your erections hard enough for penetration (entering your partner)? Almost Always or Always     During sexual intercourse, how often were you able to maintain your erection after you had penetrated (entered) your partner? Almost Always or Always     During sexual intercourse, how difficult was it to maintain your erection to completion of intercourse? Not Difficult     When you attempted sexual intercourse, how often was it satisfactory for you? Almost Always or Always       SHIM Total Score   SHIM 25              Score: 1-7 Severe ED 8-11 Moderate ED 12-16 Mild-Moderate ED 17-21 Mild ED 22-25 No ED      IPSS     Row Name 10/17/21 1100         International Prostate Symptom Score   How often have you had the sensation of not emptying your bladder? Not at All     How often have you had to urinate less than every two hours? Less than 1 in 5 times     How often have you found you stopped and started again several times when you urinated? Less than 1 in 5 times     How often have you found it difficult to postpone urination? Less than half the time     How often have you had a weak urinary stream? Less than 1 in 5 times     How often have you had to strain to start urination? Not at All     How many times did you typically get up at night to urinate? 2 Times     Total IPSS Score 7       Quality of Life due to urinary symptoms   If you were to spend the rest of your life with your urinary condition just the way it is now how would you feel about that? Mixed            PMH: Past Medical History:  Diagnosis Date   Adopted    Anxiety    Atrial fibrillation (HCC)    BPH (benign prostatic hyperplasia)    Cardiomyopathy, ischemic 01/22/2011   EF 50%   Chronic lower back pain    s/p L5-S1   Coronary artery disease    Depression    ED (erectile dysfunction)    Fatigue    Headache    Heart attack ([place])    Herpes genitalia    Hyperlipidemia    Hypertension    Hypogonadism in male    Neuropathy    Obesity    Prostate cancer ([place])    SOB (shortness of breath)     Surgical History: Past Surgical History:  Procedure Laterality Date   back surgeries     x 4   CORONARY ANGIOPLASTY  01/22/2011   stent    Home Medications:  Allergies as of 10/17/2021       Reactions   Codeine    Makes me sick         Medication List        Accurate as of October 17, 2021 12:08 PM. If you have any questions, ask your nurse or doctor.  amLODipine 5 MG tablet Commonly known as: NORVASC Take 1 tablet (5 mg total) by mouth daily. What changed: how much to take   aspirin  81 MG EC tablet Take 81 mg by mouth daily.   clopidogrel 75 MG tablet Commonly known as: PLAVIX Take 1 tablet (75 mg total) by mouth daily.   ezetimibe 10 MG tablet Commonly known as: ZETIA TAKE 1 TABLET BY MOUTH DAILY   Fish Oil 1000 MG Caps Take by mouth.   MULTIVITAMIN ADULT PO Take by mouth.   nitroGLYCERIN 0.4 MG SL tablet Commonly known as: NITROSTAT PLACE 1 TABLET UNDER TONGUE EVERY 5 MIN AS NEEDED FOR CHEST PAIN IF NO RELIEF IN15 MIN CALL 911 (MAX 3 TABS)   PROBIOTIC ADVANCED PO Take by mouth.   rosuvastatin 5 MG tablet Commonly known as: CRESTOR TAKE 1 TABLET BY MOUTH DAILY   valACYclovir 1000 MG tablet Commonly known as: VALTREX Take 1 tablet (1,000 mg total) by mouth daily.        Allergies:  Allergies  Allergen Reactions   Codeine     Makes me sick     Family History: Family History  Adopted: Yes  Problem Relation Age of Onset   Prostate cancer Neg Hx     Social History:  reports that he has been smoking cigarettes. He has been smoking an average of 1 pack per day. He has never used smokeless tobacco. He reports current drug use. Drug: Marijuana. He reports that he does not drink alcohol.  ROS: For pertinent review of systems please refer to history of present illness  Physical Exam: BP 121/84    Pulse 74    Ht 5\' 9"  (1.753 m)    Wt 192 lb (87.1 kg)    BMI 28.35 kg/m   Constitutional:  Well nourished. Alert and oriented, No acute distress. HEENT: [place] AT, mask in place.  Trachea midline Cardiovascular: No clubbing, cyanosis, or edema. Respiratory: Normal respiratory effort, no increased work of breathing. GU: No CVA tenderness.  No bladder fullness or masses.  Patient with circumcised/uncircumcised phallus.  Foreskin easily retracted  Urethral meatus is patent.  No penile discharge. No penile lesions or rashes. Scrotum without lesions, cysts, rashes and/or edema.  Testicles are located scrotally bilaterally. No masses are appreciated in the  testicles. Left and right epididymis are normal.  Two small spermatoceles in the right epididymis.  Rectal: Patient with  normal sphincter tone. Anus and perineum without scarring or rashes. No rectal masses are appreciated. Prostate is approximately 45 grams, no nodules are appreciated. Seminal vesicles could not be palpated.  Neurologic: Grossly intact, no focal deficits, moving all 4 extremities. Psychiatric: Normal mood and affect.   Laboratory Data: CMP Latest Ref Rng & Units 05/10/2021 04/25/2020 12/09/2017  Glucose 70 - 99 mg/dL 87 92 103(H)  BUN 6 - 23 mg/dL 13 13 20   Creatinine 0.40 - 1.50 mg/dL 1.17 1.07 1.22  Sodium 135 - 145 mEq/L 140 142 139  Potassium 3.5 - 5.1 mEq/L 4.2 4.8 4.2  Chloride 96 - 112 mEq/L 105 108(H) 106  CO2 19 - 32 mEq/L 28 21 28   Calcium 8.4 - 10.5 mg/dL 9.3 9.8 10.0  Total Protein 6.0 - 8.3 g/dL 6.2 6.9 7.3  Total Bilirubin 0.2 - 1.2 mg/dL 0.5 0.4 0.5  Alkaline Phos 39 - 117 U/L 65 82 62  AST 0 - 37 U/L 18 17 18   ALT 0 - 53 U/L 22 20 21     CBC Latest Ref Rng &  Units 05/10/2021 12/09/2017 08/18/2016  WBC 4.0 - 10.5 K/uL 13.9(H) 14.3(H) 15.1(H)  Hemoglobin 13.0 - 17.0 g/dL 15.4 17.0 16.5  Hematocrit 39.0 - 52.0 % 45.1 49.7 47.8  Platelets 150.0 - 400.0 K/uL 242.0 313.0 343.0    Lipid Panel     Component Value Date/Time   CHOL 100 05/10/2021 0842   CHOL 182 04/25/2020 0937   TRIG 89.0 05/10/2021 0842   HDL 31.30 (L) 05/10/2021 0842   HDL 30 (L) 04/25/2020 0937   CHOLHDL 3 05/10/2021 0842   VLDL 17.8 05/10/2021 0842   LDLCALC 51 05/10/2021 0842   LDLCALC 130 (H) 04/25/2020 0937   LDLDIRECT 98 04/20/2019 1422   LABVLDL 22 04/25/2020 0937    Component     Latest Ref Rng & Units 05/10/2021  TSH     0.35 - 5.50 uIU/mL 1.80    Pertinent Imaging: N/A  Assessment & Plan:    1. Cancer of prostate with low recurrence risk (stage T1-2a and Gleason < 7 and PSA < 10) (HCC) -PSA pending   2. BPH with obstruction/lower urinary tract symptoms -Continue  conservative management  3. Herpes simplex -Refill of Valtrex given    Return in about 6 months (around 04/16/2022) for IPSS, PSA and exam.  Zara Council, [facility]  [place] [address], [place], [place] [address] [phone]

## 2021-10-17 ENCOUNTER — Ambulatory Visit: Payer: Medicare Other | Admitting: Urology

## 2021-10-17 ENCOUNTER — Other Ambulatory Visit: Payer: Self-pay

## 2021-10-17 ENCOUNTER — Encounter: Payer: Self-pay | Admitting: Urology

## 2021-10-17 VITALS — BP 121/84 | HR 74 | Ht 69.0 in | Wt 192.0 lb

## 2021-10-17 DIAGNOSIS — N529 Male erectile dysfunction, unspecified: Secondary | ICD-10-CM

## 2021-10-17 DIAGNOSIS — N138 Other obstructive and reflux uropathy: Secondary | ICD-10-CM

## 2021-10-17 DIAGNOSIS — C61 Malignant neoplasm of prostate: Secondary | ICD-10-CM | POA: Diagnosis not present

## 2021-10-17 DIAGNOSIS — B009 Herpesviral infection, unspecified: Secondary | ICD-10-CM | POA: Diagnosis not present

## 2021-10-17 DIAGNOSIS — N401 Enlarged prostate with lower urinary tract symptoms: Secondary | ICD-10-CM | POA: Diagnosis not present

## 2021-10-18 LAB — PSA: Prostate Specific Ag, Serum: 3.4 ng/mL (ref 0.0–4.0)

## 2021-11-06 DIAGNOSIS — M4326 Fusion of spine, lumbar region: Secondary | ICD-10-CM | POA: Diagnosis not present

## 2021-11-06 DIAGNOSIS — M5442 Lumbago with sciatica, left side: Secondary | ICD-10-CM | POA: Diagnosis not present

## 2021-11-06 DIAGNOSIS — M9903 Segmental and somatic dysfunction of lumbar region: Secondary | ICD-10-CM | POA: Diagnosis not present

## 2021-11-06 DIAGNOSIS — M9905 Segmental and somatic dysfunction of pelvic region: Secondary | ICD-10-CM | POA: Diagnosis not present

## 2021-11-06 DIAGNOSIS — M9902 Segmental and somatic dysfunction of thoracic region: Secondary | ICD-10-CM | POA: Diagnosis not present

## 2021-11-08 DIAGNOSIS — M9903 Segmental and somatic dysfunction of lumbar region: Secondary | ICD-10-CM | POA: Diagnosis not present

## 2021-11-08 DIAGNOSIS — M9902 Segmental and somatic dysfunction of thoracic region: Secondary | ICD-10-CM | POA: Diagnosis not present

## 2021-11-08 DIAGNOSIS — M4326 Fusion of spine, lumbar region: Secondary | ICD-10-CM | POA: Diagnosis not present

## 2021-11-08 DIAGNOSIS — M5442 Lumbago with sciatica, left side: Secondary | ICD-10-CM | POA: Diagnosis not present

## 2021-11-08 DIAGNOSIS — M9905 Segmental and somatic dysfunction of pelvic region: Secondary | ICD-10-CM | POA: Diagnosis not present

## 2021-11-12 DIAGNOSIS — M5442 Lumbago with sciatica, left side: Secondary | ICD-10-CM | POA: Diagnosis not present

## 2021-11-12 DIAGNOSIS — M9903 Segmental and somatic dysfunction of lumbar region: Secondary | ICD-10-CM | POA: Diagnosis not present

## 2021-11-12 DIAGNOSIS — M9905 Segmental and somatic dysfunction of pelvic region: Secondary | ICD-10-CM | POA: Diagnosis not present

## 2021-11-12 DIAGNOSIS — M9902 Segmental and somatic dysfunction of thoracic region: Secondary | ICD-10-CM | POA: Diagnosis not present

## 2021-11-12 DIAGNOSIS — M4326 Fusion of spine, lumbar region: Secondary | ICD-10-CM | POA: Diagnosis not present

## 2021-11-15 DIAGNOSIS — M9903 Segmental and somatic dysfunction of lumbar region: Secondary | ICD-10-CM | POA: Diagnosis not present

## 2021-11-15 DIAGNOSIS — M4326 Fusion of spine, lumbar region: Secondary | ICD-10-CM | POA: Diagnosis not present

## 2021-11-15 DIAGNOSIS — M9902 Segmental and somatic dysfunction of thoracic region: Secondary | ICD-10-CM | POA: Diagnosis not present

## 2021-11-15 DIAGNOSIS — M9905 Segmental and somatic dysfunction of pelvic region: Secondary | ICD-10-CM | POA: Diagnosis not present

## 2021-11-15 DIAGNOSIS — M5442 Lumbago with sciatica, left side: Secondary | ICD-10-CM | POA: Diagnosis not present

## 2021-11-19 DIAGNOSIS — M9902 Segmental and somatic dysfunction of thoracic region: Secondary | ICD-10-CM | POA: Diagnosis not present

## 2021-11-19 DIAGNOSIS — M5442 Lumbago with sciatica, left side: Secondary | ICD-10-CM | POA: Diagnosis not present

## 2021-11-19 DIAGNOSIS — M9903 Segmental and somatic dysfunction of lumbar region: Secondary | ICD-10-CM | POA: Diagnosis not present

## 2021-11-19 DIAGNOSIS — M4326 Fusion of spine, lumbar region: Secondary | ICD-10-CM | POA: Diagnosis not present

## 2021-11-19 DIAGNOSIS — M9905 Segmental and somatic dysfunction of pelvic region: Secondary | ICD-10-CM | POA: Diagnosis not present

## 2021-11-22 DIAGNOSIS — M4326 Fusion of spine, lumbar region: Secondary | ICD-10-CM | POA: Diagnosis not present

## 2021-11-22 DIAGNOSIS — M9902 Segmental and somatic dysfunction of thoracic region: Secondary | ICD-10-CM | POA: Diagnosis not present

## 2021-11-22 DIAGNOSIS — M9905 Segmental and somatic dysfunction of pelvic region: Secondary | ICD-10-CM | POA: Diagnosis not present

## 2021-11-22 DIAGNOSIS — M9903 Segmental and somatic dysfunction of lumbar region: Secondary | ICD-10-CM | POA: Diagnosis not present

## 2021-11-22 DIAGNOSIS — M5442 Lumbago with sciatica, left side: Secondary | ICD-10-CM | POA: Diagnosis not present

## 2021-11-26 DIAGNOSIS — M5442 Lumbago with sciatica, left side: Secondary | ICD-10-CM | POA: Diagnosis not present

## 2021-11-26 DIAGNOSIS — M9902 Segmental and somatic dysfunction of thoracic region: Secondary | ICD-10-CM | POA: Diagnosis not present

## 2021-11-26 DIAGNOSIS — M9903 Segmental and somatic dysfunction of lumbar region: Secondary | ICD-10-CM | POA: Diagnosis not present

## 2021-11-26 DIAGNOSIS — M9905 Segmental and somatic dysfunction of pelvic region: Secondary | ICD-10-CM | POA: Diagnosis not present

## 2021-11-26 DIAGNOSIS — M4326 Fusion of spine, lumbar region: Secondary | ICD-10-CM | POA: Diagnosis not present

## 2021-11-29 DIAGNOSIS — M4326 Fusion of spine, lumbar region: Secondary | ICD-10-CM | POA: Diagnosis not present

## 2021-11-29 DIAGNOSIS — M9902 Segmental and somatic dysfunction of thoracic region: Secondary | ICD-10-CM | POA: Diagnosis not present

## 2021-11-29 DIAGNOSIS — M9903 Segmental and somatic dysfunction of lumbar region: Secondary | ICD-10-CM | POA: Diagnosis not present

## 2021-11-29 DIAGNOSIS — M9905 Segmental and somatic dysfunction of pelvic region: Secondary | ICD-10-CM | POA: Diagnosis not present

## 2021-11-29 DIAGNOSIS — M5442 Lumbago with sciatica, left side: Secondary | ICD-10-CM | POA: Diagnosis not present

## 2021-12-09 DIAGNOSIS — M4326 Fusion of spine, lumbar region: Secondary | ICD-10-CM | POA: Diagnosis not present

## 2021-12-09 DIAGNOSIS — M9905 Segmental and somatic dysfunction of pelvic region: Secondary | ICD-10-CM | POA: Diagnosis not present

## 2021-12-09 DIAGNOSIS — M5442 Lumbago with sciatica, left side: Secondary | ICD-10-CM | POA: Diagnosis not present

## 2021-12-09 DIAGNOSIS — M9903 Segmental and somatic dysfunction of lumbar region: Secondary | ICD-10-CM | POA: Diagnosis not present

## 2021-12-09 DIAGNOSIS — M9902 Segmental and somatic dysfunction of thoracic region: Secondary | ICD-10-CM | POA: Diagnosis not present

## 2021-12-16 DIAGNOSIS — M4326 Fusion of spine, lumbar region: Secondary | ICD-10-CM | POA: Diagnosis not present

## 2021-12-16 DIAGNOSIS — M9905 Segmental and somatic dysfunction of pelvic region: Secondary | ICD-10-CM | POA: Diagnosis not present

## 2021-12-16 DIAGNOSIS — M9903 Segmental and somatic dysfunction of lumbar region: Secondary | ICD-10-CM | POA: Diagnosis not present

## 2021-12-16 DIAGNOSIS — M9902 Segmental and somatic dysfunction of thoracic region: Secondary | ICD-10-CM | POA: Diagnosis not present

## 2021-12-16 DIAGNOSIS — M5442 Lumbago with sciatica, left side: Secondary | ICD-10-CM | POA: Diagnosis not present

## 2022-01-02 DIAGNOSIS — R231 Pallor: Secondary | ICD-10-CM | POA: Diagnosis not present

## 2022-01-02 DIAGNOSIS — R112 Nausea with vomiting, unspecified: Secondary | ICD-10-CM | POA: Diagnosis not present

## 2022-01-02 DIAGNOSIS — R1111 Vomiting without nausea: Secondary | ICD-10-CM | POA: Diagnosis not present

## 2022-01-02 DIAGNOSIS — R197 Diarrhea, unspecified: Secondary | ICD-10-CM | POA: Diagnosis not present

## 2022-01-02 DIAGNOSIS — R1084 Generalized abdominal pain: Secondary | ICD-10-CM | POA: Diagnosis not present

## 2022-01-29 DIAGNOSIS — M9902 Segmental and somatic dysfunction of thoracic region: Secondary | ICD-10-CM | POA: Diagnosis not present

## 2022-01-29 DIAGNOSIS — M4326 Fusion of spine, lumbar region: Secondary | ICD-10-CM | POA: Diagnosis not present

## 2022-01-29 DIAGNOSIS — M9903 Segmental and somatic dysfunction of lumbar region: Secondary | ICD-10-CM | POA: Diagnosis not present

## 2022-01-29 DIAGNOSIS — M5442 Lumbago with sciatica, left side: Secondary | ICD-10-CM | POA: Diagnosis not present

## 2022-01-29 DIAGNOSIS — M9905 Segmental and somatic dysfunction of pelvic region: Secondary | ICD-10-CM | POA: Diagnosis not present

## 2022-02-06 ENCOUNTER — Other Ambulatory Visit: Payer: Self-pay | Admitting: Cardiovascular Disease

## 2022-02-07 DIAGNOSIS — M9903 Segmental and somatic dysfunction of lumbar region: Secondary | ICD-10-CM | POA: Diagnosis not present

## 2022-02-07 DIAGNOSIS — M9902 Segmental and somatic dysfunction of thoracic region: Secondary | ICD-10-CM | POA: Diagnosis not present

## 2022-02-07 DIAGNOSIS — M4326 Fusion of spine, lumbar region: Secondary | ICD-10-CM | POA: Diagnosis not present

## 2022-02-07 DIAGNOSIS — M5442 Lumbago with sciatica, left side: Secondary | ICD-10-CM | POA: Diagnosis not present

## 2022-02-07 DIAGNOSIS — M9905 Segmental and somatic dysfunction of pelvic region: Secondary | ICD-10-CM | POA: Diagnosis not present

## 2022-02-21 ENCOUNTER — Encounter: Payer: Self-pay | Admitting: Family Medicine

## 2022-02-21 ENCOUNTER — Encounter: Payer: Self-pay | Admitting: Cardiovascular Disease

## 2022-02-21 DIAGNOSIS — R109 Unspecified abdominal pain: Secondary | ICD-10-CM | POA: Diagnosis not present

## 2022-02-21 DIAGNOSIS — I480 Paroxysmal atrial fibrillation: Secondary | ICD-10-CM | POA: Diagnosis not present

## 2022-02-21 DIAGNOSIS — E785 Hyperlipidemia, unspecified: Secondary | ICD-10-CM | POA: Diagnosis not present

## 2022-02-21 DIAGNOSIS — I2510 Atherosclerotic heart disease of native coronary artery without angina pectoris: Secondary | ICD-10-CM | POA: Diagnosis not present

## 2022-02-21 DIAGNOSIS — K429 Umbilical hernia without obstruction or gangrene: Secondary | ICD-10-CM | POA: Diagnosis not present

## 2022-02-21 DIAGNOSIS — I700 Atherosclerosis of aorta: Secondary | ICD-10-CM | POA: Diagnosis not present

## 2022-02-21 DIAGNOSIS — K439 Ventral hernia without obstruction or gangrene: Secondary | ICD-10-CM | POA: Diagnosis not present

## 2022-02-21 DIAGNOSIS — D72829 Elevated white blood cell count, unspecified: Secondary | ICD-10-CM | POA: Diagnosis not present

## 2022-02-21 DIAGNOSIS — Z955 Presence of coronary angioplasty implant and graft: Secondary | ICD-10-CM | POA: Diagnosis not present

## 2022-02-21 DIAGNOSIS — K420 Umbilical hernia with obstruction, without gangrene: Secondary | ICD-10-CM | POA: Diagnosis not present

## 2022-02-21 DIAGNOSIS — I714 Abdominal aortic aneurysm, without rupture, unspecified: Secondary | ICD-10-CM | POA: Diagnosis not present

## 2022-02-21 DIAGNOSIS — I252 Old myocardial infarction: Secondary | ICD-10-CM | POA: Diagnosis not present

## 2022-02-21 DIAGNOSIS — I10 Essential (primary) hypertension: Secondary | ICD-10-CM | POA: Diagnosis not present

## 2022-02-21 NOTE — Telephone Encounter (Signed)
Patient is following up to ensure that Dr. Rockey Situ gets his message. He states he would like to discuss stent consideration. ?

## 2022-02-21 NOTE — Telephone Encounter (Signed)
Patient wanted to make sure Dr. Glori Bickers saw his labs, [place], plan to return home this weekend ?

## 2022-02-21 NOTE — Telephone Encounter (Signed)
Left VM requesting pt to call the office back 

## 2022-02-21 NOTE — Telephone Encounter (Signed)
Error

## 2022-02-24 ENCOUNTER — Encounter: Payer: Self-pay | Admitting: Family Medicine

## 2022-02-24 DIAGNOSIS — M5442 Lumbago with sciatica, left side: Secondary | ICD-10-CM | POA: Diagnosis not present

## 2022-02-24 DIAGNOSIS — M4326 Fusion of spine, lumbar region: Secondary | ICD-10-CM | POA: Diagnosis not present

## 2022-02-24 DIAGNOSIS — M9903 Segmental and somatic dysfunction of lumbar region: Secondary | ICD-10-CM | POA: Diagnosis not present

## 2022-02-24 DIAGNOSIS — M9905 Segmental and somatic dysfunction of pelvic region: Secondary | ICD-10-CM | POA: Diagnosis not present

## 2022-02-24 DIAGNOSIS — M9902 Segmental and somatic dysfunction of thoracic region: Secondary | ICD-10-CM | POA: Diagnosis not present

## 2022-02-24 NOTE — Telephone Encounter (Signed)
Patient called wold like to hold off until appointment tomorrow to talk about where referral needs to be sent.  ?

## 2022-02-25 ENCOUNTER — Ambulatory Visit (INDEPENDENT_AMBULATORY_CARE_PROVIDER_SITE_OTHER): Payer: Medicare Other | Admitting: Family Medicine

## 2022-02-25 ENCOUNTER — Ambulatory Visit (INDEPENDENT_AMBULATORY_CARE_PROVIDER_SITE_OTHER)
Admission: RE | Admit: 2022-02-25 | Discharge: 2022-02-25 | Disposition: A | Discharge status: Home / Self Care | Payer: Medicare Other | Source: Ambulatory Visit | Attending: Family Medicine | Admitting: Family Medicine

## 2022-02-25 ENCOUNTER — Encounter: Payer: Self-pay | Admitting: Family Medicine

## 2022-02-25 VITALS — BP 122/78 | HR 59 | Ht 69.0 in | Wt 192.0 lb

## 2022-02-25 DIAGNOSIS — I25118 Atherosclerotic heart disease of native coronary artery with other forms of angina pectoris: Secondary | ICD-10-CM

## 2022-02-25 DIAGNOSIS — D72829 Elevated white blood cell count, unspecified: Secondary | ICD-10-CM | POA: Diagnosis not present

## 2022-02-25 DIAGNOSIS — IMO0001 Reserved for inherently not codable concepts without codable children: Secondary | ICD-10-CM

## 2022-02-25 DIAGNOSIS — F17200 Nicotine dependence, unspecified, uncomplicated: Secondary | ICD-10-CM | POA: Diagnosis not present

## 2022-02-25 DIAGNOSIS — E782 Mixed hyperlipidemia: Secondary | ICD-10-CM | POA: Diagnosis not present

## 2022-02-25 DIAGNOSIS — R059 Cough, unspecified: Secondary | ICD-10-CM | POA: Insufficient documentation

## 2022-02-25 DIAGNOSIS — K429 Umbilical hernia without obstruction or gangrene: Secondary | ICD-10-CM

## 2022-02-25 DIAGNOSIS — R052 Subacute cough: Secondary | ICD-10-CM

## 2022-02-25 DIAGNOSIS — I739 Peripheral vascular disease, unspecified: Secondary | ICD-10-CM

## 2022-02-25 NOTE — Assessment & Plan Note (Addendum)
Stable under care of Dr Gollan/cardiology  ?Taking crestor low dose (5 mg daily) and zetia LDL is down to 51  ?On plavix and asa  ?ntg prn  ? ?Still smokes and not ready to quit  ? ? ?

## 2022-02-25 NOTE — Patient Instructions (Addendum)
I will place a referral to vascular surgery and also to general surgery  ? ?If you don't hear in 1-2 weeks please let us know  ? ?If your hernia hurts please let us know (go to ER if severe)  ?Please keep [place] posted  ?Be careful with straining  ? ?Chest xray today  ? ?Lab for cbc today  ? ?Make a list of reasons to quit smoking/ keep thinking about it  ? ?Eventually I want to consider testing for COPD ? ? ? ? ?

## 2022-02-25 NOTE — Assessment & Plan Note (Signed)
Now taking crestor 5 mg daily and zetia 10  ?Tolerating well ?Under care of cardiology for CAD ?Disc goals for lipids and reasons to control them ?Rev last labs with pt ?Rev low sat fat diet in detail ?Last LDL of 51 ? ?

## 2022-02-25 NOTE — Assessment & Plan Note (Signed)
Recently worsening junky sounding cough w/o much phlegm  ?occ wheezing ? ?Reassuring exam  ?Suspect this may be smokers cough/ early copd ?cxr ordered  ?May want to consider PFTs in future after other health problems are addressed ?Strongly enc smoking cessation /pt is not ready but thinking about it  ?

## 2022-02-25 NOTE — Assessment & Plan Note (Signed)
3-4 cm and reducible today  ?Was seen at [facility] in [place] for pain  ?Reviewed hospital records, lab results and studies in detail   ? ?Planning referral to general surgeon  ?Disc s/s of incarceration as well as ER precautions ?inst to avoid straining  ?Handout given  ? ?

## 2022-02-25 NOTE — Assessment & Plan Note (Signed)
Incidental finding of iliac disease noted on recent CT of abd and pelvis along with severely diseased aorta  ? ?Pt has baseline radicular leg pain so difficult assess for claudication  ? ?Referral made to vascular surgery for eval  ?Encouraged strongly to quit smoking  ?Takes crestor and zetia with most recent LDL of 51 ?Known low HDL ?Known CAD/stable  ? ?Disc s/s of vasc dz and handout given  ?

## 2022-02-25 NOTE — Progress Notes (Signed)
? ?Subjective:  ? ? Patient ID: Charles Holder, male    DOB: [birth year], 56 y.o.   MRN: [id number] ? ?HPI ?Pt presents for abd pain from umb hernia and vascular disease  ? ?Wt Readings from Last 3 Encounters:  ?02/25/22 192 lb (87.1 kg)  ?10/17/21 192 lb (87.1 kg)  ?05/14/21 191 lb 1 oz (86.7 kg)  ? ?28.35 kg/m? ? ?Had a visit to ER for abd pain  ([facility] - in [place])  ? ?Wbc elevated at 15.7  ?GFR over 60 ?Neg ua  ? ?CT scan imp ? ?Impression:  ?IMPRESSION:  ? ?FAT-CONTAINING UMBILICAL HERNIA. ? ?CHRONIC OCCLUSION OF THE LEFT COMMON ILIAC ARTERY AND RECONSTITUTION OF THE LEFT EXTERNAL ILIAC ARTERY. ? ?SEVERELY DISEASED ABDOMINAL AORTA. ? ?Electronically Signed by: Gwenlyn Fudge on 02/21/2022 1:10 PM  ? ?Interested in seeing Charles Holder at [facility]  ? ? ?Hard to tell if he has claudication because back makes legs hurt anyway  ? ?Taking zetia and crestor  ? ?Lab Results  ?Component Value Date  ? CHOL 100 05/10/2021  ? HDL 31.30 (L) 05/10/2021  ? LDLCALC 51 05/10/2021  ? LDLDIRECT 98 04/20/2019  ? TRIG 89.0 05/10/2021  ? CHOLHDL 3 05/10/2021  ? ? ?Has changed cig brand  ?Less than 1 ppd  ? ?Does not want to quit badly enough  ? ?Has some cough  ?Hears phlegm/rattle  ?Phlegm is clear  ?Some wheezing  ? ?Umbilical hernia -for years  ?Was painful with ER visit  ?Was hard  ? ?Now better  ? ?HTN ?bp is stable today  ?No cp or palpitations or headaches or edema  ?No side effects to medicines  ?BP Readings from Last 3 Encounters:  ?02/25/22 122/78  ?10/17/21 121/84  ?05/14/21 118/76  ?   ?Controlled with amlodipine 2.5 mg daily  ? ?Pulse Readings from Last 3 Encounters:  ?02/25/22 (!) 59  ?10/17/21 74  ?05/14/21 (!) 55  ? ? ?Patient Active Problem List  ? Diagnosis Date Noted  ? Cough 02/25/2022  ? Umbilical hernia 45/62/5638  ? PVD (peripheral vascular disease) ([place]) 02/25/2022  ? Routine general medical examination at a health care facility 05/14/2021  ? Antiplatelet or antithrombotic  long-term use 12/16/2017  ? Special screening for malignant neoplasms, colon 12/16/2017  ? Leukocytosis 08/20/2016  ? Anxiety and depression 08/18/2016  ? Prostate cancer ([place]) 03/14/2016  ? Herpes 03/14/2016  ? Essential hypertension 08/31/2012  ? CAD (coronary artery disease) 02/07/2011  ? Stented coronary artery 02/07/2011  ? Smoking 02/07/2011  ? Mixed hyperlipidemia 02/07/2011  ? Chronic back pain 06/05/2008  ? ?Past Medical History:  ?Diagnosis Date  ? Adopted   ? Anxiety   ? Atrial fibrillation ([place])   ? BPH (benign prostatic hyperplasia)   ? Cardiomyopathy, ischemic 01/22/2011  ? EF 50%  ? Chronic lower back pain   ? s/p L5-S1  ? Coronary artery disease   ? Depression   ? ED (erectile dysfunction)   ? Fatigue   ? Headache   ? Heart attack ([place])   ? Herpes genitalia   ? Hyperlipidemia   ? Hypertension   ? Hypogonadism in male   ? Neuropathy   ? Obesity   ? Prostate cancer ([place])   ? SOB (shortness of breath)   ? ?Past Surgical History:  ?Procedure Laterality Date  ? back surgeries    ? x 4  ? CORONARY ANGIOPLASTY  01/22/2011  ? stent  ? ?Social History  ? ?  Tobacco Use  ? Smoking status: Every Day  ?  Packs/day: 1.00  ?  Types: Cigarettes  ? Smokeless tobacco: Never  ?Vaping Use  ? Vaping Use: Former  ?Substance Use Topics  ? Alcohol use: No  ? Drug use: Yes  ?  Types: Marijuana  ? ?Family History  ?Adopted: Yes  ?Problem Relation Age of Onset  ? Prostate cancer Neg Hx   ? ?Allergies  ?Allergen Reactions  ? Codeine   ?  Makes me sick   ? ?Current Outpatient Medications on File Prior to Visit  ?Medication Sig Dispense Refill  ? amLODipine (NORVASC) 5 MG tablet Take 1 tablet (5 mg total) by mouth daily. (Patient taking differently: Take 2.5 mg by mouth daily.) 90 tablet 3  ? aspirin 81 MG EC tablet Take 81 mg by mouth daily.    ? clopidogrel (PLAVIX) 75 MG tablet Take 1 tablet (75 mg total) by mouth daily. 90 tablet 3  ? ezetimibe (ZETIA) 10 MG tablet TAKE 1 TABLET BY MOUTH DAILY 90 tablet 3  ? Multiple  Vitamins-Minerals (MULTIVITAMIN ADULT PO) Take by mouth.    ? nitroGLYCERIN (NITROSTAT) 0.4 MG SL tablet PLACE 1 TABLET UNDER TONGUE EVERY 5 MIN AS NEEDED FOR CHEST PAIN IF NO RELIEF IN15 MIN CALL 911 (MAX 3 TABS) 25 tablet 0  ? Omega-3 Fatty Acids (FISH OIL) 1000 MG CAPS Take by mouth.    ? Probiotic Product (PROBIOTIC ADVANCED PO) Take by mouth.    ? rosuvastatin (CRESTOR) 5 MG tablet TAKE 1 TABLET BY MOUTH DAILY 90 tablet 0  ? valACYclovir (VALTREX) 1000 MG tablet Take 1 tablet (1,000 mg total) by mouth daily. 90 tablet 3  ? ?No current facility-administered medications on file prior to visit.  ?  ? ?Review of Systems  ?Constitutional:  Negative for activity change, appetite change, fatigue, fever and unexpected weight change.  ?HENT:  Negative for congestion, rhinorrhea, sore throat and trouble swallowing.   ?Eyes:  Negative for pain, redness, itching and visual disturbance.  ?Respiratory:  Negative for cough, chest tightness, shortness of breath and wheezing.   ?Cardiovascular:  Negative for chest pain and palpitations.  ?Gastrointestinal:  Negative for abdominal pain, blood in stool, constipation, diarrhea and nausea.  ?Endocrine: Negative for cold intolerance, heat intolerance, polydipsia and polyuria.  ?Genitourinary:  Negative for difficulty urinating, dysuria, frequency and urgency.  ?Musculoskeletal:  Negative for arthralgias, joint swelling and myalgias.  ?Skin:  Negative for pallor and rash.  ?Neurological:  Negative for dizziness, tremors, weakness, numbness and headaches.  ?Hematological:  Negative for adenopathy. Does not bruise/bleed easily.  ?Psychiatric/Behavioral:  Negative for decreased concentration and dysphoric mood. The patient is not nervous/anxious.   ? ?   ?Objective:  ? Physical Exam ?Constitutional:   ?   General: He is not in acute distress. ?   Appearance: Normal appearance. He is well-developed and normal weight. He is not ill-appearing or diaphoretic.  ?HENT:  ?   Head:  Normocephalic and atraumatic.  ?Eyes:  ?   General: No scleral icterus. ?   Conjunctiva/sclera: Conjunctivae normal.  ?   Pupils: Pupils are equal, round, and reactive to light.  ?Neck:  ?   Thyroid: No thyromegaly.  ?   Vascular: No carotid bruit or JVD.  ?Cardiovascular:  ?   Rate and Rhythm: Regular rhythm. Bradycardia present.  ?   Heart sounds: Normal heart sounds.  ?  No gallop.  ?   Comments: Post tibialis pulse is fair  ?Dorsalis pedis  pulse is more faint  ? ?Feet feel well perfused ?No calf tenderness ?Pulmonary:  ?   Effort: Pulmonary effort is normal. No respiratory distress.  ?   Breath sounds: Normal breath sounds. No wheezing or rales.  ?Abdominal:  ?   General: Abdomen is flat. Bowel sounds are normal. There is no distension or abdominal bruit.  ?   Palpations: Abdomen is soft. There is no hepatomegaly, splenomegaly, mass or pulsatile mass.  ?   Hernia: A hernia is present. Hernia is present in the umbilical area.  ?   Comments: 3-4 cm umbilical hernia  ?Easily reducible  ?Non tender  ? ?  ?Musculoskeletal:  ?   Cervical back: Normal range of motion and neck supple. No tenderness.  ?   Right lower leg: No edema.  ?   Left lower leg: No edema.  ?Lymphadenopathy:  ?   Cervical: No cervical adenopathy.  ?Skin: ?   General: Skin is warm and dry.  ?   Coloration: Skin is not jaundiced or pale.  ?   Findings: No bruising, erythema or rash.  ?Neurological:  ?   Mental Status: He is alert.  ?   Motor: No weakness.  ?   Coordination: Coordination normal.  ?   Deep Tendon Reflexes: Reflexes are normal and symmetric. Reflexes normal.  ?Psychiatric:     ?   Mood and Affect: Mood normal.  ? ? ? ? ? ?   ?Assessment & Plan:  ? ?Problem List Items Addressed This Visit   ? ?  ? Cardiovascular and Mediastinum  ? CAD (coronary artery disease)  ?  Stable under care of Dr Gollan/cardiology  ?Taking crestor low dose (5 mg daily) and zetia LDL is down to 51  ?On plavix and asa  ?ntg prn  ? ?Still smokes and not ready to  quit  ? ? ? ?  ?  ? PVD (peripheral vascular disease) ([place])  ?  Incidental finding of iliac disease noted on recent CT of abd and pelvis along with severely diseased aorta  ? ?Pt has baseline radicular leg pain so difficult assess

## 2022-02-25 NOTE — Assessment & Plan Note (Signed)
Continues to smoke ?Has cut to under 1ppd  ? ?Has known hx of CAD ?Now looks like aortic and iliac dz  ?Also some smokers cough/poss early copd ? ?Disc reasons to quit ?He is not ready yet but thinking more about it  ?Offered help if needed  ?

## 2022-02-25 NOTE — Assessment & Plan Note (Signed)
Chronic and asymptomatic  ? ?Last wbc up to 15.7 when in ER in [facility] for abd pain  ?Feeling better now  ?Will re check this and order path review ?Most likely from smoking  ?

## 2022-02-26 LAB — CBC WITH DIFFERENTIAL/PLATELET
Absolute Monocytes: 874 cells/uL (ref 200–950)
Basophils Absolute: 67 cells/uL (ref 0–200)
Basophils Relative: 0.6 %
Eosinophils Absolute: 347 cells/uL (ref 15–500)
Eosinophils Relative: 3.1 %
HCT: 48.3 % (ref 38.5–50.0)
Hemoglobin: 16.5 g/dL (ref 13.2–17.1)
Lymphs Abs: 2968 cells/uL (ref 850–3900)
MCH: 31 pg (ref 27.0–33.0)
MCHC: 34.2 g/dL (ref 32.0–36.0)
MCV: 90.8 fL (ref 80.0–100.0)
MPV: 9.9 fL (ref 7.5–12.5)
Monocytes Relative: 7.8 %
Neutro Abs: 6944 cells/uL (ref 1500–7800)
Neutrophils Relative %: 62 %
Platelets: 275 10*3/uL (ref 140–400)
RBC: 5.32 10*6/uL (ref 4.20–5.80)
RDW: 12.7 % (ref 11.0–15.0)
Total Lymphocyte: 26.5 %
WBC: 11.2 10*3/uL — ABNORMAL HIGH (ref 3.8–10.8)

## 2022-02-26 LAB — PATHOLOGIST SMEAR REVIEW

## 2022-03-05 ENCOUNTER — Ambulatory Visit: Payer: Medicare Other | Admitting: Vascular Surgery

## 2022-03-05 ENCOUNTER — Telehealth: Payer: Self-pay | Admitting: Family Medicine

## 2022-03-05 ENCOUNTER — Encounter: Payer: Self-pay | Admitting: Vascular Surgery

## 2022-03-05 VITALS — BP 132/86 | HR 60 | Temp 98.2°F | Resp 16 | Ht 69.0 in | Wt 193.6 lb

## 2022-03-05 DIAGNOSIS — M9902 Segmental and somatic dysfunction of thoracic region: Secondary | ICD-10-CM | POA: Diagnosis not present

## 2022-03-05 DIAGNOSIS — M9905 Segmental and somatic dysfunction of pelvic region: Secondary | ICD-10-CM | POA: Diagnosis not present

## 2022-03-05 DIAGNOSIS — I739 Peripheral vascular disease, unspecified: Secondary | ICD-10-CM | POA: Diagnosis not present

## 2022-03-05 DIAGNOSIS — M9903 Segmental and somatic dysfunction of lumbar region: Secondary | ICD-10-CM | POA: Diagnosis not present

## 2022-03-05 DIAGNOSIS — M4326 Fusion of spine, lumbar region: Secondary | ICD-10-CM | POA: Diagnosis not present

## 2022-03-05 DIAGNOSIS — M5442 Lumbago with sciatica, left side: Secondary | ICD-10-CM | POA: Diagnosis not present

## 2022-03-05 NOTE — Telephone Encounter (Signed)
Called pt and he said he did see the vascular specialist this morning and they said that he does need some work done but they told him he needs to get his hernia fixed 1st before they feel comfortable doing anything. Pt is calling to proceed with surgeon referral to get hernia repaired asap. Pt said he is willing to go anywhere PCP recommends that he go.   I see a referral was placed but it may be closed/ cancelled so will route message to PCP and [place].

## 2022-03-05 NOTE — Progress Notes (Addendum)
Vascular and Vein Specialist of [place]  Patient name: Charles Holder MRN: [id number] DOB: 03/13/1966 Sex: male  REASON FOR CONSULT: Evaluation lower extremity arterial insufficiency  HPI: Charles Holder is a 56 y.o. male, who is here today for evaluation of lower extremity arterial insufficiency.  He is here today with his partner.  He reports that he was seen in urgent care facility at [facility] [place].  He has known history of umbilical hernia and apparently had incarceration.  This was manually reduced at the [facility].  He did have a CT scan which apparently showed iliac occlusive disease and he is here today for further discussion of this.  Unfortunately I do not have the images or the report currently.  We will obtain this.  He does have a long history of degenerative disc disease with multiple prior back surgeries.  This is caused back pain in the past and also lower extremity symptoms in the past.  He reports that currently he has difficulty with extended walking.  This is worse on the left than on the right.  He reports that if he walks for any significant distance that his entire left leg becomes numb and he can actually have this give way and fall.  He does not have any lower extremity tissue loss.  Does not have any symptoms consistent with arterial rest pain.  He does have history of coronary disease with prior coronary angioplasty.  Past Medical History:  Diagnosis Date   Adopted    Anxiety    Atrial fibrillation (HCC)    BPH (benign prostatic hyperplasia)    Cardiomyopathy, ischemic 01/22/2011   EF 50%   Chronic lower back pain    s/p L5-S1   Coronary artery disease    Depression    ED (erectile dysfunction)    Fatigue    Headache    Heart attack ([place])    Herpes genitalia    Hyperlipidemia    Hypertension    Hypogonadism in male    Neuropathy    Obesity    Prostate cancer ([place])    SOB  (shortness of breath)     Family History  Adopted: Yes  Problem Relation Age of Onset   Prostate cancer Neg Hx     SOCIAL HISTORY: Social History   Socioeconomic History   Marital status: Single    Spouse name: Not on file   Number of children: Not on file   Years of education: Not on file   Highest education level: Not on file  Occupational History   Not on file  Tobacco Use   Smoking status: Every Day    Packs/day: 1.00    Types: Cigarettes   Smokeless tobacco: Never  Vaping Use   Vaping Use: Former  Substance and Sexual Activity   Alcohol use: No   Drug use: Yes    Types: Marijuana   Sexual activity: Yes    Birth control/protection: None  Other Topics Concern   Not on file  Social History Narrative   Not on file   Social Determinants of Health   Financial Resource Strain: Low Risk    Difficulty of Paying Living Expenses: Not hard at all  Food Insecurity: No Food Insecurity   Worried About Charity fundraiser in the Last Year: Never true   Ran Out of Food in the Last Year: Never true  Transportation Needs: No Transportation Needs   Lack of Transportation (  Medical): No   Lack of Transportation (Non-Medical): No  Physical Activity: Inactive   Days of Exercise per Week: 0 days   Minutes of Exercise per Session: 0 min  Stress: No Stress Concern Present   Feeling of Stress : Not at all  Social Connections: Not on file  Intimate Partner Violence: Not At Risk   Fear of Current or Ex-Partner: No   Emotionally Abused: No   Physically Abused: No   Sexually Abused: No    Allergies  Allergen Reactions   Codeine     Makes me sick     Current Outpatient Medications  Medication Sig Dispense Refill   amLODipine (NORVASC) 5 MG tablet Take 1 tablet (5 mg total) by mouth daily. (Patient taking differently: Take 2.5 mg by mouth daily.) 90 tablet 3   aspirin 81 MG EC tablet Take 81 mg by mouth daily.     clopidogrel (PLAVIX) 75 MG tablet Take 1 tablet (75 mg total)  by mouth daily. 90 tablet 3   ezetimibe (ZETIA) 10 MG tablet TAKE 1 TABLET BY MOUTH DAILY 90 tablet 3   Multiple Vitamins-Minerals (MULTIVITAMIN ADULT PO) Take by mouth.     nitroGLYCERIN (NITROSTAT) 0.4 MG SL tablet PLACE 1 TABLET UNDER TONGUE EVERY 5 MIN AS NEEDED FOR CHEST PAIN IF NO RELIEF IN15 MIN CALL 911 (MAX 3 TABS) 25 tablet 0   Probiotic Product (PROBIOTIC ADVANCED PO) Take by mouth.     rosuvastatin (CRESTOR) 5 MG tablet TAKE 1 TABLET BY MOUTH DAILY 90 tablet 0   valACYclovir (VALTREX) 1000 MG tablet Take 1 tablet (1,000 mg total) by mouth daily. 90 tablet 3   Omega-3 Fatty Acids (FISH OIL) 1000 MG CAPS Take by mouth.     No current facility-administered medications for this visit.    REVIEW OF SYSTEMS:  '[X]'$  denotes positive finding, '[ ]'$  denotes negative finding Cardiac  Comments:  Chest pain or chest pressure: x   Shortness of breath upon exertion: x   Short of breath when lying flat:    Irregular heart rhythm:        Vascular    Pain in calf, thigh, or hip brought on by ambulation: x   Pain in feet at night that wakes you up from your sleep:     Blood clot in your veins:    Leg swelling:         Pulmonary    Oxygen at home:    Productive cough:     Wheezing:  x       Neurologic    Sudden weakness in arms or legs:     Sudden numbness in arms or legs:     Sudden onset of difficulty speaking or slurred speech:    Temporary loss of vision in one eye:     Problems with dizziness:  x       Gastrointestinal    Blood in stool:     Vomited blood:         Genitourinary    Burning when urinating:     Blood in urine:        Psychiatric    Major depression:         Hematologic    Bleeding problems:    Problems with blood clotting too easily:        Skin    Rashes or ulcers:        Constitutional    Fever or chills:      PHYSICAL  EXAM: Vitals:   03/05/22 0907  BP: 132/86  Pulse: 60  Resp: 16  Temp: 98.2 F (36.8 C)  TempSrc: Temporal  SpO2: 98%   Weight: 193 lb 9.6 oz (87.8 kg)  Height: '5\' 9"'$  (1.753 m)    GENERAL: The patient is a well-nourished male, in no acute distress. The vital signs are documented above. CARDIOVASCULAR: 2+ radial pulses bilaterally.  1-2+ right femoral and absent left femoral pulse.  I do not palpate popliteal or distal pulses bilaterally. PULMONARY: There is good air exchange  MUSCULOSKELETAL: There are no major deformities or cyanosis. NEUROLOGIC: No focal weakness or paresthesias are detected. SKIN: There are no ulcers or rashes noted. PSYCHIATRIC: The patient has a normal affect.  DATA:  With hand-held Doppler he does have monophasic flow at the dorsalis pedis and posterior tibial on the right.  He does not have any signal in the dorsalis pedis on the left and has monophasic flow in the posterior tibial on the left  MEDICAL ISSUES: Lower extremity arterial insufficiency with reportedly iliac occlusive disease by CT scan.  I had a long discussion with the patient and his partner regarding difficulty and determining relationship of his symptoms from arterial claudication versus neurogenic claudication from back disease.  His symptoms certainly are consistent with arterial claudication.  We will obtain the CT scan of his abdomen and pelvis.  I have recommended CT angiogram of abdomen pelvis and runoff for further evaluation of his lower extremity arterial insufficiency.  I did discuss the critical importance of smoking cessation.  Explained the direct relationship between this and his coronary disease and also his peripheral vascular occlusive disease.  We will obtain outpatient CT scan at [facility] and then see him in the office for further discussion.  I did explain potential for endovascular treatment should he have significant iliac disease.   Rosetta Posner, MD FACS [facility] Specialists of [facility] [phone] Pager [phone]  Addendum: After the patient left we  received a fax copy of his report from 02/21/2022 at [facility].  This was CT abdomen and pelvis.  This revealed chronic occlusion of the left common iliac artery and reconstitution of the left external iliac artery.  There is also report of severely diseased abdominal aorta with diffuse atheromatous disease  Note: Portions of this report may have been transcribed using voice recognition software.  Every effort has been made to ensure accuracy; however, inadvertent computerized transcription errors may still be present.

## 2022-03-05 NOTE — Telephone Encounter (Addendum)
Not sure why he referral was closed out. There are no notes as to why it was closed - it was done by someone within [facility]. I have re-opened the referral and sent the referral and notes over to [facility] for them to review. They will review and call the patient to schedule. Since there is a sense of urgency the patient can call to schedule his appt sooner.   [facility] Address: [address] [place], [place] [address] Phone: [phone]   Msg sent to patient to call office to schedule if he doesn't hear from them

## 2022-03-05 NOTE — Telephone Encounter (Signed)
Pt says there is a referral for him to AMB REFERRAL TO VASCULAR SURGERY/CLINIC, he has not heard anything from them, he needs to get this figured out as soon as possible. Please return a call when possible, thanks.     Callback Number: [phone]

## 2022-03-05 NOTE — Telephone Encounter (Signed)
Looks like he had appt this morning with the specialist???, called pt to see what referral is he calling about but no answer so Left VM requesting pt to call the office back

## 2022-03-07 DIAGNOSIS — M9905 Segmental and somatic dysfunction of pelvic region: Secondary | ICD-10-CM | POA: Diagnosis not present

## 2022-03-07 DIAGNOSIS — M9902 Segmental and somatic dysfunction of thoracic region: Secondary | ICD-10-CM | POA: Diagnosis not present

## 2022-03-07 DIAGNOSIS — M4326 Fusion of spine, lumbar region: Secondary | ICD-10-CM | POA: Diagnosis not present

## 2022-03-07 DIAGNOSIS — M5442 Lumbago with sciatica, left side: Secondary | ICD-10-CM | POA: Diagnosis not present

## 2022-03-07 DIAGNOSIS — M9903 Segmental and somatic dysfunction of lumbar region: Secondary | ICD-10-CM | POA: Diagnosis not present

## 2022-03-12 ENCOUNTER — Telehealth: Payer: Self-pay

## 2022-03-12 ENCOUNTER — Other Ambulatory Visit: Payer: Self-pay

## 2022-03-12 DIAGNOSIS — M4326 Fusion of spine, lumbar region: Secondary | ICD-10-CM | POA: Diagnosis not present

## 2022-03-12 DIAGNOSIS — M9902 Segmental and somatic dysfunction of thoracic region: Secondary | ICD-10-CM | POA: Diagnosis not present

## 2022-03-12 DIAGNOSIS — M9903 Segmental and somatic dysfunction of lumbar region: Secondary | ICD-10-CM | POA: Diagnosis not present

## 2022-03-12 DIAGNOSIS — M9905 Segmental and somatic dysfunction of pelvic region: Secondary | ICD-10-CM | POA: Diagnosis not present

## 2022-03-12 DIAGNOSIS — I739 Peripheral vascular disease, unspecified: Secondary | ICD-10-CM

## 2022-03-12 DIAGNOSIS — M5442 Lumbago with sciatica, left side: Secondary | ICD-10-CM | POA: Diagnosis not present

## 2022-03-12 NOTE — Telephone Encounter (Signed)
Pt called and left a voice message complaining of left leg numbness x 5 days.  He has an upcoming appt with Dr. Donnetta Hutching on 06/28.    Per Gwenette Greet, [place], pt is supposed to get a CTA Abdomen and pelvis with runoff prior to his appt with Dr. Donnetta Hutching.  Once those results come back, and depending on the results, we may be able to schedule him for an earlier visit at the office.  I called pt and left voice message since there was no answer.

## 2022-03-13 ENCOUNTER — Other Ambulatory Visit: Payer: Self-pay | Admitting: Physician Assistant

## 2022-03-13 ENCOUNTER — Ambulatory Visit (HOSPITAL_COMMUNITY)
Admission: RE | Admit: 2022-03-13 | Discharge: 2022-03-13 | Disposition: A | Discharge status: Home / Self Care | Payer: Medicare Other | Source: Ambulatory Visit | Attending: Vascular Surgery | Admitting: Vascular Surgery

## 2022-03-13 ENCOUNTER — Other Ambulatory Visit: Payer: Self-pay

## 2022-03-13 ENCOUNTER — Other Ambulatory Visit: Payer: Self-pay | Admitting: Vascular Surgery

## 2022-03-13 DIAGNOSIS — I7409 Other arterial embolism and thrombosis of abdominal aorta: Secondary | ICD-10-CM

## 2022-03-13 DIAGNOSIS — I739 Peripheral vascular disease, unspecified: Secondary | ICD-10-CM | POA: Diagnosis not present

## 2022-03-13 DIAGNOSIS — K429 Umbilical hernia without obstruction or gangrene: Secondary | ICD-10-CM | POA: Diagnosis not present

## 2022-03-13 DIAGNOSIS — I701 Atherosclerosis of renal artery: Secondary | ICD-10-CM | POA: Diagnosis not present

## 2022-03-13 DIAGNOSIS — I745 Embolism and thrombosis of iliac artery: Secondary | ICD-10-CM | POA: Diagnosis not present

## 2022-03-13 DIAGNOSIS — Z720 Tobacco use: Secondary | ICD-10-CM | POA: Diagnosis not present

## 2022-03-13 MED ORDER — IOHEXOL 350 MG/ML SOLN
100.0000 mL | Freq: Once | INTRAVENOUS | Status: AC | PRN
  Administered 2022-03-13: 100 mL via INTRAVENOUS

## 2022-03-13 MED ORDER — SODIUM CHLORIDE (PF) 0.9 % IJ SOLN
INTRAMUSCULAR | Status: AC
  Filled 2022-03-13: qty 50

## 2022-03-18 ENCOUNTER — Encounter: Payer: Self-pay | Admitting: Surgery

## 2022-03-18 ENCOUNTER — Other Ambulatory Visit: Payer: Self-pay

## 2022-03-18 ENCOUNTER — Ambulatory Visit: Payer: Medicare Other | Admitting: Surgery

## 2022-03-18 VITALS — BP 122/81 | HR 57 | Temp 98.7°F | Resp 20 | Ht 69.0 in | Wt 190.6 lb

## 2022-03-18 DIAGNOSIS — I70213 Atherosclerosis of native arteries of extremities with intermittent claudication, bilateral legs: Secondary | ICD-10-CM

## 2022-03-18 DIAGNOSIS — I7409 Other arterial embolism and thrombosis of abdominal aorta: Secondary | ICD-10-CM

## 2022-03-18 NOTE — Progress Notes (Signed)
Vascular and Vein Specialist of [facility]  Patient name: Charles Holder MRN: [id number] DOB: 04/14/1966 Sex: male     REASON FOR CONSULT:    Left buttock and leg claudication  HISTORY OF PRESENT ILLNESS:   Charles Holder is a 56 y.o. male, who was seen by Dr. Donnetta Hutching 2 weeks ago for lower extremity arterial insufficiency.  [place] an incarcerated umbilical hernia which was reduced.  He did have a CT scan that showed iliac occlusion.  He reports that he has left buttock cramping and left calf cramping with minimal activity.  He does not have any ulcers or rest pain.  He does have a history of degenerative back disease having undergone L5-S1 instrumentation.  He also reports some left leg numbness.  The patient has a history of coronary artery disease, status post PCI.  He is a current smoker.  He is on dual antiplatelet therapy with aspirin and Plavix.  He is medically managed for hypertension.  He takes a statin for hypercholesterolemia.  PAST MEDICAL HISTORY    Past Medical History:  Diagnosis Date   Adopted    Anxiety    Atrial fibrillation (HCC)    BPH (benign prostatic hyperplasia)    Cardiomyopathy, ischemic 01/22/2011   EF 50%   Chronic lower back pain    s/p L5-S1   Coronary artery disease    Depression    ED (erectile dysfunction)    Fatigue    Headache    Heart attack ([place])    Herpes genitalia    Hyperlipidemia    Hypertension    Hypogonadism in male    Neuropathy    Obesity    Prostate cancer ([place])    SOB (shortness of breath)      FAMILY HISTORY   Family History  Adopted: Yes  Problem Relation Age of Onset   Prostate cancer Neg Hx     SOCIAL HISTORY:   Social History   Socioeconomic History   Marital status: Single    Spouse name: Not on file   Number of children: Not on file   Years of education: Not on file   Highest education level: Not  on file  Occupational History   Not on file  Tobacco Use   Smoking status: Every Day    Packs/day: 1.00    Types: Cigarettes   Smokeless tobacco: Never  Vaping Use   Vaping Use: Former  Substance and Sexual Activity   Alcohol use: No   Drug use: Yes    Types: Marijuana   Sexual activity: Yes    Birth control/protection: None  Other Topics Concern   Not on file  Social History Narrative   Not on file   Social Determinants of Health   Financial Resource Strain: Low Risk    Difficulty of Paying Living Expenses: Not hard at all  Food Insecurity: No Food Insecurity   Worried About Charity fundraiser in the Last Year: Never true   Ran Out of Food in the Last Year: Never true  Transportation Needs: No Transportation Needs   Lack of Transportation (Medical): No   Lack of Transportation (Non-Medical): No  Physical Activity: Inactive   Days of Exercise per Week: 0 days   Minutes of Exercise per Session: 0 min  Stress: No Stress Concern Present   Feeling of Stress : Not at all  Social Connections: Not on file  Intimate Partner  Violence: Not At Risk   Fear of Current or Ex-Partner: No   Emotionally Abused: No   Physically Abused: No   Sexually Abused: No    ALLERGIES:    Allergies  Allergen Reactions   Codeine     Makes me sick     CURRENT MEDICATIONS:    Current Outpatient Medications  Medication Sig Dispense Refill   amLODipine (NORVASC) 5 MG tablet Take 1 tablet (5 mg total) by mouth daily. (Patient taking differently: Take 2.5 mg by mouth daily.) 90 tablet 3   aspirin 81 MG EC tablet Take 81 mg by mouth daily.     clopidogrel (PLAVIX) 75 MG tablet Take 1 tablet (75 mg total) by mouth daily. 90 tablet 3   ezetimibe (ZETIA) 10 MG tablet TAKE 1 TABLET BY MOUTH DAILY 90 tablet 3   Multiple Vitamins-Minerals (MULTIVITAMIN ADULT PO) Take by mouth.     nitroGLYCERIN (NITROSTAT) 0.4 MG SL tablet PLACE 1 TABLET UNDER TONGUE EVERY 5 MIN AS NEEDED FOR CHEST PAIN IF NO  RELIEF IN15 MIN CALL 911 (MAX 3 TABS) 25 tablet 0   Omega-3 Fatty Acids (FISH OIL) 1000 MG CAPS Take by mouth.     Probiotic Product (PROBIOTIC ADVANCED PO) Take by mouth.     rosuvastatin (CRESTOR) 5 MG tablet TAKE 1 TABLET BY MOUTH DAILY 90 tablet 0   valACYclovir (VALTREX) 1000 MG tablet Take 1 tablet (1,000 mg total) by mouth daily. 90 tablet 3   No current facility-administered medications for this visit.    REVIEW OF SYSTEMS:   '[X]'$  denotes positive finding, '[ ]'$  denotes negative finding Cardiac  Comments:  Chest pain or chest pressure:    Shortness of breath upon exertion:    Short of breath when lying flat:    Irregular heart rhythm:        Vascular    Pain in calf, thigh, or hip brought on by ambulation: x   Pain in feet at night that wakes you up from your sleep:     Blood clot in your veins:    Leg swelling:         Pulmonary    Oxygen at home:    Productive cough:     Wheezing:         Neurologic    Sudden weakness in arms or legs:     Sudden numbness in arms or legs:     Sudden onset of difficulty speaking or slurred speech:    Temporary loss of vision in one eye:     Problems with dizziness:         Gastrointestinal    Blood in stool:      Vomited blood:         Genitourinary    Burning when urinating:     Blood in urine:        Psychiatric    Major depression:         Hematologic    Bleeding problems:    Problems with blood clotting too easily:        Skin    Rashes or ulcers:        Constitutional    Fever or chills:     PHYSICAL EXAM:   Vitals:   03/18/22 1351  BP: 122/81  Pulse: (!) 57  Resp: 20  Temp: 98.7 F (37.1 C)  SpO2: 98%  Weight: 190 lb 9.6 oz (86.5 kg)  Height: '5\' 9"'$  (1.753 m)    GENERAL:  The patient is a well-nourished male, in no acute distress. The vital signs are documented above. CARDIAC: There is a regular rate and rhythm.  VASCULAR: Nonpalpable left pedal pulse, palpable dorsalis pedis on the right PULMONARY:  Nonlabored respirations ABDOMEN: Soft and non-tender with normal pitched bowel sounds.  MUSCULOSKELETAL: There are no major deformities or cyanosis. NEUROLOGIC: No focal weakness or paresthesias are detected. SKIN: There are no ulcers or rashes noted. PSYCHIATRIC: The patient has a normal affect.  STUDIES:   I have reviewed his CT scan with the following findings:   1. 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 2. Extensive heterogeneous and irregular/ulcerated fibrofatty atherosclerotic plaque throughout the infrarenal abdominal aorta. Small posterior penetrating atherosclerotic ulcer at the distal most aorta. Aortic Atherosclerosis (ICD10-I70.0). 3. Multiplicity of renal arteries bilaterally with multifocal mild to moderate stenoses. 4. Diseased small caliber inferior mesenteric artery.   NON-VASCULAR   1. No acute abnormality within the abdomen or pelvis. 2. Fat containing umbilical hernia. 3. Diffuse bronchial wall thickening suggests chronic bronchitis. 4. Additional ancillary findings as above. ASSESSMENT and PLAN   Left leg and buttock claudication: I extensively reviewed the patient's CT scan with him and his wife.  He has an occluded left common iliac artery.  There is diffuse disease throughout the infrarenal abdominal aorta, but nothing looks hemodynamically significant.  I presented 2 options to him.  The first would be aortobifemoral bypass graft, the second would be an attempt at recanalization and stenting of his occluded left iliac.  I think the most appropriate next step is for an attempt at stenting.  I discussed this and he agrees.  He understands the risks including the inability to cross the lesion, the risk of embolization, the risk of bleeding.  This has been scheduled for Tuesday, June 13.  I stressed the importance of smoking cessation as this will have a significant impact on  the long-term patency of any vascular intervention.   Leia Alf, MD, FACS [facility] Vein Specialists of [facility] [phone] Pager [phone]

## 2022-03-18 NOTE — H&P (View-Only) (Signed)
[facility]ist of [facility]  Patient name: Charles Holder MRN: [id number] DOB: 12-03-1965 Sex: male     REASON FOR CONSULT:    Left buttock and leg claudication  HISTORY OF PRESENT ILLNESS:   Charles Holder is a 56 y.o. male, who was seen by Dr. Donnetta Hutching 2 weeks ago for lower extremity arterial insufficiency.  [place] umbilical hernia which was reduced.  He did have a CT scan that showed iliac occlusion.  He reports that he has left buttock cramping and left calf cramping with minimal activity.  He does not have any ulcers or rest pain.  He does have a history of degenerative back disease having undergone L5-S1 instrumentation.  He also reports some left leg numbness.  The patient has a history of coronary artery disease, status post PCI.  He is a current smoker.  He is on dual antiplatelet therapy with aspirin and Plavix.  He is medically managed for hypertension.  He takes a statin for hypercholesterolemia.  PAST MEDICAL HISTORY    Past Medical History:  Diagnosis Date   Adopted    Anxiety    Atrial fibrillation (HCC)    BPH (benign prostatic hyperplasia)    Cardiomyopathy, ischemic 01/22/2011   EF 50%   Chronic lower back pain    s/p L5-S1   Coronary artery disease    Depression    ED (erectile dysfunction)    Fatigue    Headache    Heart attack ([place])    Herpes genitalia    Hyperlipidemia    Hypertension    Hypogonadism in male    Neuropathy    Obesity    Prostate cancer ([place])    SOB (shortness of breath)      FAMILY HISTORY   Family History  Adopted: Yes  Problem Relation Age of Onset   Prostate cancer Neg Hx     SOCIAL HISTORY:   Social History   Socioeconomic History   Marital status: Single    Spouse name: Not on file   Number of children: Not on file   Years of education: Not on file   Highest education level: Not  on file  Occupational History   Not on file  Tobacco Use   Smoking status: Every Day    Packs/day: 1.00    Types: Cigarettes   Smokeless tobacco: Never  Vaping Use   Vaping Use: Former  Substance and Sexual Activity   Alcohol use: No   Drug use: Yes    Types: Marijuana   Sexual activity: Yes    Birth control/protection: None  Other Topics Concern   Not on file  Social History Narrative   Not on file   Social Determinants of Health   Financial Resource Strain: Low Risk    Difficulty of Paying Living Expenses: Not hard at all  Food Insecurity: No Food Insecurity   Worried About Charity fundraiser in the Last Year: Never true   Ran Out of Food in the Last Year: Never true  Transportation Needs: No Transportation Needs   Lack of Transportation (Medical): No   Lack of Transportation (Non-Medical): No  Physical Activity: Inactive   Days of Exercise per Week: 0 days   Minutes of Exercise per Session: 0 min  Stress: No Stress Concern Present   Feeling of Stress : Not at all  Social Connections: Not on file  Intimate Partner  Violence: Not At Risk   Fear of Current or Ex-Partner: No   Emotionally Abused: No   Physically Abused: No   Sexually Abused: No    ALLERGIES:    Allergies  Allergen Reactions   Codeine     Makes me sick     CURRENT MEDICATIONS:    Current Outpatient Medications  Medication Sig Dispense Refill   amLODipine (NORVASC) 5 MG tablet Take 1 tablet (5 mg total) by mouth daily. (Patient taking differently: Take 2.5 mg by mouth daily.) 90 tablet 3   aspirin 81 MG EC tablet Take 81 mg by mouth daily.     clopidogrel (PLAVIX) 75 MG tablet Take 1 tablet (75 mg total) by mouth daily. 90 tablet 3   ezetimibe (ZETIA) 10 MG tablet TAKE 1 TABLET BY MOUTH DAILY 90 tablet 3   Multiple Vitamins-Minerals (MULTIVITAMIN ADULT PO) Take by mouth.     nitroGLYCERIN (NITROSTAT) 0.4 MG SL tablet PLACE 1 TABLET UNDER TONGUE EVERY 5 MIN AS NEEDED FOR CHEST PAIN IF NO  RELIEF IN15 MIN CALL 911 (MAX 3 TABS) 25 tablet 0   Omega-3 Fatty Acids (FISH OIL) 1000 MG CAPS Take by mouth.     Probiotic Product (PROBIOTIC ADVANCED PO) Take by mouth.     rosuvastatin (CRESTOR) 5 MG tablet TAKE 1 TABLET BY MOUTH DAILY 90 tablet 0   valACYclovir (VALTREX) 1000 MG tablet Take 1 tablet (1,000 mg total) by mouth daily. 90 tablet 3   No current facility-administered medications for this visit.    REVIEW OF SYSTEMS:   '[X]'$  denotes positive finding, '[ ]'$  denotes negative finding Cardiac  Comments:  Chest pain or chest pressure:    Shortness of breath upon exertion:    Short of breath when lying flat:    Irregular heart rhythm:        Vascular    Pain in calf, thigh, or hip brought on by ambulation: x   Pain in feet at night that wakes you up from your sleep:     Blood clot in your veins:    Leg swelling:         Pulmonary    Oxygen at home:    Productive cough:     Wheezing:         Neurologic    Sudden weakness in arms or legs:     Sudden numbness in arms or legs:     Sudden onset of difficulty speaking or slurred speech:    Temporary loss of vision in one eye:     Problems with dizziness:         Gastrointestinal    Blood in stool:      Vomited blood:         Genitourinary    Burning when urinating:     Blood in urine:        Psychiatric    Major depression:         Hematologic    Bleeding problems:    Problems with blood clotting too easily:        Skin    Rashes or ulcers:        Constitutional    Fever or chills:     PHYSICAL EXAM:   Vitals:   03/18/22 1351  BP: 122/81  Pulse: (!) 57  Resp: 20  Temp: 98.7 F (37.1 C)  SpO2: 98%  Weight: 190 lb 9.6 oz (86.5 kg)  Height: '5\' 9"'$  (1.753 m)    GENERAL:  The patient is a well-nourished male, in no acute distress. The vital signs are documented above. CARDIAC: There is a regular rate and rhythm.  VASCULAR: Nonpalpable left pedal pulse, palpable dorsalis pedis on the right PULMONARY:  Nonlabored respirations ABDOMEN: Soft and non-tender with normal pitched bowel sounds.  MUSCULOSKELETAL: There are no major deformities or cyanosis. NEUROLOGIC: No focal weakness or paresthesias are detected. SKIN: There are no ulcers or rashes noted. PSYCHIATRIC: The patient has a normal affect.  STUDIES:   I have reviewed his CT scan with the following findings:   1. 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 2. Extensive heterogeneous and irregular/ulcerated fibrofatty atherosclerotic plaque throughout the infrarenal abdominal aorta. Small posterior penetrating atherosclerotic ulcer at the distal most aorta. Aortic Atherosclerosis (ICD10-I70.0). 3. Multiplicity of renal arteries bilaterally with multifocal mild to moderate stenoses. 4. Diseased small caliber inferior mesenteric artery.   NON-VASCULAR   1. No acute abnormality within the abdomen or pelvis. 2. Fat containing umbilical hernia. 3. Diffuse bronchial wall thickening suggests chronic bronchitis. 4. Additional ancillary findings as above. ASSESSMENT and PLAN   Left leg and buttock claudication: I extensively reviewed the patient's CT scan with him and his wife.  He has an occluded left common iliac artery.  There is diffuse disease throughout the infrarenal abdominal aorta, but nothing looks hemodynamically significant.  I presented 2 options to him.  The first would be aortobifemoral bypass graft, the second would be an attempt at recanalization and stenting of his occluded left iliac.  I think the most appropriate next step is for an attempt at stenting.  I discussed this and he agrees.  He understands the risks including the inability to cross the lesion, the risk of embolization, the risk of bleeding.  This has been scheduled for Tuesday, June 13.  I stressed the importance of smoking cessation as this will have a significant impact on  the long-term patency of any vascular intervention.   Leia Alf, MD, FACS [facility] [phone] Pager [phone]

## 2022-03-25 ENCOUNTER — Encounter (HOSPITAL_COMMUNITY)
Admission: RE | Disposition: A | Discharge status: Home / Self Care | Payer: Self-pay | Source: Home / Self Care | Attending: Surgery

## 2022-03-25 ENCOUNTER — Other Ambulatory Visit: Payer: Self-pay

## 2022-03-25 ENCOUNTER — Ambulatory Visit (HOSPITAL_COMMUNITY)
Admission: RE | Admit: 2022-03-25 | Discharge: 2022-03-25 | Disposition: A | Discharge status: Home / Self Care | Payer: Medicare Other | Attending: Surgery | Admitting: Surgery

## 2022-03-25 DIAGNOSIS — I70212 Atherosclerosis of native arteries of extremities with intermittent claudication, left leg: Secondary | ICD-10-CM | POA: Diagnosis not present

## 2022-03-25 DIAGNOSIS — I701 Atherosclerosis of renal artery: Secondary | ICD-10-CM | POA: Diagnosis not present

## 2022-03-25 DIAGNOSIS — Z7982 Long term (current) use of aspirin: Secondary | ICD-10-CM | POA: Diagnosis not present

## 2022-03-25 DIAGNOSIS — F17210 Nicotine dependence, cigarettes, uncomplicated: Secondary | ICD-10-CM | POA: Diagnosis not present

## 2022-03-25 DIAGNOSIS — I745 Embolism and thrombosis of iliac artery: Secondary | ICD-10-CM | POA: Diagnosis not present

## 2022-03-25 DIAGNOSIS — I2510 Atherosclerotic heart disease of native coronary artery without angina pectoris: Secondary | ICD-10-CM | POA: Insufficient documentation

## 2022-03-25 DIAGNOSIS — I10 Essential (primary) hypertension: Secondary | ICD-10-CM | POA: Insufficient documentation

## 2022-03-25 DIAGNOSIS — E7800 Pure hypercholesterolemia, unspecified: Secondary | ICD-10-CM | POA: Insufficient documentation

## 2022-03-25 DIAGNOSIS — I700 Atherosclerosis of aorta: Secondary | ICD-10-CM | POA: Insufficient documentation

## 2022-03-25 DIAGNOSIS — I7409 Other arterial embolism and thrombosis of abdominal aorta: Secondary | ICD-10-CM

## 2022-03-25 DIAGNOSIS — Z7902 Long term (current) use of antithrombotics/antiplatelets: Secondary | ICD-10-CM | POA: Diagnosis not present

## 2022-03-25 HISTORY — PX: ABDOMINAL AORTOGRAM W/LOWER EXTREMITY: CATH118223

## 2022-03-25 HISTORY — PX: PERIPHERAL VASCULAR INTERVENTION: CATH118257

## 2022-03-25 LAB — POCT I-STAT, CHEM 8
BUN: 14 mg/dL (ref 6–20)
Calcium, Ion: 1.2 mmol/L (ref 1.15–1.40)
Chloride: 107 mmol/L (ref 98–111)
Creatinine, Ser: 1 mg/dL (ref 0.61–1.24)
Glucose, Bld: 91 mg/dL (ref 70–99)
HCT: 48 % (ref 39.0–52.0)
Hemoglobin: 16.3 g/dL (ref 13.0–17.0)
Potassium: 3.9 mmol/L (ref 3.5–5.1)
Sodium: 140 mmol/L (ref 135–145)
TCO2: 21 mmol/L — ABNORMAL LOW (ref 22–32)

## 2022-03-25 SURGERY — ABDOMINAL AORTOGRAM W/LOWER EXTREMITY
Anesthesia: LOCAL

## 2022-03-25 MED ORDER — MIDAZOLAM HCL 2 MG/2ML IJ SOLN
INTRAMUSCULAR | Status: AC
  Filled 2022-03-25: qty 2

## 2022-03-25 MED ORDER — SODIUM CHLORIDE 0.9% FLUSH: 3.0000 mL | INTRAVENOUS | Status: DC | PRN

## 2022-03-25 MED ORDER — SODIUM CHLORIDE 0.9 % IV SOLN: INTRAVENOUS | Status: DC

## 2022-03-25 MED ORDER — SODIUM CHLORIDE 0.9 % IV SOLN: 250.0000 mL | INTRAVENOUS | Status: DC | PRN

## 2022-03-25 MED ORDER — HEPARIN (PORCINE) IN NACL 1000-0.9 UT/500ML-% IV SOLN
INTRAVENOUS | Status: AC
Start: 2022-03-25 — End: ?
  Filled 2022-03-25: qty 1000

## 2022-03-25 MED ORDER — IODIXANOL 320 MG/ML IV SOLN
INTRAVENOUS | Status: DC | PRN
  Administered 2022-03-25: 80 mL

## 2022-03-25 MED ORDER — HEPARIN (PORCINE) IN NACL 1000-0.9 UT/500ML-% IV SOLN
INTRAVENOUS | Status: DC | PRN
  Administered 2022-03-25 (×2): 500 mL

## 2022-03-25 MED ORDER — HYDRALAZINE HCL 20 MG/ML IJ SOLN: 5.0000 mg | INTRAMUSCULAR | Status: DC | PRN

## 2022-03-25 MED ORDER — FENTANYL CITRATE (PF) 100 MCG/2ML IJ SOLN
INTRAMUSCULAR | Status: DC | PRN
Start: 2022-03-25 — End: 2022-03-25
  Administered 2022-03-25 (×2): 50 ug via INTRAVENOUS

## 2022-03-25 MED ORDER — OXYCODONE HCL 5 MG PO TABS: 5.0000 mg | ORAL_TABLET | ORAL | Status: DC | PRN

## 2022-03-25 MED ORDER — HEPARIN SODIUM (PORCINE) 1000 UNIT/ML IJ SOLN
INTRAMUSCULAR | Status: DC | PRN
  Administered 2022-03-25: 9000 [IU] via INTRAVENOUS

## 2022-03-25 MED ORDER — ACETAMINOPHEN 325 MG PO TABS: 650.0000 mg | ORAL_TABLET | ORAL | Status: DC | PRN

## 2022-03-25 MED ORDER — FENTANYL CITRATE (PF) 100 MCG/2ML IJ SOLN
INTRAMUSCULAR | Status: AC
  Filled 2022-03-25: qty 2

## 2022-03-25 MED ORDER — SODIUM CHLORIDE 0.9 % WEIGHT BASED INFUSION: 1.0000 mL/kg/h | INTRAVENOUS | Status: DC

## 2022-03-25 MED ORDER — LABETALOL HCL 5 MG/ML IV SOLN: 10.0000 mg | INTRAVENOUS | Status: DC | PRN

## 2022-03-25 MED ORDER — HEPARIN SODIUM (PORCINE) 1000 UNIT/ML IJ SOLN
INTRAMUSCULAR | Status: AC
  Filled 2022-03-25: qty 10

## 2022-03-25 MED ORDER — ONDANSETRON HCL 4 MG/2ML IJ SOLN
4.0000 mg | Freq: Four times a day (QID) | INTRAMUSCULAR | Status: DC | PRN
  Administered 2022-03-25: 4 mg via INTRAVENOUS
  Filled 2022-03-25: qty 2

## 2022-03-25 MED ORDER — LIDOCAINE HCL (PF) 1 % IJ SOLN
INTRAMUSCULAR | Status: AC
Start: 2022-03-25 — End: ?
  Filled 2022-03-25: qty 30

## 2022-03-25 MED ORDER — SODIUM CHLORIDE 0.9% FLUSH: 3.0000 mL | Freq: Two times a day (BID) | INTRAVENOUS | Status: DC

## 2022-03-25 MED ORDER — LIDOCAINE HCL (PF) 1 % IJ SOLN
INTRAMUSCULAR | Status: DC | PRN
  Administered 2022-03-25 (×2): 15 mL

## 2022-03-25 MED ORDER — HYDROMORPHONE HCL 1 MG/ML IJ SOLN
0.5000 mg | INTRAMUSCULAR | Status: DC | PRN
  Administered 2022-03-25: 1 mg via INTRAVENOUS
  Filled 2022-03-25: qty 1

## 2022-03-25 MED ORDER — MIDAZOLAM HCL 2 MG/2ML IJ SOLN
INTRAMUSCULAR | Status: DC | PRN
  Administered 2022-03-25: 1 mg via INTRAVENOUS
  Administered 2022-03-25: 2 mg via INTRAVENOUS

## 2022-03-25 SURGICAL SUPPLY — 20 items
BAG SNAP BAND KOVER 36X36 (MISCELLANEOUS) ×1 IMPLANT
CATH OMNI FLUSH 5F 65CM (CATHETERS) ×1 IMPLANT
CATH QUICKCROSS SUPP .035X90CM (MICROCATHETER) ×1 IMPLANT
DEVICE ONE SNARE 10MM (MISCELLANEOUS) ×1 IMPLANT
DEVICE VASC CLSR CELT ART 5 (Vascular Products) ×1 IMPLANT
DEVICE VASC CLSR CELT ART 7 (Vascular Products) ×1 IMPLANT
GLIDEWIRE ADV .035X260CM (WIRE) ×1 IMPLANT
GUIDEWIRE ANGLED .035X150CM (WIRE) ×2 IMPLANT
KIT ENCORE 26 ADVANTAGE (KITS) ×1 IMPLANT
KIT MICROPUNCTURE NIT STIFF (SHEATH) ×1 IMPLANT
KIT PV (KITS) ×3 IMPLANT
SHEATH BRITE TIP 7FR 35CM (SHEATH) ×1 IMPLANT
SHEATH PINNACLE 5F 10CM (SHEATH) ×2 IMPLANT
SHEATH PINNACLE 7F 10CM (SHEATH) ×1 IMPLANT
STENT VIABAHN 8X39X80 VBX (Permanent Stent) ×1 IMPLANT
STENT VIABAHNBX 8X59X80 (Permanent Stent) ×1 IMPLANT
SYR MEDRAD MARK V 150ML (SYRINGE) ×1 IMPLANT
TRANSDUCER W/STOPCOCK (MISCELLANEOUS) ×3 IMPLANT
TRAY PV CATH (CUSTOM PROCEDURE TRAY) ×3 IMPLANT
WIRE BENTSON .035X145CM (WIRE) ×1 IMPLANT

## 2022-03-25 NOTE — Progress Notes (Signed)
Pt having back pain, he has chronic back pain from accident, c/o of pain/ nausea, pt diaphoretic, VSS, bilateral groins level 0, pain meds give,

## 2022-03-25 NOTE — Interval H&P Note (Signed)
History and Physical Interval Note:  03/25/2022 9:35 AM  Charles Holder  has presented today for surgery, with the diagnosis of aorta illac.  The various methods of treatment have been discussed with the patient and family. After consideration of risks, benefits and other options for treatment, the patient has consented to  Procedure(s): ABDOMINAL AORTOGRAM W/LOWER EXTREMITY (N/A) as a surgical intervention.  The patient's history has been reviewed, patient examined, no change in status, stable for surgery.  I have reviewed the patient's chart and labs.  Questions were answered to the patient's satisfaction.     Annamarie Major

## 2022-03-25 NOTE — Op Note (Signed)
Patient name: Charles Holder MRN: [id number] DOB: [birth year] Sex: male  03/25/2022 Pre-operative Diagnosis: Left buttock and leg claudication Post-operative diagnosis:  Same Surgeon:  Annamarie Major Procedure Performed:  1.  Ultrasound-guided access, left femoral artery  2.  Ultrasound-guided access, right femoral artery  3.  Abdominal aortogram  4.  Stent, left common iliac artery  5.  Closure device x2 (Celt)  6.  Conscious sedation, 68 minutes    Indications: This is a 56 year old gentleman with left buttock and leg claudication.  He has a CT scan that shows a left iliac occlusion.  He is here today for attempted recanalization.  Procedure:  The patient was identified in the holding area and taken to room 8.  The patient was then placed supine on the table and prepped and draped in the usual sterile fashion.  A time out was called.  Conscious sedation was administered with the use of IV fentanyl and Versed under continuous physician and nurse monitoring.  Heart rate, blood pressure, and oxygen saturation were continuously monitored.  Total sedation time was 68 minutes.  Ultrasound was used to evaluate the left common femoral artery.  It was patent .  A digital ultrasound image was acquired.  A micropuncture needle was used to access the left common femoral artery under ultrasound guidance.  An 018 wire was advanced without resistance and a micropuncture sheath was placed.  The 018 wire was removed and a benson wire was placed.  The micropuncture sheath was exchanged for a 5 french sheath.  I then performed a contrast injection through the sheath which showed an occluded common iliac artery but patent external and internal iliac arteries on the left.  I then tried to cross the occlusion with a 035 Glidewire and a quick cross catheter, however could not get out of a dissection plane.  At this point I thought contralateral access was necessary  The right common femoral artery was evaluated  with ultrasound and found to be widely patent.  It was then cannulated under ultrasound guidance with a micropuncture needle.  A [place] wire was advanced without resistance followed placement of micropuncture sheath.  A 5 French sheath was then inserted and a Omni Flush catheter was placed at L1 and an abdominal aortogram was performed.  This showed a diseased aorta but no hemodynamically significant stenosis.  The left common iliac artery was occluded with reconstitution of the distal common iliac artery and patent hypogastric and external iliac artery.  No significant stenosis was identified on the right side.  I then proceeded to try and cross the lesion from the right side.  Using the Omni Flush cath and a Glidewire, I was able to Successfully cross the lesion.  I then snared the Glidewire with a 10 French snare from the sheath in the left groin.  I then establish through and through access.  A Glidewire advantage was then placed from the left side out of the right side through the snare catheter.  The snare catheter was then withdrawn into the aorta and then I directed the wire up into the aorta.  I was then able to primarily stent the common iliac artery.  I placed a VBX 8 x 39 followed by a VBX 8 x 59, treating from the origin down to the hypogastric artery.  Completion imaging was then performed which showed widely patent left iliac system.  I then closed both groins with a Celt.    Impression:  #1  Occluded left  common iliac artery, successfully crossed and stented with overlapping 8 mm VBX stents  #2  Significant atherosclerotic disease within the infrarenal aorta which did not appear to be hemodynamically significant  #3  Left renal artery stenosis  V. Annamarie Major, M.D., [facility] [place] Office: [phone] Pager:  [phone]

## 2022-03-26 ENCOUNTER — Encounter (HOSPITAL_COMMUNITY): Payer: Self-pay | Admitting: Surgery

## 2022-04-02 ENCOUNTER — Telehealth: Payer: Self-pay

## 2022-04-02 ENCOUNTER — Encounter: Payer: Self-pay | Admitting: Surgery

## 2022-04-02 NOTE — Telephone Encounter (Signed)
Called pt to gather more information regarding his MyChart message with c/o leg pain. Pt is one week s/p AGM and having some pain during the day. It is slowly improving and not worse than before his procedure. He has no c/o swelling, redness, drainage, etc. Pt has been encouraged to elevate his leg periodically throughout the day, as the pain is more noticeable during the day. He states he has been walking and cutting back on his cigarettes. He is aware he should call us back if this persists or does not continue to improve.

## 2022-04-08 ENCOUNTER — Other Ambulatory Visit: Payer: Self-pay | Admitting: *Deleted

## 2022-04-08 DIAGNOSIS — I739 Peripheral vascular disease, unspecified: Secondary | ICD-10-CM

## 2022-04-08 DIAGNOSIS — I70213 Atherosclerosis of native arteries of extremities with intermittent claudication, bilateral legs: Secondary | ICD-10-CM

## 2022-04-08 DIAGNOSIS — I7409 Other arterial embolism and thrombosis of abdominal aorta: Secondary | ICD-10-CM

## 2022-04-09 ENCOUNTER — Ambulatory Visit: Payer: Medicare Other | Admitting: Vascular Surgery

## 2022-04-16 ENCOUNTER — Other Ambulatory Visit: Payer: Self-pay | Admitting: Cardiovascular Disease

## 2022-04-16 MED ORDER — NITROGLYCERIN 0.4 MG SL SUBL: 0.4000 mg | SUBLINGUAL_TABLET | SUBLINGUAL | 0 refills | Status: DC | PRN

## 2022-04-16 NOTE — Progress Notes (Signed)
04/17/22 11:45 AM   Charles Holder 19-Feb-1966 [id number]  Referring provider:  Abner Greenspan, MD [address] [place],  [place] [address]  Chief Complaint  Patient presents with   Prostate Cancer    Urological history  1. Prostate cancer -PSA pending -low risk cT1c Gleason 3+3 prostate cancer, iPSA 5.4 dx 11/17 s/p recent confirmatory prostate biopsy 09/2017  -ProMark proteomic prognostic test on his most recent prostate biopsy specimen.  His pro [name] score was 41 which predicts a 24% chance of aggressive disease.  "Aggressive disease" is defined as unfavorable pathology in your prostate, P T3a are T3b disease, or nodal or distant metastases.  On the study, 24% risk is an 11% reduction of the risk predicted from biopsy alone -Dx 08/2016 Low Risk Prostate Cancer T1c, PSA 5.4, prostate 46 grams Gleason 3+3=6, 4 cores, all left mid and left apex, 57-83 %; +PNI in left mid medial core  -Repeat bx 09/24/17 T1c, PSA 3.6, TRUS vol 37.7 Gleason 3+3, 7/12 cores, primarily on left, 77-87% (negligible 1% in single isolated core right), positive perineural invasion -prostate MRI PI-RADS category 3 lesions in the mid gland and apex bilaterally 2021- refused repeat biopsy    2. BPH with LU TS -I PSS 7/3   3. Herpes simplex -managed with 1/2 tablet of Valtrex 1000 mg QD   4. ED -Contributing factors of age, prostate cancer, hypertension, coronary artery disease, testosterone deficiency, BPH, HLD, smoking, depression, anxiety, obesity and anticoagulation therapy -SHIM 25   HPI: Charles Holder is a 56 y.o.male who presents today for a 6 month follow-up with IPSS, PSA, SHIM, and DRE.   He recently underwent surgery for PAD.  CT angio did not identify any concerning urological findings.    He has no urinary complaints at this visit.  He is having baseline urgency.  Patient denies any modifying or aggravating factors.    He is taking the supplement, URINOZINC Prostate Plus.   Patient denies any gross hematuria, dysuria or suprapubic/flank pain.  Patient denies any fevers, chills, nausea or vomiting.    IPSS     Row Name 04/17/22 1100         International Prostate Symptom Score   How often have you had the sensation of not emptying your bladder? Not at All     How often have you had to urinate less than every two hours? About half the time     How often have you found you stopped and started again several times when you urinated? Not at All     How often have you found it difficult to postpone urination? Less than 1 in 5 times     How often have you had a weak urinary stream? Less than 1 in 5 times     How often have you had to strain to start urination? Not at All     How many times did you typically get up at night to urinate? 2 Times     Total IPSS Score 7       Quality of Life due to urinary symptoms   If you were to spend the rest of your life with your urinary condition just the way it is now how would you feel about that? Mixed              Score:  1-7 Mild 8-19 Moderate 20-35 Severe    Patient still having spontaneous erections.  He denies any pain or curvature with  erections.     SHIM     Row Name 04/17/22 1104         SHIM: Over the last 6 months:   How do you rate your confidence that you could get and keep an erection? Very High     When you had erections with sexual stimulation, how often were your erections hard enough for penetration (entering your partner)? Almost Always or Always     During sexual intercourse, how often were you able to maintain your erection after you had penetrated (entered) your partner? Almost Always or Always     During sexual intercourse, how difficult was it to maintain your erection to completion of intercourse? Not Difficult     When you attempted sexual intercourse, how often was it satisfactory for you? Almost Always or Always       SHIM Total Score   SHIM 25              Score: 1-7  Severe ED 8-11 Moderate ED 12-16 Mild-Moderate ED 17-21 Mild ED 22-25 No ED      PMH: Past Medical History:  Diagnosis Date   Adopted    Anxiety    Atrial fibrillation (HCC)    BPH (benign prostatic hyperplasia)    Cardiomyopathy, ischemic 01/22/2011   EF 50%   Chronic lower back pain    s/p L5-S1   Coronary artery disease    Depression    ED (erectile dysfunction)    Fatigue    Headache    Heart attack ([place])    Herpes genitalia    Hyperlipidemia    Hypertension    Hypogonadism in male    Neuropathy    Obesity    Prostate cancer (HCC)    SOB (shortness of breath)     Surgical History: Past Surgical History:  Procedure Laterality Date   ABDOMINAL AORTOGRAM W/LOWER EXTREMITY N/A 03/25/2022   Procedure: ABDOMINAL AORTOGRAM W/LOWER EXTREMITY;  Surgeon: Serafina Mitchell, MD;  Location: [facility];  Service: Cardiovascular;  Laterality: N/A;   back surgeries     x 4   CORONARY ANGIOPLASTY  01/22/2011   stent   PERIPHERAL VASCULAR INTERVENTION  03/25/2022   Procedure: PERIPHERAL VASCULAR INTERVENTION;  Surgeon: Serafina Mitchell, MD;  Location: [facility];  Service: Cardiovascular;;  LT Iliac    Home Medications:  Allergies as of 04/17/2022       Reactions   Codeine    Makes me sick         Medication List        Accurate as of April 17, 2022 11:45 AM. If you have any questions, ask your nurse or doctor.          amLODipine 5 MG tablet Commonly known as: NORVASC Take 1 tablet (5 mg total) by mouth daily.   aspirin EC 81 MG tablet Take 81 mg by mouth daily.   cetirizine 10 MG tablet Commonly known as: ZYRTEC Take 10 mg by mouth daily.   cholecalciferol 25 MCG (1000 UNIT) tablet Commonly known as: VITAMIN D3 Take 1,000 Units by mouth daily.   clopidogrel 75 MG tablet Commonly known as: PLAVIX Take 1 tablet (75 mg total) by mouth daily.   ezetimibe 10 MG tablet Commonly known as: ZETIA TAKE 1 TABLET BY MOUTH DAILY   MULTIVITAMIN  ADULT PO Take 1 tablet by mouth daily.   nitroGLYCERIN 0.4 MG SL tablet Commonly known as: NITROSTAT Place 1 tablet (0.4 mg  total) under the tongue every 5 (five) minutes as needed for chest pain.   PROBIOTIC ADVANCED PO Take 1 capsule by mouth daily.   rosuvastatin 5 MG tablet Commonly known as: CRESTOR TAKE 1 TABLET BY MOUTH DAILY   valACYclovir 1000 MG tablet Commonly known as: VALTREX Take 1 tablet (1,000 mg total) by mouth daily.   VITAMIN C PO Take 1 tablet by mouth daily.        Allergies:  Allergies  Allergen Reactions   Codeine     Makes me sick     Family History: Family History  Adopted: Yes  Problem Relation Age of Onset   Prostate cancer Neg Hx     Social History:  reports that he has been smoking cigarettes. He has been smoking an average of 1 pack per day. He has never used smokeless tobacco. He reports current drug use. Drug: Marijuana. He reports that he does not drink alcohol.   Physical Exam: BP 134/82   Pulse 76   Ht '5\' 9"'$  (1.753 m)   Wt 193 lb (87.5 kg)   BMI 28.50 kg/m   Constitutional:  Well nourished. Alert and oriented, No acute distress. HEENT: [place] AT, moist mucus membranes.  Trachea midline Cardiovascular: No clubbing, cyanosis, or edema. Respiratory: Normal respiratory effort, no increased work of breathing. GU: No CVA tenderness.  No bladder fullness or masses.  Patient with uncircumcised phallus.  Foreskin easily retracted  Urethral meatus is patent.  No penile discharge. No penile lesions or rashes. Scrotum without lesions, cysts, rashes and/or edema.  Testicles are located scrotally bilaterally. No masses are appreciated in the testicles. Left and right epididymis are normal. Rectal: Patient with  normal sphincter tone. Anus and perineum without scarring or rashes. No rectal masses are appreciated. Prostate is approximately 50 grams, no nodules are appreciated. Seminal vesicles could not be palpated Neurologic: Grossly intact, no  focal deficits, moving all 4 extremities. Psychiatric: Normal mood and affect.   Laboratory Data: Lab Results  Component Value Date   CREATININE 1.00 03/25/2022   Lab Results  Component Value Date   HGBA1C  01/23/2011    5.5 (NOTE)                                                                       According to the ADA Clinical Practice Recommendations for 2011, when HbA1c is used as a screening test:   >=6.5%   Diagnostic of Diabetes Mellitus           (if abnormal result  is confirmed)  5.7-6.4%   Increased risk of developing Diabetes Mellitus  References:Diagnosis and Classification of Diabetes Mellitus,Diabetes GHWE,9937,16(RCVEL 1):S62-S69 and Standards of Medical Care in         Diabetes - 2011,Diabetes Care,2011,34  (Suppl 1):S11-S61.   Component     Latest Ref Rng 02/25/2022  WBC     3.8 - 10.8 Thousand/uL 11.2 (H)   RBC     4.20 - 5.80 Million/uL 5.32   Hemoglobin     13.0 - 17.0 g/dL 16.5   HCT     39.0 - 52.0 % 48.3   MCV     80.0 - 100.0 fL 90.8   MCHC     32.0 -  36.0 g/dL 34.2   RDW     11.0 - 15.0 % 12.7   Platelets     140 - 400 Thousand/uL 275   Neutrophils     % 62   Lymphocytes     12.0 - 46.0 %   Monocytes Relative     % 7.8   Eosinophil     % 3.1   Basophil     % 0.6   NEUT#     1,500 - 7,800 cells/uL 6,944   Lymphocyte #     850 - 3,900 cells/uL 2,968   Monocyte #     0.1 - 1.0 K/uL   Eosinophils Absolute     15 - 500 cells/uL 347   Basophils Absolute     0 - 200 cells/uL 67   MCH     27.0 - 33.0 pg 31.0   MPV     7.5 - 12.5 fL 9.9   Absolute Monocytes     200 - 950 cells/uL 874   Total Lymphocyte     % 26.5   Sodium     135 - 145 mmol/L   Potassium     3.5 - 5.1 mmol/L   Chloride     98 - 111 mmol/L   BUN     6 - 20 mg/dL   Creatinine     0.61 - 1.24 mg/dL   Glucose     70 - 99 mg/dL   Calcium Ionized     1.15 - 1.40 mmol/L   TCO2     22 - 32 mmol/L     Legend: (H) High I have reviewed the labs.   Pertinent  Imaging: CLINICAL DATA:  Peripheral arterial disease   EXAM: CT ANGIOGRAPHY OF ABDOMINAL AORTA WITH ILIOFEMORAL RUNOFF   TECHNIQUE: Multidetector CT imaging of the abdomen, pelvis and lower extremities was performed using the standard protocol during bolus administration of intravenous contrast. Multiplanar CT image reconstructions and MIPs were obtained to evaluate the vascular anatomy.   RADIATION DOSE REDUCTION: This exam was performed according to the departmental dose-optimization program which includes automated exposure control, adjustment of the mA and/or kV according to patient size and/or use of iterative reconstruction technique.   CONTRAST:  147m OMNIPAQUE IOHEXOL 350 MG/ML SOLN   COMPARISON:  None Available.   FINDINGS: VASCULAR   Aorta: Extensive heterogeneous and irregular atherosclerotic plaque throughout the infrarenal abdominal aorta. Focal penetrating ulceration posteriorly in the distal most infrarenal aorta. No aneurysm or dissection.   Celiac: Patent without evidence of aneurysm, dissection, vasculitis or significant stenosis.   SMA: Patent without evidence of aneurysm, dissection, vasculitis or significant stenosis.   Renals: There are 4 right-sided renal arteries, 2 arising from the normal anatomic location, and 2 arising from the distal most abdominal aorta. At least mild narrowing of the superior most of the for renal arteries. The 2 most inferior renal arteries are too small to evaluate. On the left, there are 3 renal arteries with mild to moderate stenosis at the origins of all 3.   IMA: Diffusely diseased and small in caliber. Likely high-grade stenosis at the origin.   RIGHT Lower Extremity   Inflow: Scattered atherosclerotic plaque. No aneurysm or dissection. Moderate focal stenosis of the external iliac artery distally.   Outflow: High bifurcation of the common femoral artery. Scattered atherosclerotic plaque without high-grade  stenosis or occlusion in the superficial femoral artery. The profunda femoral and popliteal arteries are patent and unremarkable.   Runoff:  Patent three vessel runoff to the ankle.   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 The vessel is very small in caliber consistent with longstanding occlusive disease. There is reconstitution at the bifurcation. The internal and external iliac arteries are patent via retrograde flow.   Outflow: The common femoral artery is patent with only mild plaque. The profunda femoral artery is widely patent. The superficial femoral and popliteal arteries are widely patent.   Runoff: Patent 3 vessel runoff to the ankle.   Veins: No focal venous abnormality.   Review of the MIP images confirms the above findings.   NON-VASCULAR   Lower chest: No acute abnormality. Diffuse mild bronchial wall thickening. A stent is present within the circumflex coronary artery.   Hepatobiliary: No focal liver abnormality is seen. No gallstones, gallbladder wall thickening, or biliary dilatation.   Pancreas: Unremarkable. No pancreatic ductal dilatation or surrounding inflammatory changes.   Spleen: Normal in size without focal abnormality.   Adrenals/Urinary Tract: Adrenal glands are unremarkable. Kidneys are normal, without renal calculi, focal lesion, or hydronephrosis. Bladder is unremarkable.   Stomach/Bowel: Stomach is within normal limits. Appendix appears normal. No evidence of bowel wall thickening, distention, or inflammatory changes.   Lymphatic: No suspicious lymphadenopathy.   Reproductive: Prostate is unremarkable.   Other: Fat containing umbilical hernia.  No ascites.   Musculoskeletal: Surgical changes of prior L5-S1 posterior lumbar interbody fusion with interbody graft in place and successful ankylosis. No hardware complication. No acute fracture or malalignment.    IMPRESSION: VASCULAR   1. 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 2. Extensive heterogeneous and irregular/ulcerated fibrofatty atherosclerotic plaque throughout the infrarenal abdominal aorta. Small posterior penetrating atherosclerotic ulcer at the distal most aorta. Aortic Atherosclerosis (ICD10-I70.0). 3. Multiplicity of renal arteries bilaterally with multifocal mild to moderate stenoses. 4. Diseased small caliber inferior mesenteric artery.   NON-VASCULAR   1. No acute abnormality within the abdomen or pelvis. 2. Fat containing umbilical hernia. 3. Diffuse bronchial wall thickening suggests chronic bronchitis. 4. Additional ancillary findings as above.   Signed,   Criselda Peaches, MD, [place]   [facility]   [facility]     Electronically Signed   By: Jacqulynn Cadet M.D.   On: 03/14/2022 10:31 I have independently reviewed the films.  See HPI.    Assessment & Plan:    1. Prostate cancer -PSA pending  2. BPH with LU TS -continue conservative management -patient taking a supplement  3. Herpes simplex -continue Valtrex    Return in about 6 months (around 10/18/2022) for IPSS, SHIM, PSA and exam.  [facility] [address] [place], [place] [address] [phone]

## 2022-04-16 NOTE — Telephone Encounter (Signed)
Attempted to schedule.  

## 2022-04-16 NOTE — Telephone Encounter (Signed)
Patient requesting to schedule yearly F/U appointment.  Thank you!

## 2022-04-17 ENCOUNTER — Ambulatory Visit
Admission: RE | Admit: 2022-04-17 | Discharge: 2022-04-17 | Disposition: A | Discharge status: Home / Self Care | Payer: Medicare Other | Source: Ambulatory Visit | Attending: Student | Admitting: Student

## 2022-04-17 ENCOUNTER — Encounter: Payer: Self-pay | Admitting: Surgery

## 2022-04-17 ENCOUNTER — Other Ambulatory Visit: Payer: Self-pay | Admitting: Student

## 2022-04-17 ENCOUNTER — Encounter: Payer: Self-pay | Admitting: Family Medicine

## 2022-04-17 ENCOUNTER — Ambulatory Visit: Payer: Medicare Other | Admitting: Urology

## 2022-04-17 ENCOUNTER — Encounter: Payer: Self-pay | Admitting: Urology

## 2022-04-17 ENCOUNTER — Ambulatory Visit: Payer: Medicare Other

## 2022-04-17 VITALS — BP 134/82 | HR 76 | Ht 69.0 in | Wt 193.0 lb

## 2022-04-17 DIAGNOSIS — I739 Peripheral vascular disease, unspecified: Secondary | ICD-10-CM | POA: Diagnosis not present

## 2022-04-17 DIAGNOSIS — N401 Enlarged prostate with lower urinary tract symptoms: Secondary | ICD-10-CM

## 2022-04-17 DIAGNOSIS — M79661 Pain in right lower leg: Secondary | ICD-10-CM | POA: Insufficient documentation

## 2022-04-17 DIAGNOSIS — N138 Other obstructive and reflux uropathy: Secondary | ICD-10-CM

## 2022-04-17 DIAGNOSIS — W57XXXA Bitten or stung by nonvenomous insect and other nonvenomous arthropods, initial encounter: Secondary | ICD-10-CM | POA: Diagnosis not present

## 2022-04-17 DIAGNOSIS — S80862A Insect bite (nonvenomous), left lower leg, initial encounter: Secondary | ICD-10-CM | POA: Diagnosis not present

## 2022-04-17 DIAGNOSIS — S81852A Open bite, left lower leg, initial encounter: Secondary | ICD-10-CM | POA: Diagnosis not present

## 2022-04-17 DIAGNOSIS — Z8546 Personal history of malignant neoplasm of prostate: Secondary | ICD-10-CM | POA: Diagnosis not present

## 2022-04-17 DIAGNOSIS — L089 Local infection of the skin and subcutaneous tissue, unspecified: Secondary | ICD-10-CM | POA: Diagnosis not present

## 2022-04-17 DIAGNOSIS — B009 Herpesviral infection, unspecified: Secondary | ICD-10-CM | POA: Diagnosis not present

## 2022-04-17 DIAGNOSIS — C61 Malignant neoplasm of prostate: Secondary | ICD-10-CM

## 2022-04-17 DIAGNOSIS — M79662 Pain in left lower leg: Secondary | ICD-10-CM | POA: Diagnosis not present

## 2022-04-18 ENCOUNTER — Telehealth: Payer: Self-pay

## 2022-04-18 LAB — PSA: Prostate Specific Ag, Serum: 5.7 ng/mL — ABNORMAL HIGH (ref 0.0–4.0)

## 2022-04-18 NOTE — Telephone Encounter (Signed)
Called patient no answer mail box full unable to leave vmail, my chart notification sent

## 2022-04-18 NOTE — Telephone Encounter (Signed)
-----   Message from Nori Riis, PA-C sent at 04/18/2022  7:34 AM EDT ----- Please let Charles Holder know that his PSA is elevated at 5.7.  This is the highest it has been.  I recommend a repeat prostate biopsy or at the very least a repeat prostate MRI.

## 2022-04-24 ENCOUNTER — Encounter: Payer: Self-pay | Admitting: Family Medicine

## 2022-04-29 NOTE — Telephone Encounter (Signed)
Scheduled

## 2022-05-08 ENCOUNTER — Telehealth: Payer: Self-pay | Admitting: Family Medicine

## 2022-05-08 NOTE — Telephone Encounter (Signed)
Left message for patient to call back and schedule Medicare Annual Wellness Visit (AWV).   Please offer to do virtually or by telephone.   Last AWV: 05/10/2021    Please schedule at anytime with [facility] Advisor schedule 2   30  minute appointent  If any questions, please contact me at [phone]

## 2022-05-12 ENCOUNTER — Ambulatory Visit (INDEPENDENT_AMBULATORY_CARE_PROVIDER_SITE_OTHER)
Admission: RE | Admit: 2022-05-12 | Discharge: 2022-05-12 | Disposition: A | Discharge status: Home / Self Care | Payer: Medicare Other | Source: Ambulatory Visit | Attending: Vascular Surgery | Admitting: Vascular Surgery

## 2022-05-12 ENCOUNTER — Ambulatory Visit (HOSPITAL_COMMUNITY)
Admission: RE | Admit: 2022-05-12 | Discharge: 2022-05-12 | Disposition: A | Discharge status: Home / Self Care | Payer: Medicare Other | Source: Ambulatory Visit | Attending: Vascular Surgery | Admitting: Vascular Surgery

## 2022-05-12 DIAGNOSIS — I70213 Atherosclerosis of native arteries of extremities with intermittent claudication, bilateral legs: Secondary | ICD-10-CM | POA: Diagnosis not present

## 2022-05-12 DIAGNOSIS — I7409 Other arterial embolism and thrombosis of abdominal aorta: Secondary | ICD-10-CM | POA: Diagnosis not present

## 2022-05-12 DIAGNOSIS — I739 Peripheral vascular disease, unspecified: Secondary | ICD-10-CM

## 2022-05-13 ENCOUNTER — Other Ambulatory Visit: Payer: Self-pay | Admitting: Cardiovascular Disease

## 2022-05-14 DIAGNOSIS — M9901 Segmental and somatic dysfunction of cervical region: Secondary | ICD-10-CM | POA: Diagnosis not present

## 2022-05-14 DIAGNOSIS — M9903 Segmental and somatic dysfunction of lumbar region: Secondary | ICD-10-CM | POA: Diagnosis not present

## 2022-05-14 DIAGNOSIS — M9902 Segmental and somatic dysfunction of thoracic region: Secondary | ICD-10-CM | POA: Diagnosis not present

## 2022-05-14 DIAGNOSIS — M546 Pain in thoracic spine: Secondary | ICD-10-CM | POA: Diagnosis not present

## 2022-05-14 DIAGNOSIS — M4326 Fusion of spine, lumbar region: Secondary | ICD-10-CM | POA: Diagnosis not present

## 2022-05-14 DIAGNOSIS — M542 Cervicalgia: Secondary | ICD-10-CM | POA: Diagnosis not present

## 2022-05-19 ENCOUNTER — Ambulatory Visit (INDEPENDENT_AMBULATORY_CARE_PROVIDER_SITE_OTHER): Payer: Medicare Other | Admitting: Physician Assistant

## 2022-05-19 VITALS — BP 155/97 | HR 60 | Temp 97.7°F | Resp 16 | Ht 69.0 in | Wt 187.0 lb

## 2022-05-19 DIAGNOSIS — I739 Peripheral vascular disease, unspecified: Secondary | ICD-10-CM

## 2022-05-19 DIAGNOSIS — I7409 Other arterial embolism and thrombosis of abdominal aorta: Secondary | ICD-10-CM | POA: Diagnosis not present

## 2022-05-19 NOTE — Progress Notes (Signed)
Office Note     CC:  follow up Requesting Provider:  Abner Greenspan, MD  HPI: Charles Holder is a 56 y.o. (1966-09-21) male who presents status post left common iliac artery stenting by Dr. Trula Slade due to severe left buttock and leg claudication over the past 11 years.  He was found to have an occluded left iliac system which was able to be crossed and stented on 03/25/2022.  Patient states immediately his symptoms resolved.  His mobility has drastically improved.  He denies any claudication.  He is on aspirin and a statin daily.  He continues to smoke but is down to about 5 cigarettes a day.  He denies any ongoing pain, bruising, or lump of bilateral groin catheterization sites.  Past Medical History:  Diagnosis Date   Adopted    Anxiety    Atrial fibrillation (HCC)    BPH (benign prostatic hyperplasia)    Cardiomyopathy, ischemic 01/22/2011   EF 50%   Chronic lower back pain    s/p L5-S1   Coronary artery disease    Depression    ED (erectile dysfunction)    Fatigue    Headache    Heart attack ([place])    Herpes genitalia    Hyperlipidemia    Hypertension    Hypogonadism in male    Neuropathy    Obesity    Prostate cancer ([place])    SOB (shortness of breath)     Past Surgical History:  Procedure Laterality Date   ABDOMINAL AORTOGRAM W/LOWER EXTREMITY N/A 03/25/2022   Procedure: ABDOMINAL AORTOGRAM W/LOWER EXTREMITY;  Surgeon: Serafina Mitchell, MD;  Location: [facility];  Service: Cardiovascular;  Laterality: N/A;   back surgeries     x 4   CORONARY ANGIOPLASTY  01/22/2011   stent   PERIPHERAL VASCULAR INTERVENTION  03/25/2022   Procedure: PERIPHERAL VASCULAR INTERVENTION;  Surgeon: Serafina Mitchell, MD;  Location: [facility];  Service: Cardiovascular;;  LT Iliac    Social History   Socioeconomic History   Marital status: Single    Spouse name: Not on file   Number of children: Not on file   Years of education: Not on file   Highest education level: Not  on file  Occupational History   Not on file  Tobacco Use   Smoking status: Every Day    Packs/day: 1.00    Types: Cigarettes   Smokeless tobacco: Never  Vaping Use   Vaping Use: Former  Substance and Sexual Activity   Alcohol use: No   Drug use: Yes    Types: Marijuana   Sexual activity: Yes    Birth control/protection: None  Other Topics Concern   Not on file  Social History Narrative   Not on file   Social Determinants of Health   Financial Resource Strain: Low Risk  (05/10/2021)   Overall Financial Resource Strain (CARDIA)    Difficulty of Paying Living Expenses: Not hard at all  Food Insecurity: No Food Insecurity (05/10/2021)   Hunger Vital Sign    Worried About Running Out of Food in the Last Year: Never true    Wisdom in the Last Year: Never true  Transportation Needs: No Transportation Needs (05/10/2021)   PRAPARE - Hydrologist (Medical): No    Lack of Transportation (Non-Medical): No  Physical Activity: Inactive (05/10/2021)   Exercise Vital Sign    Days of Exercise per Week: 0 days  Minutes of Exercise per Session: 0 min  Stress: No Stress Concern Present (05/10/2021)   [place]    Feeling of Stress : Not at all  Social Connections: Not on file  Intimate Partner Violence: Not At Risk (05/10/2021)   Humiliation, Afraid, Rape, and Kick questionnaire    Fear of Current or Ex-Partner: No    Emotionally Abused: No    Physically Abused: No    Sexually Abused: No    Family History  Adopted: Yes  Problem Relation Age of Onset   Prostate cancer Neg Hx     Current Outpatient Medications  Medication Sig Dispense Refill   amLODipine (NORVASC) 5 MG tablet Take 1 tablet (5 mg total) by mouth daily. 90 tablet 3   Ascorbic Acid (VITAMIN C PO) Take 1 tablet by mouth daily.     aspirin 81 MG EC tablet Take 81 mg by mouth daily.     cetirizine (ZYRTEC) 10 MG tablet  Take 10 mg by mouth daily.     cholecalciferol (VITAMIN D3) 25 MCG (1000 UNIT) tablet Take 1,000 Units by mouth daily.     clopidogrel (PLAVIX) 75 MG tablet Take 1 tablet (75 mg total) by mouth daily. 90 tablet 3   ezetimibe (ZETIA) 10 MG tablet TAKE 1 TABLET BY MOUTH DAILY 90 tablet 3   Multiple Vitamins-Minerals (MULTIVITAMIN ADULT PO) Take 1 tablet by mouth daily.     nitroGLYCERIN (NITROSTAT) 0.4 MG SL tablet Place 1 tablet (0.4 mg total) under the tongue every 5 (five) minutes as needed for chest pain. 25 tablet 0   Probiotic Product (PROBIOTIC ADVANCED PO) Take 1 capsule by mouth daily.     rosuvastatin (CRESTOR) 5 MG tablet TAKE 1 TABLET BY MOUTH DAILY 90 tablet 0   valACYclovir (VALTREX) 1000 MG tablet Take 1 tablet (1,000 mg total) by mouth daily. 90 tablet 3   No current facility-administered medications for this visit.    Allergies  Allergen Reactions   Codeine     Makes me sick      REVIEW OF SYSTEMS:   '[X]'$  denotes positive finding, '[ ]'$  denotes negative finding Cardiac  Comments:  Chest pain or chest pressure:    Shortness of breath upon exertion:    Short of breath when lying flat:    Irregular heart rhythm:        Vascular    Pain in calf, thigh, or hip brought on by ambulation:    Pain in feet at night that wakes you up from your sleep:     Blood clot in your veins:    Leg swelling:         Pulmonary    Oxygen at home:    Productive cough:     Wheezing:         Neurologic    Sudden weakness in arms or legs:     Sudden numbness in arms or legs:     Sudden onset of difficulty speaking or slurred speech:    Temporary loss of vision in one eye:     Problems with dizziness:         Gastrointestinal    Blood in stool:     Vomited blood:         Genitourinary    Burning when urinating:     Blood in urine:        Psychiatric    Major depression:  Hematologic    Bleeding problems:    Problems with blood clotting too easily:        Skin     Rashes or ulcers:        Constitutional    Fever or chills:      PHYSICAL EXAMINATION:  Vitals:   05/19/22 0922  BP: (!) 155/97  Pulse: 60  Resp: 16  Temp: 97.7 F (36.5 C)  TempSrc: Temporal  SpO2: 97%  Weight: 187 lb (84.8 kg)  Height: '5\' 9"'$  (1.753 m)    General:  WDWN in NAD; vital signs documented above Gait: Not observed HENT: WNL, normocephalic Pulmonary: normal non-labored breathing , without Rales, rhonchi,  wheezing Cardiac: regular HR Abdomen: soft, NT, no masses Skin: without rashes Vascular Exam/Pulses:  Right Left  Radial 2+ (normal) 2+ (normal)  PT 2+ (normal) 2+ (normal)   Extremities: without ischemic changes, without Gangrene , without cellulitis; without open wounds;  Musculoskeletal: no muscle wasting or atrophy  Neurologic: A&O X 3;  No focal weakness or paresthesias are detected Psychiatric:  The pt has Normal affect.   Non-Invasive Vascular Imaging:   Patent left iliac stent  ABI/TBIToday's ABIToday's TBIPrevious ABIPrevious TBI  +-------+-----------+-----------+------------+------------+  Right  0.98       0.67                                 +-------+-----------+-----------+------------+------------+  Left   0.97       0.78      ASSESSMENT/PLAN:: 56 y.o. male status post left iliac artery stent by Dr. Trula Slade on 03/25/2022  -Disabling claudication of left lower extremity has completely resolved since the procedure -Bilateral groin catheterization sites are without pain or hematoma -Recheck aortoiliac duplex and ABI in 6 months -Continue aspirin and statin daily -Encouraged smoking cessation   Dagoberto Ligas, PA-C [facility] [phone]  Clinic MD:   Trula Slade

## 2022-05-23 ENCOUNTER — Other Ambulatory Visit: Payer: Self-pay

## 2022-05-23 DIAGNOSIS — I739 Peripheral vascular disease, unspecified: Secondary | ICD-10-CM

## 2022-05-23 DIAGNOSIS — I7409 Other arterial embolism and thrombosis of abdominal aorta: Secondary | ICD-10-CM

## 2022-06-09 ENCOUNTER — Other Ambulatory Visit: Payer: Self-pay | Admitting: Cardiovascular Disease

## 2022-06-09 ENCOUNTER — Other Ambulatory Visit: Payer: Self-pay | Admitting: Urology

## 2022-06-09 DIAGNOSIS — B009 Herpesviral infection, unspecified: Secondary | ICD-10-CM
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
## 2022-06-11 ENCOUNTER — Ambulatory Visit: Payer: Medicare Other | Attending: Cardiovascular Disease | Admitting: Cardiovascular Disease

## 2022-06-11 ENCOUNTER — Encounter: Payer: Self-pay | Admitting: Cardiovascular Disease

## 2022-06-11 VITALS — BP 112/62 | HR 55 | Ht 69.0 in | Wt 194.2 lb

## 2022-06-11 DIAGNOSIS — I7409 Other arterial embolism and thrombosis of abdominal aorta: Secondary | ICD-10-CM

## 2022-06-11 DIAGNOSIS — I70213 Atherosclerosis of native arteries of extremities with intermittent claudication, bilateral legs: Secondary | ICD-10-CM | POA: Diagnosis not present

## 2022-06-11 DIAGNOSIS — M79602 Pain in left arm: Secondary | ICD-10-CM | POA: Diagnosis not present

## 2022-06-11 DIAGNOSIS — F17200 Nicotine dependence, unspecified, uncomplicated: Secondary | ICD-10-CM

## 2022-06-11 DIAGNOSIS — I10 Essential (primary) hypertension: Secondary | ICD-10-CM | POA: Diagnosis not present

## 2022-06-11 DIAGNOSIS — E7800 Pure hypercholesterolemia, unspecified: Secondary | ICD-10-CM | POA: Diagnosis not present

## 2022-06-11 DIAGNOSIS — I739 Peripheral vascular disease, unspecified: Secondary | ICD-10-CM

## 2022-06-11 MED ORDER — CLOPIDOGREL BISULFATE 75 MG PO TABS: 75.0000 mg | ORAL_TABLET | Freq: Every day | ORAL | 3 refills | Status: DC

## 2022-06-11 MED ORDER — EZETIMIBE 10 MG PO TABS: 10.0000 mg | ORAL_TABLET | Freq: Every day | ORAL | 3 refills | Status: DC

## 2022-06-11 MED ORDER — AMLODIPINE BESYLATE 5 MG PO TABS: 5.0000 mg | ORAL_TABLET | Freq: Every day | ORAL | 3 refills | Status: DC

## 2022-06-11 MED ORDER — ROSUVASTATIN CALCIUM 5 MG PO TABS: 5.0000 mg | ORAL_TABLET | Freq: Every day | ORAL | 3 refills | Status: DC

## 2022-06-11 NOTE — Patient Instructions (Signed)
Medication Instructions:  No changes  If you need a refill on your cardiac medications before your next appointment, please call your pharmacy.   Lab work: No new labs needed  Testing/Procedures: No new testing needed  Follow-Up: At [facility], you and your health needs are our priority.  As part of our continuing mission to provide you with exceptional heart care, we have created designated Provider Care Teams.  These Care Teams include your primary Cardiologist (physician) and Advanced Practice Providers (APPs -  Physician Assistants and Nurse Practitioners) who all work together to provide you with the care you need, when you need it.  You will need a follow up appointment in 12 months  Providers on your designated Care Team:   Christopher Berge, NP Ryan Dunn, PA-C Cadence Furth, PA-C  COVID-19 Vaccine Information can be found at: [email or web address] For questions related to vaccine distribution or appointments, please email [email or web address] or call [phone].   

## 2022-06-17 ENCOUNTER — Other Ambulatory Visit: Payer: Self-pay | Admitting: Urology

## 2022-06-17 DIAGNOSIS — B009 Herpesviral infection, unspecified: Secondary | ICD-10-CM

## 2022-06-17 MED ORDER — VALACYCLOVIR HCL 1 G PO TABS: 1000.0000 mg | ORAL_TABLET | Freq: Every day | ORAL | 3 refills | Status: DC

## 2022-06-30 ENCOUNTER — Encounter: Payer: Self-pay | Admitting: Family Medicine

## 2022-06-30 DIAGNOSIS — M9901 Segmental and somatic dysfunction of cervical region: Secondary | ICD-10-CM | POA: Diagnosis not present

## 2022-06-30 DIAGNOSIS — M546 Pain in thoracic spine: Secondary | ICD-10-CM | POA: Diagnosis not present

## 2022-06-30 DIAGNOSIS — M9902 Segmental and somatic dysfunction of thoracic region: Secondary | ICD-10-CM | POA: Diagnosis not present

## 2022-06-30 DIAGNOSIS — M4326 Fusion of spine, lumbar region: Secondary | ICD-10-CM | POA: Diagnosis not present

## 2022-06-30 DIAGNOSIS — M9903 Segmental and somatic dysfunction of lumbar region: Secondary | ICD-10-CM | POA: Diagnosis not present

## 2022-06-30 DIAGNOSIS — M5412 Radiculopathy, cervical region: Secondary | ICD-10-CM | POA: Diagnosis not present

## 2022-07-01 ENCOUNTER — Ambulatory Visit (INDEPENDENT_AMBULATORY_CARE_PROVIDER_SITE_OTHER)
Admission: RE | Admit: 2022-07-01 | Discharge: 2022-07-01 | Disposition: A | Discharge status: Home / Self Care | Payer: Medicare Other | Source: Ambulatory Visit | Attending: Family Medicine | Admitting: Family Medicine

## 2022-07-01 ENCOUNTER — Ambulatory Visit (INDEPENDENT_AMBULATORY_CARE_PROVIDER_SITE_OTHER): Payer: Medicare Other | Admitting: Family Medicine

## 2022-07-01 ENCOUNTER — Encounter: Payer: Self-pay | Admitting: Family Medicine

## 2022-07-01 VITALS — BP 138/84 | HR 57 | Temp 97.5°F | Ht 69.0 in | Wt 189.4 lb

## 2022-07-01 DIAGNOSIS — M4326 Fusion of spine, lumbar region: Secondary | ICD-10-CM | POA: Diagnosis not present

## 2022-07-01 DIAGNOSIS — M542 Cervicalgia: Secondary | ICD-10-CM | POA: Diagnosis not present

## 2022-07-01 DIAGNOSIS — M546 Pain in thoracic spine: Secondary | ICD-10-CM

## 2022-07-01 DIAGNOSIS — M5136 Other intervertebral disc degeneration, lumbar region: Secondary | ICD-10-CM | POA: Diagnosis not present

## 2022-07-01 DIAGNOSIS — G8929 Other chronic pain: Secondary | ICD-10-CM

## 2022-07-01 DIAGNOSIS — M5440 Lumbago with sciatica, unspecified side: Secondary | ICD-10-CM

## 2022-07-01 DIAGNOSIS — M5090 Cervical disc disorder, unspecified, unspecified cervical region: Secondary | ICD-10-CM

## 2022-07-01 DIAGNOSIS — M9902 Segmental and somatic dysfunction of thoracic region: Secondary | ICD-10-CM | POA: Diagnosis not present

## 2022-07-01 DIAGNOSIS — M5412 Radiculopathy, cervical region: Secondary | ICD-10-CM | POA: Diagnosis not present

## 2022-07-01 DIAGNOSIS — M9903 Segmental and somatic dysfunction of lumbar region: Secondary | ICD-10-CM | POA: Diagnosis not present

## 2022-07-01 DIAGNOSIS — M4316 Spondylolisthesis, lumbar region: Secondary | ICD-10-CM | POA: Diagnosis not present

## 2022-07-01 DIAGNOSIS — M545 Low back pain, unspecified: Secondary | ICD-10-CM | POA: Diagnosis not present

## 2022-07-01 DIAGNOSIS — M40204 Unspecified kyphosis, thoracic region: Secondary | ICD-10-CM | POA: Diagnosis not present

## 2022-07-01 DIAGNOSIS — M9901 Segmental and somatic dysfunction of cervical region: Secondary | ICD-10-CM | POA: Diagnosis not present

## 2022-07-01 DIAGNOSIS — R202 Paresthesia of skin: Secondary | ICD-10-CM | POA: Diagnosis not present
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
## 2022-07-01 NOTE — Assessment & Plan Note (Signed)
Has been told has DDD. Exam today reassuring but he does endorse recent left arm paresthesias. Check baseline cervical films. Offered short prednisone course for treatment - he declines. Rec ice/heat, tylenol (avoiding NSAIDs in blood thinner use), gentle stretching of neck. Update if not improving with treatment.

## 2022-07-01 NOTE — Assessment & Plan Note (Addendum)
Update lumbar spine films.  He declines steroid course, muscle relaxant course, opiate.

## 2022-07-01 NOTE — Assessment & Plan Note (Signed)
Chronic issue, will check baseline thoracic spine films.

## 2022-07-01 NOTE — Patient Instructions (Addendum)
Xrays today of neck and thoracic spine.  Anticipate component of wear and tear arthritis causing some of this pain, possible pinched nerve in neck causing tingling.  Ask for CDs of imaging today.  Let us know if you'd like to try steroid course for neck pain or need refill of muscle relaxant.

## 2022-07-03 ENCOUNTER — Encounter: Payer: Self-pay | Admitting: Family Medicine

## 2022-07-28 ENCOUNTER — Telehealth: Payer: Self-pay | Admitting: Family Medicine

## 2022-07-28 NOTE — Telephone Encounter (Signed)
Left message for patient to call back and schedule Medicare Annual Wellness Visit (AWV) either virtually or phone   Left  my jabber number [phone]   Last AWV 05/10/21   30 min for awv-s  phone/virtual appointments

## 2022-09-08 ENCOUNTER — Telehealth: Payer: Self-pay | Admitting: Family Medicine

## 2022-09-08 NOTE — Telephone Encounter (Signed)
No answer when calling to schedule AWV with [facility]. I will call patient back in a few days.

## 2022-10-20 NOTE — Progress Notes (Unsigned)
10/21/22 9:54 AM   Charles Holder [birth year] [id number]  Referring provider:  Abner Greenspan, MD [address] [place],  [place] [address]  Urological history  1. Prostate cancer -PSA pending -low risk cT1c Gleason 3+3 prostate cancer, iPSA 5.4 dx 11/17 s/p recent confirmatory prostate biopsy 09/2017  -ProMark proteomic prognostic test on his most recent prostate biopsy specimen.  His pro [name] score was 41 which predicts a 24% chance of aggressive disease.  "Aggressive disease" is defined as unfavorable pathology in your prostate, P T3a are T3b disease, or nodal or distant metastases.  On the study, 24% risk is an 11% reduction of the risk predicted from biopsy alone -Dx 08/2016 Low Risk Prostate Cancer T1c, PSA 5.4, prostate 46 grams Gleason 3+3=6, 4 cores, all left mid and left apex, 57-83 %; +PNI in left mid medial core  -Repeat bx 09/24/17 T1c, PSA 3.6, TRUS vol 37.7 Gleason 3+3, 7/12 cores, primarily on left, 77-87% (negligible 1% in single isolated core right), positive perineural invasion -prostate MRI PI-RADS category 3 lesions in the mid gland and apex bilaterally 2021- refused repeat biopsy    2. BPH with LU TS -I PSS 8/2   3. Herpes simplex -managed with 1/2 tablet of Valtrex 1000 mg QD   4. ED -Contributing factors of age, prostate cancer, hypertension, coronary artery disease, testosterone deficiency, BPH, HLD, smoking, depression, anxiety, obesity and anticoagulation therapy -SHIM 24  HPI: Charles Holder is a 57 y.o.male who presents today for a 6 month follow-up with IPSS, PSA, SHIM, and DRE.    He has nocturia x 2.  Patient denies any modifying or aggravating factors.  Patient denies any gross hematuria, dysuria or suprapubic/flank pain.  Patient denies any fevers, chills, nausea or vomiting.     IPSS     Row Name 10/21/22 0900         International Prostate Symptom Score   How often have you had the sensation of not emptying your bladder? Not at  All     How often have you had to urinate less than every two hours? Less than half the time     How often have you found you stopped and started again several times when you urinated? Less than 1 in 5 times     How often have you found it difficult to postpone urination? Less than 1 in 5 times     How often have you had a weak urinary stream? Less than 1 in 5 times     How often have you had to strain to start urination? Less than 1 in 5 times     How many times did you typically get up at night to urinate? 2 Times     Total IPSS Score 8       Quality of Life due to urinary symptoms   If you were to spend the rest of your life with your urinary condition just the way it is now how would you feel about that? Mostly Satisfied               Score:  1-7 Mild 8-19 Moderate 20-35 Severe   Patient still having spontaneous erections.  He denies any pain or curvature with erections.  He has noticed a decrease in sexual encounters, but he is happy in his relationship.      SHIM     Row Name 10/21/22 0912         SHIM: Over the last  6 months:   How do you rate your confidence that you could get and keep an erection? Very High     When you had erections with sexual stimulation, how often were your erections hard enough for penetration (entering your partner)? Almost Always or Always     During sexual intercourse, how often were you able to maintain your erection after you had penetrated (entered) your partner? Most Times (much more than half the time)     During sexual intercourse, how difficult was it to maintain your erection to completion of intercourse? Not Difficult     When you attempted sexual intercourse, how often was it satisfactory for you? Almost Always or Always       SHIM Total Score   SHIM 24               Score: 1-7 Severe ED 8-11 Moderate ED 12-16 Mild-Moderate ED 17-21 Mild ED 22-25 No ED      PMH: Past Medical History:  Diagnosis Date   Adopted     Anxiety    Atrial fibrillation (HCC)    BPH (benign prostatic hyperplasia)    Cardiomyopathy, ischemic 01/22/2011   EF 50%   Chronic lower back pain    s/p L5-S1   Coronary artery disease    Depression    ED (erectile dysfunction)    Fatigue    Headache    Heart attack ([place])    Herpes genitalia    Hyperlipidemia    Hypertension    Hypogonadism in male    Neuropathy    Obesity    Prostate cancer (HCC)    SOB (shortness of breath)     Surgical History: Past Surgical History:  Procedure Laterality Date   ABDOMINAL AORTOGRAM W/LOWER EXTREMITY N/A 03/25/2022   Procedure: ABDOMINAL AORTOGRAM W/LOWER EXTREMITY;  Surgeon: Serafina Mitchell, MD;  Location: [facility];  Service: Cardiovascular;  Laterality: N/A;   back surgeries     x 4   CORONARY ANGIOPLASTY  01/22/2011   stent   PERIPHERAL VASCULAR INTERVENTION  03/25/2022   Procedure: PERIPHERAL VASCULAR INTERVENTION;  Surgeon: Serafina Mitchell, MD;  Location: [facility];  Service: Cardiovascular;;  LT Iliac    Home Medications:  Allergies as of 10/21/2022       Reactions   Codeine    Makes me sick         Medication List        Accurate as of October 21, 2022  9:54 AM. If you have any questions, ask your nurse or doctor.          amLODipine 5 MG tablet Commonly known as: NORVASC Take 1 tablet (5 mg total) by mouth daily.   aspirin EC 81 MG tablet Take 81 mg by mouth daily.   cetirizine 10 MG tablet Commonly known as: ZYRTEC Take 10 mg by mouth daily.   cholecalciferol 25 MCG (1000 UNIT) tablet Commonly known as: VITAMIN D3 Take 1,000 Units by mouth daily.   clopidogrel 75 MG tablet Commonly known as: PLAVIX Take 1 tablet (75 mg total) by mouth daily.   ezetimibe 10 MG tablet Commonly known as: ZETIA Take 1 tablet (10 mg total) by mouth daily.   MULTIVITAMIN ADULT PO Take 1 tablet by mouth daily.   nitroGLYCERIN 0.4 MG SL tablet Commonly known as: NITROSTAT Place 1 tablet (0.4 mg  total) under the tongue every 5 (five) minutes as needed for chest pain.   PROBIOTIC ADVANCED  PO Take 1 capsule by mouth daily.   rosuvastatin 5 MG tablet Commonly known as: CRESTOR Take 1 tablet (5 mg total) by mouth daily.   valACYclovir 1000 MG tablet Commonly known as: VALTREX Take 1 tablet (1,000 mg total) by mouth daily.   VITAMIN C PO Take 1 tablet by mouth daily.        Allergies:  Allergies  Allergen Reactions   Codeine     Makes me sick     Family History: Family History  Adopted: Yes  Problem Relation Age of Onset   Prostate cancer Neg Hx     Social History:  reports that he has been smoking cigarettes. He has been smoking an average of 1 pack per day. He has never used smokeless tobacco. He reports current drug use. Drug: Marijuana. He reports that he does not drink alcohol.   Physical Exam: BP (!) 144/86   Pulse 71   Ht '5\' 9"'$  (1.753 m)   Wt 189 lb (85.7 kg)   BMI 27.91 kg/m   Constitutional:  Well nourished. Alert and oriented, No acute distress. HEENT: [place] AT, moist mucus membranes.  Trachea midline Cardiovascular: No clubbing, cyanosis, or edema. Respiratory: Normal respiratory effort, no increased work of breathing. GU: No CVA tenderness.  No bladder fullness or masses.  Patient with uncircumcised phallus. Foreskin easily retracted  Urethral meatus is patent.  No penile discharge. No penile lesions or rashes. Scrotum without lesions, cysts, rashes and/or edema.  Testicles are located scrotally bilaterally. No masses are appreciated in the testicles. Left and right epididymis are normal. Rectal: Patient with  normal sphincter tone. Anus and perineum without scarring or rashes. No rectal masses are appreciated. Prostate is approximately 50 grams, no nodules are appreciated.  External hemorrhoids.  Seminal vesicles could not be palpated.   Neurologic: Grossly intact, no focal deficits, moving all 4 extremities. Psychiatric: Normal mood and affect.    Laboratory Data: pending  Pertinent Imaging N/A  Assessment & Plan:    1. Prostate cancer -PSA pending  2. BPH with LU TS -continue conservative management -patient taking a supplement  3. Herpes simplex -continue Valtrex    Return in about 6 months (around 04/21/2023) for IPSS, SHIM, PSA and exam.  [facility] [address] [place], [place] [address] [phone]

## 2022-10-21 ENCOUNTER — Ambulatory Visit: Payer: Medicare Other | Admitting: Urology

## 2022-10-21 ENCOUNTER — Encounter: Payer: Self-pay | Admitting: Urology

## 2022-10-21 VITALS — BP 144/86 | HR 71 | Ht 69.0 in | Wt 189.0 lb

## 2022-10-21 DIAGNOSIS — B009 Herpesviral infection, unspecified: Secondary | ICD-10-CM | POA: Diagnosis not present

## 2022-10-21 DIAGNOSIS — N138 Other obstructive and reflux uropathy: Secondary | ICD-10-CM

## 2022-10-21 DIAGNOSIS — C61 Malignant neoplasm of prostate: Secondary | ICD-10-CM | POA: Diagnosis not present

## 2022-10-21 DIAGNOSIS — N401 Enlarged prostate with lower urinary tract symptoms: Secondary | ICD-10-CM

## 2022-10-22 ENCOUNTER — Telehealth: Payer: Self-pay | Admitting: Family Medicine

## 2022-10-22 DIAGNOSIS — C61 Malignant neoplasm of prostate: Secondary | ICD-10-CM

## 2022-10-22 LAB — PSA: Prostate Specific Ag, Serum: 4.2 ng/mL — ABNORMAL HIGH (ref 0.0–4.0)

## 2022-10-22 NOTE — Telephone Encounter (Signed)
-----   Message from Nori Riis, PA-C sent at 10/22/2022  8:12 AM EST ----- Please let [name] know that his PSA remains elevated at 4.2.  I would advise a prostate MRI at this time.

## 2022-10-22 NOTE — Telephone Encounter (Signed)
LMOM for patient to return call.

## 2022-10-28 ENCOUNTER — Telehealth: Payer: Self-pay | Admitting: Family Medicine

## 2022-10-28 NOTE — Telephone Encounter (Signed)
LVM for pt to rtn my call to schedule AWV with [facility] call back # [phone] 

## 2022-11-04 ENCOUNTER — Encounter: Payer: Self-pay | Admitting: Family Medicine

## 2022-11-04 NOTE — Telephone Encounter (Signed)
Patient notified and voiced understanding. He is willing to do the prostate mri

## 2022-11-05 NOTE — Addendum Note (Signed)
Addended by: Chrystie Nose on: 11/05/2022 01:50 PM   Modules accepted: Orders

## 2022-11-11 ENCOUNTER — Ambulatory Visit
Admission: RE | Admit: 2022-11-11 | Discharge: 2022-11-11 | Disposition: A | Discharge status: Home / Self Care | Payer: Medicare Other | Source: Ambulatory Visit | Attending: Urology | Admitting: Urology

## 2022-11-11 DIAGNOSIS — C61 Malignant neoplasm of prostate: Secondary | ICD-10-CM | POA: Insufficient documentation

## 2022-11-11 MED ORDER — GADOBUTROL 1 MMOL/ML IV SOLN
7.5000 mL | Freq: Once | INTRAVENOUS | Status: AC | PRN
  Administered 2022-11-11: 7.5 mL via INTRAVENOUS

## 2022-11-12 ENCOUNTER — Telehealth: Payer: Self-pay

## 2022-11-12 NOTE — Telephone Encounter (Signed)
Left detailed message on voicemail with results ok per DPR on file.

## 2022-11-12 NOTE — Telephone Encounter (Signed)
-----  Message from Nori Riis, PA-C sent at 11/12/2022  9:30 AM EST ----- Please let Mr. Pryor know that his prostate mri is stable.  Nothing to do at this time.  We will see him July.

## 2022-11-17 DIAGNOSIS — L821 Other seborrheic keratosis: Secondary | ICD-10-CM | POA: Diagnosis not present

## 2022-11-17 DIAGNOSIS — L570 Actinic keratosis: Secondary | ICD-10-CM | POA: Diagnosis not present

## 2023-02-11 ENCOUNTER — Telehealth: Payer: Self-pay

## 2023-02-11 ENCOUNTER — Encounter: Payer: Self-pay | Admitting: Internal Medicine

## 2023-02-11 ENCOUNTER — Ambulatory Visit (INDEPENDENT_AMBULATORY_CARE_PROVIDER_SITE_OTHER): Payer: Medicare Other | Admitting: Internal Medicine

## 2023-02-11 VITALS — BP 112/80 | HR 52 | Temp 97.5°F | Ht 69.0 in | Wt 194.0 lb

## 2023-02-11 DIAGNOSIS — K429 Umbilical hernia without obstruction or gangrene: Secondary | ICD-10-CM

## 2023-02-11 NOTE — Progress Notes (Signed)
Subjective:    Patient ID: Charles Holder, male    DOB: 12/30/1965, 57 y.o.   MRN: [id number]  HPI Here due to symptoms with an umbilical hernia  Has had it from many years Noticed last night it was much bigger--and also in the past few weeks Relates it to doing more lifting, etc Can be tender if pushed--but not overly painful Doesn't go back in  Current Outpatient Medications on File Prior to Visit  Medication Sig Dispense Refill   amLODipine (NORVASC) 5 MG tablet Take 1 tablet (5 mg total) by mouth daily. 90 tablet 3   Ascorbic Acid (VITAMIN C PO) Take 1 tablet by mouth daily.     aspirin 81 MG EC tablet Take 81 mg by mouth daily.     cetirizine (ZYRTEC) 10 MG tablet Take 10 mg by mouth daily.     cholecalciferol (VITAMIN D3) 25 MCG (1000 UNIT) tablet Take 1,000 Units by mouth daily.     clopidogrel (PLAVIX) 75 MG tablet Take 1 tablet (75 mg total) by mouth daily. 90 tablet 3   ezetimibe (ZETIA) 10 MG tablet Take 1 tablet (10 mg total) by mouth daily. 90 tablet 3   Multiple Vitamins-Minerals (MULTIVITAMIN ADULT PO) Take 1 tablet by mouth daily.     nitroGLYCERIN (NITROSTAT) 0.4 MG SL tablet Place 1 tablet (0.4 mg total) under the tongue every 5 (five) minutes as needed for chest pain. 25 tablet 0   Probiotic Product (PROBIOTIC ADVANCED PO) Take 1 capsule by mouth daily.     rosuvastatin (CRESTOR) 5 MG tablet Take 1 tablet (5 mg total) by mouth daily. 90 tablet 3   valACYclovir (VALTREX) 1000 MG tablet Take 1 tablet (1,000 mg total) by mouth daily. 90 tablet 3   No current facility-administered medications on file prior to visit.    Allergies  Allergen Reactions   Codeine     Makes me sick     Past Medical History:  Diagnosis Date   Adopted    Anxiety    Atrial fibrillation (HCC)    BPH (benign prostatic hyperplasia)    Cardiomyopathy, ischemic 01/22/2011   EF 50%   Chronic lower back pain    s/p L5-S1   Coronary artery disease    Depression    ED (erectile  dysfunction)    Fatigue    Headache    Heart attack (HCC)    Herpes genitalia    Hyperlipidemia    Hypertension    Hypogonadism in male    Neuropathy    Obesity    Prostate cancer (HCC)    SOB (shortness of breath)     Past Surgical History:  Procedure Laterality Date   ABDOMINAL AORTOGRAM W/LOWER EXTREMITY N/A 03/25/2022   Procedure: ABDOMINAL AORTOGRAM W/LOWER EXTREMITY;  Surgeon: Nada Libman, MD;  Location: [facility];  Service: Cardiovascular;  Laterality: N/A;   back surgeries     x 4   CORONARY ANGIOPLASTY  01/22/2011   stent   PERIPHERAL VASCULAR INTERVENTION  03/25/2022   Procedure: PERIPHERAL VASCULAR INTERVENTION;  Surgeon: Nada Libman, MD;  Location: [facility];  Service: Cardiovascular;;  LT Iliac    Family History  Adopted: Yes  Problem Relation Age of Onset   Prostate cancer Neg Hx     Social History   Socioeconomic History   Marital status: Single    Spouse name: Not on file   Number of children: Not on file   Years of  education: Not on file   Highest education level: Not on file  Occupational History   Not on file  Tobacco Use   Smoking status: Every Day    Packs/day: 1    Types: Cigarettes   Smokeless tobacco: Never  Vaping Use   Vaping Use: Former  Substance and Sexual Activity   Alcohol use: No   Drug use: Yes    Types: Marijuana   Sexual activity: Yes    Birth control/protection: None  Other Topics Concern   Not on file  Social History Narrative   Not on file   Social Determinants of Health   Financial Resource Strain: Low Risk  (05/10/2021)   Overall Financial Resource Strain (CARDIA)    Difficulty of Paying Living Expenses: Not hard at all  Food Insecurity: No Food Insecurity (05/10/2021)   Hunger Vital Sign    Worried About Running Out of Food in the Last Year: Never true    Ran Out of Food in the Last Year: Never true  Transportation Needs: No Transportation Needs (05/10/2021)   PRAPARE - Therapist, art (Medical): No    Lack of Transportation (Non-Medical): No  Physical Activity: Inactive (05/10/2021)   Exercise Vital Sign    Days of Exercise per Week: 0 days    Minutes of Exercise per Session: 0 min  Stress: No Stress Concern Present (05/10/2021)   [employer] of Occupational Health - Occupational Stress Questionnaire    Feeling of Stress : Not at all  Social Connections: Not on file  Intimate Partner Violence: Not At Risk (05/10/2021)   Humiliation, Afraid, Rape, and Kick questionnaire    Fear of Current or Ex-Partner: No    Emotionally Abused: No    Physically Abused: No    Sexually Abused: No   Review of Systems No N/V Bowels are fine Appetite is good     Objective:   Physical Exam Constitutional:      Appearance: Normal appearance.  Abdominal:     Comments: Partially reducible umbilical hernia Mild tenderness  Neurological:     Mental Status: He is alert.            Assessment & Plan:

## 2023-02-11 NOTE — Telephone Encounter (Signed)
Okay---- I will see him then 

## 2023-02-11 NOTE — Telephone Encounter (Signed)
I spoke with Grover Canavan (DPR signed) pt is still sleeping; pt was up and down last night. Scheduled appt with Dr Alphonsus Sias 02/11/23 at 10:45 with UC & ED precautions and Grover Canavan voiced understanding. Sending note to Dr Alphonsus Sias and Alphonsus Sias pool.

## 2023-02-11 NOTE — Telephone Encounter (Signed)
Patient Name: Charles Holder Gender: Male DOB: Jul 23, 1966 Age: 57 Y 5 M 8 D Return Phone Number: [phone] (Primary), [phone] (Secondary) Address[place]  [address] Client [facility] Night - Client Client Site [facility] - Night Provider Roxy Manns - MD Contact Type Call Who Is Calling Patient / Member / Family / Caregiver Call Type Triage / Clinical Caller Name Reita Cliche Relationship To Patient Spouse Return Phone Number [phone] (Primary) Chief Complaint Hernia Symptoms Reason for Call Symptomatic / Request for Health Information Initial Comment Caller states her partner has an umbilical hernia and it is protruding. She states normally it goes down by itself, but its this time it has not. He is having pain. Translation No Nurse Assessment Nurse: Nunzio Cory, RN, Sherrie Date/Time (Eastern Time): 02/10/2023 5:12:35 PM Confirm and document reason for call. If symptomatic, describe symptoms. ---Caller states partner has an umbilical hernia. States was protruding all around the belly button. States was able to reduce hernia it by > than 1/2. Still with pain from hernia which is unusual. States pain has subsided but lying on the floor. Skin is bluish on the top of belly button where some of the hernia was. Does the patient have any new or worsening symptoms? ---Yes Will a triage be completed? ---Yes Related visit to physician within the last 2 weeks? ---No Does the PT have any chronic conditions? (i.e. diabetes, asthma, this includes High risk factors for pregnancy, etc.) ---Yes List chronic conditions. ---Umbilical hernia, CA of prostate, HTN, High Cholesterol, Blood Thinner (plavix) Is this a behavioral health or substance abuse call? ---No  Guidelines Guideline Title Affirmed Question Affirmed Notes Nurse Date/Time (Eastern Time) Hernia Can't reduce the hernia (NO pain, local  tenderness, or vomiting) Nunzio Cory, RN, Sherrie 02/10/2023 5:22:12 PM PLEASE NOTE: All timestamps contained within this report are represented as [place] Standard Time. CONFIDENTIALTY NOTICE: This fax transmission is intended only for the addressee. It contains information that is legally privileged, confidential or otherwise protected from use or disclosure. If you are not the intended recipient, you are strictly prohibited from reviewing, disclosing, copying using or disseminating any of this information or taking any action in reliance on or regarding this information. If you have received this fax in error, please notify us immediately by telephone so that we can arrange for its return to [place]. Phone: [phone], Toll-Free: [phone], Fax: [phone] Page: 2 of 2 Call Id: [id number] Disp. Time Lamount Cohen Time) Disposition Final User 02/10/2023 5:24:54 PM See PCP within 24 Hours Yes Nunzio Cory, RN, Sherrie Final Disposition 02/10/2023 5:24:54 PM See PCP within 24 Hours Yes Nunzio Cory, RN, Sherrie Caller Disagree/Comply Comply Caller Understands Yes PreDisposition Home Care Care Advice Given Per Guideline SEE PCP WITHIN 24 HOURS: * IF OFFICE WILL BE OPEN: You need to be examined within the next 24 hours. Call your doctor (or NP/PA) when the office opens and make an appointment. AVOID STRAINING: * Avoid straining from constipation * Avoid straining from heavy lifting. CALL BACK IF: * Hernia becomes painful or tender to touch * Abdomen pain or vomiting occurs * You become worse Referrals REFERRED TO PCP OFFIC

## 2023-02-11 NOTE — Assessment & Plan Note (Signed)
He is still symptomatic  Was told by [facility] surgeon that she would not operate till he stopped smoking--this doesn't make sense Will refer here in [place]

## 2023-02-16 ENCOUNTER — Ambulatory Visit: Payer: Medicare Other | Admitting: Surgery

## 2023-02-16 ENCOUNTER — Encounter: Payer: Self-pay | Admitting: Surgery

## 2023-02-16 ENCOUNTER — Telehealth: Payer: Self-pay | Admitting: Cardiovascular Disease

## 2023-02-16 ENCOUNTER — Telehealth: Payer: Self-pay

## 2023-02-16 VITALS — BP 167/90 | HR 60 | Temp 98.2°F | Ht 69.0 in | Wt 190.6 lb

## 2023-02-16 DIAGNOSIS — K420 Umbilical hernia with obstruction, without gangrene: Secondary | ICD-10-CM

## 2023-02-16 NOTE — Progress Notes (Signed)
02/16/2023  Reason for Visit: Umbilical hernia  Requesting Provider: Tillman Abide, MD  History of Present Illness: Charles Holder is a 57 y.o. male presenting for evaluation of an umbilical hernia.  The patient reports he's had this hernia for many years.  It had not really given him any symptoms in the past and was told in the past that if it was not bothering him, he did not need to do any surgery for it.  The hernia typically is always bulging out but is otherwise asymptomatic unless it's bumped or hit.  However, more recently, over the past two weeks, he's been experiencing more pain at the umbilicus.  He reports that the hernia has been bulging more, and when it does, it hurts him more.  Has had three episodes of this recently.  He's been able to push it down to its baseline bulging.  He does do heavy lifting and has been recently working on landscaping at home and has been lifting heavy buckets of rocks for his driveway/pathway.  Denies any nausea, vomiting, constipation, diarrhea.  Of note, he smokes about less than a pack per day.  He does have a cardiac history with an MI in the past and has required a cardiac stent in 2012.  More recently, he's also had issues with PVD and has required stents with vascular surgery as well.  He's on ASA and Plavix.  Past Medical History: Past Medical History:  Diagnosis Date   Adopted    Anxiety    Atrial fibrillation (HCC)    BPH (benign prostatic hyperplasia)    Cardiomyopathy, ischemic 01/22/2011   EF 50%   Chronic lower back pain    s/p L5-S1   Coronary artery disease    Depression    ED (erectile dysfunction)    Fatigue    Headache    Heart attack (HCC)    Herpes genitalia    Hyperlipidemia    Hypertension    Hypogonadism in male    Neuropathy    Obesity    Prostate cancer (HCC)    SOB (shortness of breath)      Past Surgical History: Past Surgical History:  Procedure Laterality Date   ABDOMINAL AORTOGRAM W/LOWER EXTREMITY N/A  03/25/2022   Procedure: ABDOMINAL AORTOGRAM W/LOWER EXTREMITY;  Surgeon: Nada Libman, MD;  Location: [facility];  Service: Cardiovascular;  Laterality: N/A;   back surgeries     x 4   CORONARY ANGIOPLASTY  01/22/2011   stent   PERIPHERAL VASCULAR INTERVENTION  03/25/2022   Procedure: PERIPHERAL VASCULAR INTERVENTION;  Surgeon: Nada Libman, MD;  Location: [facility];  Service: Cardiovascular;;  LT Iliac    Home Medications: Prior to Admission medications   Medication Sig Start Date End Date Taking? Authorizing Provider  amLODipine (NORVASC) 5 MG tablet Take 1 tablet (5 mg total) by mouth daily. 06/11/22  Yes Gollan, Tollie Pizza, MD  Ascorbic Acid (VITAMIN C PO) Take 1 tablet by mouth daily.   Yes [provider]  aspirin 81 MG EC tablet Take 81 mg by mouth daily.   Yes [provider]  cetirizine (ZYRTEC) 10 MG tablet Take 10 mg by mouth daily.   Yes [provider]  cholecalciferol (VITAMIN D3) 25 MCG (1000 UNIT) tablet Take 1,000 Units by mouth daily.   Yes [provider]  clopidogrel (PLAVIX) 75 MG tablet Take 1 tablet (75 mg total) by mouth daily. 06/11/22  Yes Antonieta Iba, MD  ezetimibe (ZETIA) 10 MG tablet Take 1 tablet (10 mg total) by mouth daily. 06/11/22  Yes Antonieta Iba, MD  Multiple Vitamins-Minerals (MULTIVITAMIN ADULT PO) Take 1 tablet by mouth daily.   Yes [provider]  nitroGLYCERIN (NITROSTAT) 0.4 MG SL tablet Place 1 tablet (0.4 mg total) under the tongue every 5 (five) minutes as needed for chest pain. 04/16/22  Yes Antonieta Iba, MD  Probiotic Product (PROBIOTIC ADVANCED PO) Take 1 capsule by mouth daily.   Yes [provider]  rosuvastatin (CRESTOR) 5 MG tablet Take 1 tablet (5 mg total) by mouth daily. 06/11/22  Yes Gollan, Tollie Pizza, MD  valACYclovir (VALTREX) 1000 MG tablet Take 1 tablet (1,000 mg total) by mouth daily. 06/17/22  Yes McGowan, Wellington Hampshire, PA-C     Allergies: Allergies  Allergen Reactions   Codeine     Makes me sick     Social History:  reports that he has been smoking cigarettes. He has been smoking an average of 1 pack per day. He has never used smokeless tobacco. He reports current drug use. Drug: Marijuana. He reports that he does not drink alcohol.   Family History: Family History  Adopted: Yes  Problem Relation Age of Onset   Prostate cancer Neg Hx     Review of Systems: Review of Systems  Constitutional:  Negative for chills and fever.  HENT:  Negative for hearing loss.   Respiratory:  Negative for shortness of breath.   Cardiovascular:  Negative for chest pain.  Gastrointestinal:  Positive for abdominal pain. Negative for constipation, diarrhea, nausea and vomiting.  Genitourinary:  Negative for dysuria.  Musculoskeletal:  Negative for myalgias.  Skin:  Negative for rash.  Neurological:  Negative for dizziness.  Psychiatric/Behavioral:  Negative for depression.     Physical Exam BP (!) 167/90   Pulse 60   Temp 98.2 F (36.8 C) (Oral)   Ht 5\' 9"  (1.753 m)   Wt 190 lb 9.6 oz (86.5 kg)   SpO2 99%   BMI 28.15 kg/m  CONSTITUTIONAL: Acute distress, well-nourished HEENT:  Normocephalic, atraumatic, extraocular motion intact. NECK: Trachea is midline, and there is no jugular venous distension.  RESPIRATORY:  Lungs are clear, and breath sounds are equal bilaterally. Normal respiratory effort without pathologic use of accessory muscles. CARDIOVASCULAR: Heart is regular without murmurs, gallops, or rubs. GI: The abdomen is soft, nondistended, with tenderness to palpation at the umbilicus.  The patient has an incarcerated umbilical hernia without any evidence of strangulation, overlying skin changes, and the hernia contents appear soft.  MUSCULOSKELETAL:  Normal muscle strength and tone in all four extremities.  No peripheral edema or cyanosis. SKIN: Skin turgor is normal. There are no pathologic skin lesions.   NEUROLOGIC:  Motor and sensation is grossly normal.  Cranial nerves are grossly intact. PSYCH:  Alert and oriented to person, place and time. Affect is normal.  Laboratory Analysis: Labs from 04/17/2022: Sodium 140, potassium 4.4, chloride 107, CO2 29.7, BUN 10, creatinine 1.1.  Total bilirubin 0.4, AST 18, ALT 23, alkaline phosphatase 72, albumin 4.1.  WBC 14, hemoglobin 15.6, hematocrit 45.3, platelets 251.  Imaging: CT angio aortobifem on 03/13/2022: IMPRESSION: VASCULAR 1. 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 2. Extensive heterogeneous and irregular/ulcerated fibrofatty atherosclerotic plaque throughout the infrarenal abdominal aorta. Small posterior penetrating atherosclerotic ulcer at the distal most aorta. Aortic Atherosclerosis (ICD10-I70.0). 3. Multiplicity of renal arteries bilaterally with multifocal  mild to moderate stenoses. 4. Diseased small caliber inferior mesenteric artery.   NON-VASCULAR 1. No acute abnormality within the abdomen or pelvis. 2. Fat containing umbilical hernia. 3. Diffuse bronchial wall thickening suggests chronic bronchitis. 4. Additional ancillary findings as above.  Assessment and Plan: This is a 57 y.o. male with an incarcerated umbilical hernia.  --Discussed with the patient the findings on exam today.  He does have an incarcerated umbilical hernia and although in the past it has been like this, more recently he's been experiencing more bulging and more pain with it.  Given the progression of symptoms, I recommended surgical repair.  He's in agreement. --Discussed with him the plan for a robotic assisted incarcerated umbilical hernia repair.  Reviewed with him the surgery at length including the planned incisions, the risks of bleeding, infection, injury to surrounding structures, that this would be an outpatient surgery, use of mesh, post-operative pain  control, activity restrictions, and he's willing to proceed. --Given his cardiac history, will send for cardiology clearance.  Ideally would like to stop both Aspirin and Plavix, but ok with only stopping Plavix if that's what's needed by cardiology.   --Will schedule him for surgery on 03/05/23.  Last dose of Plavix would be on 02/25/23.  All of his questions have been answered.  I spent 55 minutes dedicated to the care of this patient on the date of this encounter to include pre-visit review of records, face-to-face time with the patient discussing diagnosis and management, and any post-visit coordination of care.   Howie Ill, MD [facility]

## 2023-02-16 NOTE — Patient Instructions (Signed)
Our surgery scheduler Barbara will call you within 24-48 hours to get you scheduled. If you have not heard from her after 48 hours, please call our office. Have the blue sheet available when she calls to write down important information.   If you have any concerns or questions, please feel free to call our office.   Umbilical Hernia, Adult  A hernia is a bulge of tissue that pushes through an opening between muscles. An umbilical hernia happens in the abdomen, near the belly button (umbilicus). The hernia may contain tissues from the small intestine, large intestine, or fatty tissue covering the intestines. Umbilical hernias in adults tend to get worse over time, and they require surgical treatment. There are different types of umbilical hernias, including: Indirect hernia. This type is located just above or below the umbilicus. It is the most common type of umbilical hernia in adults. Direct hernia. This type forms through an opening formed by the umbilicus. Reducible hernia. This type of hernia comes and goes. It may be visible only when you strain, lift something heavy, or cough. This type of hernia can be pushed back into the abdomen (reduced). Incarcerated hernia. This type traps abdominal tissue inside the hernia. This type of hernia cannot be reduced. Strangulated hernia. This type of hernia cuts off blood flow to the tissues inside the hernia. The tissues can start to die if this happens. This type of hernia requires emergency treatment. What are the causes? An umbilical hernia happens when tissue inside the abdomen presses on a weak area of the abdominal muscles. What increases the risk? You may have a greater risk of this condition if you: Are obese. Have had several pregnancies. Have a buildup of fluid inside your abdomen. Have had surgery that weakens the abdominal muscles. What are the signs or symptoms? The main symptom of this condition is a painless bulge at or near the belly  button. A reducible hernia may be visible only when you strain, lift something heavy, or cough. Other symptoms may include: Dull pain. A feeling of pressure. Symptoms of a strangulated hernia may include: Pain that gets increasingly worse. Nausea and vomiting. Pain when pressing on the hernia. Skin over the hernia becoming red or purple. Constipation. Blood in the stool. How is this diagnosed? This condition may be diagnosed based on: A physical exam. You may be asked to cough or strain while standing. These actions increase the pressure inside your abdomen and can force the hernia through the opening in your muscles. Your health care provider may try to reduce the hernia by pressing on it. Your symptoms and medical history. How is this treated? Surgery is the only treatment for an umbilical hernia. Surgery for a strangulated hernia is done as soon as possible. If you have a small hernia that is not incarcerated, you may need to lose weight before having surgery. Follow these instructions at home: Lose weight, if told by your health care provider. Do not try to push the hernia back in. Watch your hernia for any changes in color or size. Tell your health care provider if any changes occur. You may need to avoid activities that increase pressure on your hernia. Do not lift anything that is heavier than 10 lb (4.5 kg), or the limit that you are told, until your health care provider says that it is safe. Take over-the-counter and prescription medicines only as told by your health care provider. Keep all follow-up visits. This is important. Contact a health care   provider if: Your hernia gets larger. Your hernia becomes painful. Get help right away if: You develop sudden, severe pain near the area of your hernia. You have pain as well as nausea or vomiting. You have pain and the skin over your hernia changes color. You develop a fever or chills. Summary A hernia is a bulge of tissue that  pushes through an opening between muscles. An umbilical hernia happens near the belly button. Surgery is the only treatment for an umbilical hernia. Do not try to push your hernia back in. Keep all follow-up visits. This is important. This information is not intended to replace advice given to you by your health care provider. Make sure you discuss any questions you have with your health care provider. Document Revised: 05/07/2020 Document Reviewed: 05/07/2020 Elsevier Patient Education  2023 [employer] Inc[employer]  

## 2023-02-16 NOTE — Telephone Encounter (Signed)
   Name: Charles Holder  DOB: Oct 23, 1965  MRN: [id number]  Primary Cardiologist: Julien Nordmann, MD   Preoperative team, please contact this patient and set up a phone call appointment for further preoperative risk assessment. Please obtain consent and complete medication review. Thank you for your help.  I confirm that guidance regarding antiplatelet and oral anticoagulation therapy has been completed and, if necessary, noted below.  Per office protocol, he may hold Plavix for 5-7 days prior to procedure and should resume as soon as hemodynamically stable postoperatively.    Carlos Levering, NP 02/16/2023, 5:23 PM [facility]

## 2023-02-16 NOTE — H&P (View-Only) (Signed)
02/16/2023  Reason for Visit: Umbilical hernia  Requesting Provider: Richard Letvak, MD  History of Present Illness: Charles Holder is a 57 y.o. male presenting for evaluation of an umbilical hernia.  The patient reports he's had this hernia for many years.  It had not really given him any symptoms in the past and was told in the past that if it was not bothering him, he did not need to do any surgery for it.  The hernia typically is always bulging out but is otherwise asymptomatic unless it's bumped or hit.  However, more recently, over the past two weeks, he's been experiencing more pain at the umbilicus.  He reports that the hernia has been bulging more, and when it does, it hurts him more.  Has had three episodes of this recently.  He's been able to push it down to its baseline bulging.  He does do heavy lifting and has been recently working on landscaping at home and has been lifting heavy buckets of rocks for his driveway/pathway.  Denies any nausea, vomiting, constipation, diarrhea.  Of note, he smokes about less than a pack per day.  He does have a cardiac history with an MI in the past and has required a cardiac stent in 2012.  More recently, he's also had issues with PVD and has required stents with vascular surgery as well.  He's on ASA and Plavix.  Past Medical History: Past Medical History:  Diagnosis Date   Adopted    Anxiety    Atrial fibrillation (HCC)    BPH (benign prostatic hyperplasia)    Cardiomyopathy, ischemic 01/22/2011   EF 50%   Chronic lower back pain    s/p L5-S1   Coronary artery disease    Depression    ED (erectile dysfunction)    Fatigue    Headache    Heart attack (HCC)    Herpes genitalia    Hyperlipidemia    Hypertension    Hypogonadism in male    Neuropathy    Obesity    Prostate cancer (HCC)    SOB (shortness of breath)      Past Surgical History: Past Surgical History:  Procedure Laterality Date   ABDOMINAL AORTOGRAM W/LOWER EXTREMITY N/A  03/25/2022   Procedure: ABDOMINAL AORTOGRAM W/LOWER EXTREMITY;  Surgeon: Brabham, Vance W, MD;  Location: [facility];  Service: Cardiovascular;  Laterality: N/A;   back surgeries     x 4   CORONARY ANGIOPLASTY  01/22/2011   stent   PERIPHERAL VASCULAR INTERVENTION  03/25/2022   Procedure: PERIPHERAL VASCULAR INTERVENTION;  Surgeon: Brabham, Vance W, MD;  Location: [facility];  Service: Cardiovascular;;  LT Iliac    Home Medications: Prior to Admission medications   Medication Sig Start Date End Date Taking? Authorizing Provider  amLODipine (NORVASC) 5 MG tablet Take 1 tablet (5 mg total) by mouth daily. 06/11/22  Yes Gollan, Timothy J, MD  Ascorbic Acid (VITAMIN C PO) Take 1 tablet by mouth daily.   Yes [provider]  aspirin 81 MG EC tablet Take 81 mg by mouth daily.   Yes [provider]  cetirizine (ZYRTEC) 10 MG tablet Take 10 mg by mouth daily.   Yes [provider]  cholecalciferol (VITAMIN D3) 25 MCG (1000 UNIT) tablet Take 1,000 Units by mouth daily.   Yes [provider]  clopidogrel (PLAVIX) 75 MG tablet Take 1 tablet (75 mg total) by mouth daily. 06/11/22  Yes Gollan, Timothy J, MD    ezetimibe (ZETIA) 10 MG tablet Take 1 tablet (10 mg total) by mouth daily. 06/11/22  Yes Gollan, Timothy J, MD  Multiple Vitamins-Minerals (MULTIVITAMIN ADULT PO) Take 1 tablet by mouth daily.   Yes [provider]  nitroGLYCERIN (NITROSTAT) 0.4 MG SL tablet Place 1 tablet (0.4 mg total) under the tongue every 5 (five) minutes as needed for chest pain. 04/16/22  Yes Gollan, Timothy J, MD  Probiotic Product (PROBIOTIC ADVANCED PO) Take 1 capsule by mouth daily.   Yes [provider]  rosuvastatin (CRESTOR) 5 MG tablet Take 1 tablet (5 mg total) by mouth daily. 06/11/22  Yes Gollan, Timothy J, MD  valACYclovir (VALTREX) 1000 MG tablet Take 1 tablet (1,000 mg total) by mouth daily. 06/17/22  Yes McGowan, Shannon A, PA-C     Allergies: Allergies  Allergen Reactions   Codeine     Makes me sick     Social History:  reports that he has been smoking cigarettes. He has been smoking an average of 1 pack per day. He has never used smokeless tobacco. He reports current drug use. Drug: Marijuana. He reports that he does not drink alcohol.   Family History: Family History  Adopted: Yes  Problem Relation Age of Onset   Prostate cancer Neg Hx     Review of Systems: Review of Systems  Constitutional:  Negative for chills and fever.  HENT:  Negative for hearing loss.   Respiratory:  Negative for shortness of breath.   Cardiovascular:  Negative for chest pain.  Gastrointestinal:  Positive for abdominal pain. Negative for constipation, diarrhea, nausea and vomiting.  Genitourinary:  Negative for dysuria.  Musculoskeletal:  Negative for myalgias.  Skin:  Negative for rash.  Neurological:  Negative for dizziness.  Psychiatric/Behavioral:  Negative for depression.     Physical Exam BP (!) 167/90   Pulse 60   Temp 98.2 F (36.8 C) (Oral)   Ht 5' 9" (1.753 m)   Wt 190 lb 9.6 oz (86.5 kg)   SpO2 99%   BMI 28.15 kg/m  CONSTITUTIONAL: Acute distress, well-nourished HEENT:  Normocephalic, atraumatic, extraocular motion intact. NECK: Trachea is midline, and there is no jugular venous distension.  RESPIRATORY:  Lungs are clear, and breath sounds are equal bilaterally. Normal respiratory effort without pathologic use of accessory muscles. CARDIOVASCULAR: Heart is regular without murmurs, gallops, or rubs. GI: The abdomen is soft, nondistended, with tenderness to palpation at the umbilicus.  The patient has an incarcerated umbilical hernia without any evidence of strangulation, overlying skin changes, and the hernia contents appear soft.  MUSCULOSKELETAL:  Normal muscle strength and tone in all four extremities.  No peripheral edema or cyanosis. SKIN: Skin turgor is normal. There are no pathologic skin lesions.   NEUROLOGIC:  Motor and sensation is grossly normal.  Cranial nerves are grossly intact. PSYCH:  Alert and oriented to person, place and time. Affect is normal.  Laboratory Analysis: Labs from 04/17/2022: Sodium 140, potassium 4.4, chloride 107, CO2 29.7, BUN 10, creatinine 1.1.  Total bilirubin 0.4, AST 18, ALT 23, alkaline phosphatase 72, albumin 4.1.  WBC 14, hemoglobin 15.6, hematocrit 45.3, platelets 251.  Imaging: CT angio aortobifem on 03/13/2022: IMPRESSION: VASCULAR 1. 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 2. Extensive heterogeneous and irregular/ulcerated fibrofatty atherosclerotic plaque throughout the infrarenal abdominal aorta. Small posterior penetrating atherosclerotic ulcer at the distal most aorta. Aortic Atherosclerosis (ICD10-I70.0). 3. Multiplicity of renal arteries bilaterally with multifocal   mild to moderate stenoses. 4. Diseased small caliber inferior mesenteric artery.   NON-VASCULAR 1. No acute abnormality within the abdomen or pelvis. 2. Fat containing umbilical hernia. 3. Diffuse bronchial wall thickening suggests chronic bronchitis. 4. Additional ancillary findings as above.  Assessment and Plan: This is a 56 y.o. male with an incarcerated umbilical hernia.  --Discussed with the patient the findings on exam today.  He does have an incarcerated umbilical hernia and although in the past it has been like this, more recently he's been experiencing more bulging and more pain with it.  Given the progression of symptoms, I recommended surgical repair.  He's in agreement. --Discussed with him the plan for a robotic assisted incarcerated umbilical hernia repair.  Reviewed with him the surgery at length including the planned incisions, the risks of bleeding, infection, injury to surrounding structures, that this would be an outpatient surgery, use of mesh, post-operative pain  control, activity restrictions, and he's willing to proceed. --Given his cardiac history, will send for cardiology clearance.  Ideally would like to stop both Aspirin and Plavix, but ok with only stopping Plavix if that's what's needed by cardiology.   --Will schedule him for surgery on 03/05/23.  Last dose of Plavix would be on 02/25/23.  All of his questions have been answered.  I spent 55 minutes dedicated to the care of this patient on the date of this encounter to include pre-visit review of records, face-to-face time with the patient discussing diagnosis and management, and any post-visit coordination of care.   Arnet Hofferber Luis Cadynce Garrette, MD [facility]    

## 2023-02-16 NOTE — Telephone Encounter (Signed)
   Pre-operative Risk Assessment    Patient Name: Charles Holder  DOB: 1965-12-21 MRN: [id number]      Request for Surgical Clearance    Procedure:  Robotic Umbilical Hernia Repair  Date of Surgery:  Clearance 03/05/23                                 Surgeon:  Dr. Henrene Dodge Surgeon's Group or Practice Name:  [facility] Phone number:  [phone] Fax number:  [phone]   Type of Clearance Requested:  Medical, when to stop Plavix    Type of Anesthesia:  Not Indicated   Additional requests/questions:    Signed, Narda Amber   02/16/2023, 3:53 PM

## 2023-02-16 NOTE — Telephone Encounter (Signed)
Faxed cardiac clearance to Dr. Julien Nordmann to [phone].

## 2023-02-17 ENCOUNTER — Telehealth: Payer: Self-pay | Admitting: Surgery

## 2023-02-17 ENCOUNTER — Telehealth: Payer: Self-pay | Admitting: *Deleted

## 2023-02-17 NOTE — Telephone Encounter (Signed)
Patient has been advised of Pre-Admission date/time, and Surgery date at [facility].  Surgery Date: 03/05/23 Preadmission Testing Date: 02/25/23 (phone 8a-1p)  Patient has been made aware to call [phone], between 1-3:00pm the day before surgery, to find out what time to arrive for surgery.    

## 2023-02-17 NOTE — Telephone Encounter (Signed)
I s/w the pt and he has been scheduled for tele pre op appt 02/18/23 @ 9 am. Pt asked to call his home # [phone] as he will get better reception. Med rec and consent are done.      Patient Consent for Virtual Visit        Charles Holder has provided verbal consent on 02/17/2023 for a virtual visit (video or telephone).   CONSENT FOR VIRTUAL VISIT FOR:  Charles Holder  By participating in this virtual visit I agree to the following:  I hereby voluntarily request, consent and authorize [facility] and its employed or contracted physicians, physician assistants, nurse practitioners or other licensed health care professionals (the Practitioner), to provide me with telemedicine health care services (the "Services") as deemed necessary by the treating Practitioner. I acknowledge and consent to receive the Services by the Practitioner via telemedicine. I understand that the telemedicine visit will involve communicating with the Practitioner through live audiovisual communication technology and the disclosure of certain medical information by electronic transmission. I acknowledge that I have been given the opportunity to request an in-person assessment or other available alternative prior to the telemedicine visit and am voluntarily participating in the telemedicine visit.  I understand that I have the right to withhold or withdraw my consent to the use of telemedicine in the course of my care at any time, without affecting my right to future care or treatment, and that the Practitioner or I may terminate the telemedicine visit at any time. I understand that I have the right to inspect all information obtained and/or recorded in the course of the telemedicine visit and may receive copies of available information for a reasonable fee.  I understand that some of the potential risks of receiving the Services via telemedicine include:  Delay or interruption in medical evaluation due to  technological equipment failure or disruption; Information transmitted may not be sufficient (e.g. poor resolution of images) to allow for appropriate medical decision making by the Practitioner; and/or  In rare instances, security protocols could fail, causing a breach of personal health information.  Furthermore, I acknowledge that it is my responsibility to provide information about my medical history, conditions and care that is complete and accurate to the best of my ability. I acknowledge that Practitioner's advice, recommendations, and/or decision may be based on factors not within their control, such as incomplete or inaccurate data provided by me or distortions of diagnostic images or specimens that may result from electronic transmissions. I understand that the practice of medicine is not an exact science and that Practitioner makes no warranties or guarantees regarding treatment outcomes. I acknowledge that a copy of this consent can be made available to me via my patient portal [facility] MyChart), or I can request a printed copy by calling the office of [facility].    I understand that my insurance will be billed for this visit.   I have read or had this consent read to me. I understand the contents of this consent, which adequately explains the benefits and risks of the Services being provided via telemedicine.  I have been provided ample opportunity to ask questions regarding this consent and the Services and have had my questions answered to my satisfaction. I give my informed consent for the services to be provided through the use of telemedicine in my medical care

## 2023-02-17 NOTE — Telephone Encounter (Signed)
I s/w the pt and he has been scheduled for tele pre op appt 02/18/23 @ 9 am. Pt asked to call his home # [phone] as he will get better reception. Med rec and consent are done.

## 2023-02-18 ENCOUNTER — Ambulatory Visit: Payer: Medicare Other | Attending: Cardiology | Admitting: Student

## 2023-02-18 ENCOUNTER — Telehealth: Payer: Self-pay

## 2023-02-18 DIAGNOSIS — Z01810 Encounter for preprocedural cardiovascular examination: Secondary | ICD-10-CM | POA: Diagnosis not present

## 2023-02-18 NOTE — Progress Notes (Signed)
Virtual Visit via Telephone Note   Because of Keanon Vetrano Mclarty's co-morbid illnesses, he is at least at moderate risk for complications without adequate follow up.  This format is felt to be most appropriate for this patient at this time.  The patient did not have access to video technology/had technical difficulties with video requiring transitioning to audio format only (telephone).  All issues noted in this document were discussed and addressed.  No physical exam could be performed with this format.  Please refer to the patient's chart for his consent to telehealth for [facility].  Evaluation Performed:  Preoperative cardiovascular risk assessment _____________   Date:  02/18/2023   Patient ID:  Charles Holder, DOB [birth year], MRN [id number] Patient Location:  Home Provider location:   Office  Primary Care Provider:  Judy Pimple, MD Primary Cardiologist:  Julien Nordmann, MD  Chief Complaint / Patient Profile   57 y.o. y/o male with a h/o CAD s/p DES to mid Cx April 2012, PAD s/p left common iliac artery June 2023, hypertension, hyperlipidemia, prostate cancer, tobacco abuse who is pending robotic umbilical hernia repair by Dr. Aleen Campi on 03/05/2023 and presents today for telephonic preoperative cardiovascular risk assessment.  History of Present Illness    SEVREN KETT is a 57 y.o. male who presents via audio/video conferencing for a telehealth visit today.  Pt was last seen in cardiology clinic on 06/11/2022 by Dr. Mariah Milling.  At that time JAMOR AJAYI was doing well.  The patient is now pending procedure as outlined above. Since his last visit, he is doing well from a cardiac standpoint. Patient denies shortness of breath or dyspnea on exertion. No chest pain, pressure, or tightness. Denies lower extremity edema, orthopnea, or PND. No palpitations. He is very active performing moderate to heavy yard work.   Past Medical History    Past Medical History:  Diagnosis  Date   Adopted    Anxiety    Atrial fibrillation (HCC)    BPH (benign prostatic hyperplasia)    Cardiomyopathy, ischemic 01/22/2011   EF 50%   Chronic lower back pain    s/p L5-S1   Coronary artery disease    Depression    ED (erectile dysfunction)    Fatigue    Headache    Heart attack (HCC)    Herpes genitalia    Hyperlipidemia    Hypertension    Hypogonadism in male    Neuropathy    Obesity    Prostate cancer (HCC)    SOB (shortness of breath)    Past Surgical History:  Procedure Laterality Date   ABDOMINAL AORTOGRAM W/LOWER EXTREMITY N/A 03/25/2022   Procedure: ABDOMINAL AORTOGRAM W/LOWER EXTREMITY;  Surgeon: Nada Libman, MD;  Location: [facility] INVASIVE CV LAB;  Service: Cardiovascular;  Laterality: N/A;   back surgeries     x 4   CORONARY ANGIOPLASTY  01/22/2011   stent   PERIPHERAL VASCULAR INTERVENTION  03/25/2022   Procedure: PERIPHERAL VASCULAR INTERVENTION;  Surgeon: Nada Libman, MD;  Location: [facility] INVASIVE CV LAB;  Service: Cardiovascular;;  LT Iliac    Allergies  Allergies  Allergen Reactions   Codeine     Makes me sick     Home Medications    Prior to Admission medications   Medication Sig Start Date End Date Taking? Authorizing Provider  amLODipine (NORVASC) 5 MG tablet Take 1 tablet (5 mg total) by mouth daily. 06/11/22   Antonieta Iba, MD  Ascorbic Acid (  VITAMIN C PO) Take 1 tablet by mouth daily.    [provider]  aspirin 81 MG EC tablet Take 81 mg by mouth daily.    [provider]  cetirizine (ZYRTEC) 10 MG tablet Take 10 mg by mouth daily.    [provider]  cholecalciferol (VITAMIN D3) 25 MCG (1000 UNIT) tablet Take 1,000 Units by mouth daily.    [provider]  clopidogrel (PLAVIX) 75 MG tablet Take 1 tablet (75 mg total) by mouth daily. 06/11/22   Antonieta Iba, MD  ezetimibe (ZETIA) 10 MG tablet Take 1 tablet (10 mg total) by mouth daily. 06/11/22   Antonieta Iba, MD  Multiple  Vitamins-Minerals (MULTIVITAMIN ADULT PO) Take 1 tablet by mouth daily.    [provider]  nitroGLYCERIN (NITROSTAT) 0.4 MG SL tablet Place 1 tablet (0.4 mg total) under the tongue every 5 (five) minutes as needed for chest pain. 04/16/22   Antonieta Iba, MD  Probiotic Product (PROBIOTIC ADVANCED PO) Take 1 capsule by mouth daily.    [provider]  rosuvastatin (CRESTOR) 5 MG tablet Take 1 tablet (5 mg total) by mouth daily. 06/11/22   Antonieta Iba, MD  valACYclovir (VALTREX) 1000 MG tablet Take 1 tablet (1,000 mg total) by mouth daily. 06/17/22   Harle Battiest, PA-C    Physical Exam    Vital Signs:  KUTLER GUPTA does not have vital signs available for review today.  Given telephonic nature of communication, physical exam is limited. AAOx3. NAD. Normal affect.  Speech and respirations are unlabored.  Accessory Clinical Findings    None  Assessment & Plan    Primary Cardiologist: Julien Nordmann, MD  Preoperative cardiovascular risk assessment.  Robotic umbilical hernia repair by Dr. Aleen Campi on 03/05/2023.  Chart reviewed as part of pre-operative protocol coverage. According to the RCRI, patient has a 0.9% risk of MACE. 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   Given past medical history and time since last visit, based on ACC/AHA guidelines, IZAIR ELWART would be at acceptable risk for the planned procedure without further cardiovascular testing.   Patient was advised that if he develops new symptoms prior to surgery to contact our office to arrange a follow-up appointment.  he verbalized understanding.  Per office protocol, he may hold Plavix for 5-7 days prior to procedure and should resume as soon as hemodynamically stable postoperatively.  Ideally aspirin should be continued without interruption, however if the bleeding risk is too great, aspirin may be held for 5-7 days prior to surgery.  Please resume aspirin post operatively when it is felt to be safe from a bleeding standpoint.    I will route this recommendation to the requesting party via Epic fax function.  Please call with questions.  Time:   Today, I have spent 5 minutes with the patient with telehealth technology discussing medical history, symptoms, and management plan.     Carlos Levering, NP  02/18/2023, 8:34 AM

## 2023-02-18 NOTE — Telephone Encounter (Signed)
Message left for the patient. Received clearance from Cardiology and he may hold his Plavix and Aspirin for 7 days prior to surgery. His last dose of both will be on 02/25/23. He may call us back with any further questions.

## 2023-02-18 NOTE — Progress Notes (Unsigned)
Cardiology clearance received from [facility] The patient is at acceptable risk for surgery. He may hold his Plavix and Aspirin for 5-7 days before surgery.

## 2023-02-25 ENCOUNTER — Encounter
Admission: RE | Admit: 2023-02-25 | Discharge: 2023-02-25 | Disposition: A | Discharge status: Home / Self Care | Payer: Medicare Other | Source: Ambulatory Visit | Attending: Surgery | Admitting: Surgery

## 2023-02-25 DIAGNOSIS — I10 Essential (primary) hypertension: Secondary | ICD-10-CM

## 2023-02-25 DIAGNOSIS — Z01812 Encounter for preprocedural laboratory examination: Secondary | ICD-10-CM

## 2023-02-25 DIAGNOSIS — I2510 Atherosclerotic heart disease of native coronary artery without angina pectoris: Secondary | ICD-10-CM

## 2023-02-25 DIAGNOSIS — I219 Acute myocardial infarction, unspecified: Secondary | ICD-10-CM

## 2023-02-25 DIAGNOSIS — Z01810 Encounter for preprocedural cardiovascular examination: Secondary | ICD-10-CM

## 2023-02-25 HISTORY — DX: Long term (current) use of anticoagulants: Z79.01

## 2023-02-25 HISTORY — DX: Embolism and thrombosis of iliac artery: I74.5

## 2023-02-25 HISTORY — DX: Umbilical hernia with obstruction, without gangrene: K42.0

## 2023-02-25 HISTORY — DX: Gastro-esophageal reflux disease without esophagitis: K21.9

## 2023-02-25 HISTORY — DX: Presence of coronary angioplasty implant and graft: Z95.5

## 2023-02-25 HISTORY — DX: Reserved for inherently not codable concepts without codable children: IMO0001

## 2023-02-25 HISTORY — DX: Peripheral vascular disease, unspecified: I73.9

## 2023-02-25 HISTORY — DX: Elevated white blood cell count, unspecified: D72.829

## 2023-02-25 HISTORY — DX: Nicotine dependence, unspecified, uncomplicated: F17.200

## 2023-02-25 NOTE — Patient Instructions (Addendum)
Your procedure is scheduled on:03-05-23 Thursday Report to the Registration Desk on the 1st floor of the Medical Mall.Then proceed to the 2nd floor Surgery Desk To find out your arrival time, please call [phone] between 1PM - 3PM on:03-04-23 Wednesday If your arrival time is 6:00 am, do not arrive before that time as the Medical Mall entrance doors do not open until 6:00 am.  REMEMBER: Instructions that are not followed completely may result in serious medical risk, up to and including death; or upon the discretion of your surgeon and anesthesiologist your surgery may need to be rescheduled.  Do not eat food after midnight the night before surgery.  No gum chewing or hard candies.  You may however, drink CLEAR liquids up to 2 hours before you are scheduled to arrive for your surgery. Do not drink anything within 2 hours of your scheduled arrival time.  Clear liquids include: - water  - apple juice without pulp - gatorade (not RED colors) - black coffee or tea (Do NOT add milk or creamers to the coffee or tea) Do NOT drink anything that is not on this list.  One week prior to surgery: Stop Anti-inflammatories (NSAIDS) such as Advil, Aleve, Ibuprofen, Motrin, Naproxen, Naprosyn and Aspirin based products such as Excedrin, Goody's Powder, BC Powder.You may however, continue to take Tylenol if needed for pain up until the day of surgery. Stop ANY OVER THE COUNTER supplements/vitamins NOW (02-25-23) until after surgery (Vitamin C, D3, Multivitamin and Probiotic)  Continue taking all prescribed medications with the exception of the following: -Stop your Aspirin and clopidogrel (PLAVIX) 7 days prior to surgery-Last dose will be on 02-25-23 Wednesday  TAKE ONLY THESE MEDICATIONS THE MORNING OF SURGERY WITH A SIP OF WATER: -amLODipine (NORVASC)  -cetirizine (ZYRTEC)  -ezetimibe (ZETIA)  -rosuvastatin (CRESTOR)   No Alcohol for 24 hours before or after surgery.  No Smoking including  e-cigarettes for 24 hours before surgery.  No chewable tobacco products for at least 6 hours before surgery.  No nicotine patches on the day of surgery.  Do not use any "recreational" drugs for at least a week (preferably 2 weeks) before your surgery.  Please be advised that the combination of cocaine and anesthesia may have negative outcomes, up to and including death. If you test positive for cocaine, your surgery will be cancelled.  On the morning of surgery brush your teeth with toothpaste and water, you may rinse your mouth with mouthwash if you wish. Do not swallow any toothpaste or mouthwash.  Use CHG Soap as directed on instruction sheet.  Do not wear jewelry, make-up, hairpins, clips or nail polish.  Do not wear lotions, powders, or perfumes.   Do not shave body hair from the neck down 48 hours before surgery.  Contact lenses, hearing aids and dentures may not be worn into surgery.  Do not bring valuables to the hospital. [facility] is not responsible for any missing/lost belongings or valuables.   Notify your doctor if there is any change in your medical condition (cold, fever, infection).  Wear comfortable clothing (specific to your surgery type) to the hospital.  After surgery, you can help prevent lung complications by doing breathing exercises.  Take deep breaths and cough every 1-2 hours. Your doctor may order a device called an Incentive Spirometer to help you take deep breaths. When coughing or sneezing, hold a pillow firmly against your incision with both hands. This is called "splinting." Doing this helps protect your incision. It also  decreases belly discomfort.  If you are being admitted to the hospital overnight, leave your suitcase in the car. After surgery it may be brought to your room.  In case of increased patient census, it may be necessary for you, the patient, to continue your postoperative care in the Same Day Surgery department.  If you are being  discharged the day of surgery, you will not be allowed to drive home. You will need a responsible individual to drive you home and stay with you for 24 hours after surgery.   If you are taking public transportation, you will need to have a responsible individual with you.  Please call the Pre-admissions Testing Dept. at [phone] if you have any questions about these instructions.  Surgery Visitation Policy:  Patients having surgery or a procedure may have two visitors.  Children under the age of 42 must have an adult with them who is not the patient.     Preparing for Surgery with CHLORHEXIDINE GLUCONATE (CHG) Soap  Chlorhexidine Gluconate (CHG) Soap  o An antiseptic cleaner that kills germs and bonds with the skin to continue killing germs even after washing  o Used for showering the night before surgery and morning of surgery  Before surgery, you can play an important role by reducing the number of germs on your skin.  CHG (Chlorhexidine gluconate) soap is an antiseptic cleanser which kills germs and bonds with the skin to continue killing germs even after washing.  Please do not use if you have an allergy to CHG or antibacterial soaps. If your skin becomes reddened/irritated stop using the CHG.  1. Shower the NIGHT BEFORE SURGERY and the MORNING OF SURGERY with CHG soap.  2. If you choose to wash your hair, wash your hair first as usual with your normal shampoo.  3. After shampooing, rinse your hair and body thoroughly to remove the shampoo.  4. Use CHG as you would any other liquid soap. You can apply CHG directly to the skin and wash gently with a scrungie or a clean washcloth.  5. Apply the CHG soap to your body only from the neck down. Do not use on open wounds or open sores. Avoid contact with your eyes, ears, mouth, and genitals (private parts). Wash face and genitals (private parts) with your normal soap.  6. Wash thoroughly, paying special attention to the area  where your surgery will be performed.  7. Thoroughly rinse your body with warm water.  8. Do not shower/wash with your normal soap after using and rinsing off the CHG soap.  9. Pat yourself dry with a clean towel.  10. Wear clean pajamas to bed the night before surgery.  12. Place clean sheets on your bed the night of your first shower and do not sleep with pets.  13. Shower again with the CHG soap on the day of surgery prior to arriving at the hospital.  14. Do not apply any deodorants/lotions/powders.  15. Please wear clean clothes to the hospital.

## 2023-02-25 NOTE — Pre-Procedure Instructions (Addendum)
Called pt to do his anesthesia interview. Pt informed that he will need to come in for labs and EKG preop as soon as possible. Pt [place] the beach and [place] not coming back home until Tuesday (5-21) so he states he can come in on Wednesday, the day prior to his surgery. Pt informed that if anything is abnormal, whether it be labs or EKG there is a possibility his surgery will be cancelled. Pt verbalized understanding and states "I think I'll be ok" Pt coming in for labs and EKG day prior to surgery

## 2023-02-27 ENCOUNTER — Encounter: Payer: Self-pay | Admitting: Surgery
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
## 2023-03-04 ENCOUNTER — Encounter
Admission: RE | Admit: 2023-03-04 | Discharge: 2023-03-04 | Disposition: A | Discharge status: Home / Self Care | Payer: Medicare Other | Source: Ambulatory Visit | Attending: Surgery | Admitting: Surgery

## 2023-03-04 DIAGNOSIS — R001 Bradycardia, unspecified: Secondary | ICD-10-CM | POA: Diagnosis not present

## 2023-03-04 DIAGNOSIS — Z01818 Encounter for other preprocedural examination: Secondary | ICD-10-CM | POA: Insufficient documentation

## 2023-03-04 DIAGNOSIS — I2510 Atherosclerotic heart disease of native coronary artery without angina pectoris: Secondary | ICD-10-CM | POA: Insufficient documentation

## 2023-03-04 DIAGNOSIS — Z01812 Encounter for preprocedural laboratory examination: Secondary | ICD-10-CM

## 2023-03-04 DIAGNOSIS — I219 Acute myocardial infarction, unspecified: Secondary | ICD-10-CM | POA: Diagnosis not present

## 2023-03-04 DIAGNOSIS — I493 Ventricular premature depolarization: Secondary | ICD-10-CM | POA: Diagnosis not present

## 2023-03-04 DIAGNOSIS — I10 Essential (primary) hypertension: Secondary | ICD-10-CM | POA: Diagnosis not present

## 2023-03-04 DIAGNOSIS — Z01810 Encounter for preprocedural cardiovascular examination: Secondary | ICD-10-CM

## 2023-03-04 LAB — CBC
HCT: 48.8 % (ref 39.0–52.0)
Hemoglobin: 16.5 g/dL (ref 13.0–17.0)
MCH: 29.8 pg (ref 26.0–34.0)
MCHC: 33.8 g/dL (ref 30.0–36.0)
MCV: 88.2 fL (ref 80.0–100.0)
Platelets: 245 10*3/uL (ref 150–400)
RBC: 5.53 MIL/uL (ref 4.22–5.81)
RDW: 13 % (ref 11.5–15.5)
WBC: 14.2 10*3/uL — ABNORMAL HIGH (ref 4.0–10.5)
nRBC: 0 % (ref 0.0–0.2)

## 2023-03-04 LAB — BASIC METABOLIC PANEL
Anion gap: 9 (ref 5–15)
BUN: 15 mg/dL (ref 6–20)
CO2: 24 mmol/L (ref 22–32)
Calcium: 9.2 mg/dL (ref 8.9–10.3)
Chloride: 105 mmol/L (ref 98–111)
Creatinine, Ser: 1.08 mg/dL (ref 0.61–1.24)
GFR, Estimated: >60 mL/min (ref >60)
Glucose, Bld: 96 mg/dL (ref 70–99)
Potassium: 4.2 mmol/L (ref 3.5–5.1)
Sodium: 138 mmol/L (ref 135–145)

## 2023-03-04 MED ORDER — CHLORHEXIDINE GLUCONATE CLOTH 2 % EX PADS: 6.0000 | MEDICATED_PAD | Freq: Once | CUTANEOUS | Status: DC

## 2023-03-04 MED ORDER — BUPIVACAINE LIPOSOME 1.3 % IJ SUSP: 20.0000 mL | Freq: Once | INTRAMUSCULAR | Status: DC

## 2023-03-04 MED ORDER — GABAPENTIN 300 MG PO CAPS
300.0000 mg | ORAL_CAPSULE | ORAL | Status: AC
  Administered 2023-03-05: 300 mg via ORAL

## 2023-03-04 MED ORDER — CEFAZOLIN SODIUM-DEXTROSE 2-4 GM/100ML-% IV SOLN
2.0000 g | INTRAVENOUS | Status: AC
  Administered 2023-03-05: 2 g via INTRAVENOUS

## 2023-03-04 MED ORDER — ACETAMINOPHEN 500 MG PO TABS
1000.0000 mg | ORAL_TABLET | ORAL | Status: AC
  Administered 2023-03-05: 1000 mg via ORAL

## 2023-03-04 MED ORDER — CHLORHEXIDINE GLUCONATE 0.12 % MT SOLN
15.0000 mL | Freq: Once | OROMUCOSAL | Status: AC
  Administered 2023-03-05: 15 mL via OROMUCOSAL

## 2023-03-04 MED ORDER — FAMOTIDINE 20 MG PO TABS
20.0000 mg | ORAL_TABLET | Freq: Once | ORAL | Status: AC
  Administered 2023-03-05: 20 mg via ORAL

## 2023-03-04 MED ORDER — ORAL CARE MOUTH RINSE: 15.0000 mL | Freq: Once | OROMUCOSAL | Status: AC

## 2023-03-04 MED ORDER — LACTATED RINGERS IV SOLN: INTRAVENOUS | Status: DC

## 2023-03-05 ENCOUNTER — Ambulatory Visit: Payer: Medicare Other | Admitting: Urgent Care

## 2023-03-05 ENCOUNTER — Encounter: Payer: Self-pay | Admitting: Surgery

## 2023-03-05 ENCOUNTER — Encounter
Admission: RE | Disposition: A | Discharge status: Home / Self Care | Payer: Self-pay | Source: Ambulatory Visit | Attending: Surgery

## 2023-03-05 ENCOUNTER — Ambulatory Visit
Admission: RE | Admit: 2023-03-05 | Discharge: 2023-03-05 | Disposition: A | Discharge status: Home / Self Care | Payer: Medicare Other | Source: Ambulatory Visit | Attending: Surgery | Admitting: Surgery

## 2023-03-05 ENCOUNTER — Other Ambulatory Visit: Payer: Self-pay

## 2023-03-05 DIAGNOSIS — Z955 Presence of coronary angioplasty implant and graft: Secondary | ICD-10-CM | POA: Insufficient documentation

## 2023-03-05 DIAGNOSIS — I2510 Atherosclerotic heart disease of native coronary artery without angina pectoris: Secondary | ICD-10-CM | POA: Insufficient documentation

## 2023-03-05 DIAGNOSIS — Z95820 Peripheral vascular angioplasty status with implants and grafts: Secondary | ICD-10-CM | POA: Diagnosis not present

## 2023-03-05 DIAGNOSIS — Z7902 Long term (current) use of antithrombotics/antiplatelets: Secondary | ICD-10-CM | POA: Insufficient documentation

## 2023-03-05 DIAGNOSIS — K420 Umbilical hernia with obstruction, without gangrene: Secondary | ICD-10-CM | POA: Diagnosis not present

## 2023-03-05 DIAGNOSIS — F17210 Nicotine dependence, cigarettes, uncomplicated: Secondary | ICD-10-CM | POA: Diagnosis not present

## 2023-03-05 DIAGNOSIS — Z7982 Long term (current) use of aspirin: Secondary | ICD-10-CM | POA: Insufficient documentation

## 2023-03-05 DIAGNOSIS — I739 Peripheral vascular disease, unspecified: Secondary | ICD-10-CM | POA: Diagnosis not present

## 2023-03-05 DIAGNOSIS — I252 Old myocardial infarction: Secondary | ICD-10-CM | POA: Diagnosis not present

## 2023-03-05 DIAGNOSIS — I10 Essential (primary) hypertension: Secondary | ICD-10-CM | POA: Diagnosis not present

## 2023-03-05 HISTORY — DX: Stricture of artery: I77.1

## 2023-03-05 HISTORY — DX: Cannabis use, unspecified, uncomplicated: F12.90

## 2023-03-05 HISTORY — DX: Long term (current) use of antithrombotics/antiplatelets: Z79.02

## 2023-03-05 HISTORY — PX: INSERTION OF MESH: SHX5868

## 2023-03-05 HISTORY — DX: Atherosclerosis of renal artery: I70.1

## 2023-03-05 HISTORY — DX: Migraine, unspecified, not intractable, without status migrainosus: G43.909

## 2023-03-05 SURGERY — REPAIR, HERNIA, UMBILICAL, ROBOT-ASSISTED
Anesthesia: General | Site: Abdomen

## 2023-03-05 MED ORDER — ROCURONIUM BROMIDE 100 MG/10ML IV SOLN
INTRAVENOUS | Status: DC | PRN
  Administered 2023-03-05: 50 mg via INTRAVENOUS
  Administered 2023-03-05: 10 mg via INTRAVENOUS
  Administered 2023-03-05: 20 mg via INTRAVENOUS

## 2023-03-05 MED ORDER — PHENYLEPHRINE HCL (PRESSORS) 10 MG/ML IV SOLN
INTRAVENOUS | Status: DC | PRN
  Administered 2023-03-05 (×2): 80 ug via INTRAVENOUS

## 2023-03-05 MED ORDER — MIDAZOLAM HCL 2 MG/2ML IJ SOLN
INTRAMUSCULAR | Status: DC | PRN
  Administered 2023-03-05: 2 mg via INTRAVENOUS

## 2023-03-05 MED ORDER — FAMOTIDINE 20 MG PO TABS
ORAL_TABLET | ORAL | Status: AC
  Filled 2023-03-05: qty 1

## 2023-03-05 MED ORDER — FENTANYL CITRATE (PF) 100 MCG/2ML IJ SOLN
INTRAMUSCULAR | Status: DC | PRN
  Administered 2023-03-05 (×2): 50 ug via INTRAVENOUS

## 2023-03-05 MED ORDER — FENTANYL CITRATE (PF) 100 MCG/2ML IJ SOLN: 25.0000 ug | INTRAMUSCULAR | Status: DC | PRN

## 2023-03-05 MED ORDER — BUPIVACAINE LIPOSOME 1.3 % IJ SUSP
INTRAMUSCULAR | Status: AC
  Filled 2023-03-05: qty 20

## 2023-03-05 MED ORDER — ACETAMINOPHEN 500 MG PO TABS: 1000.0000 mg | ORAL_TABLET | Freq: Four times a day (QID) | ORAL | Status: AC | PRN

## 2023-03-05 MED ORDER — GABAPENTIN 300 MG PO CAPS
ORAL_CAPSULE | ORAL | Status: AC
  Filled 2023-03-05: qty 1

## 2023-03-05 MED ORDER — HYDROMORPHONE HCL 1 MG/ML IJ SOLN
INTRAMUSCULAR | Status: DC | PRN
  Administered 2023-03-05 (×2): .5 mg via INTRAVENOUS

## 2023-03-05 MED ORDER — EPINEPHRINE PF 1 MG/ML IJ SOLN
INTRAMUSCULAR | Status: AC
  Filled 2023-03-05: qty 1

## 2023-03-05 MED ORDER — SODIUM CHLORIDE (PF) 0.9 % IJ SOLN
INTRAMUSCULAR | Status: DC | PRN
  Administered 2023-03-05: 60 mL

## 2023-03-05 MED ORDER — LACTATED RINGERS IV SOLN: INTRAVENOUS | Status: DC | PRN

## 2023-03-05 MED ORDER — DEXAMETHASONE SODIUM PHOSPHATE 10 MG/ML IJ SOLN
INTRAMUSCULAR | Status: DC | PRN
  Administered 2023-03-05: 5 mg via INTRAVENOUS

## 2023-03-05 MED ORDER — CHLORHEXIDINE GLUCONATE 0.12 % MT SOLN
OROMUCOSAL | Status: AC
  Filled 2023-03-05: qty 15

## 2023-03-05 MED ORDER — ACETAMINOPHEN 500 MG PO TABS
ORAL_TABLET | ORAL | Status: AC
  Filled 2023-03-05: qty 2

## 2023-03-05 MED ORDER — ONDANSETRON HCL 4 MG/2ML IJ SOLN
INTRAMUSCULAR | Status: DC | PRN
  Administered 2023-03-05: 4 mg via INTRAVENOUS

## 2023-03-05 MED ORDER — MIDAZOLAM HCL 2 MG/2ML IJ SOLN
INTRAMUSCULAR | Status: AC
  Filled 2023-03-05: qty 2

## 2023-03-05 MED ORDER — FENTANYL CITRATE (PF) 100 MCG/2ML IJ SOLN
INTRAMUSCULAR | Status: AC
  Filled 2023-03-05: qty 2

## 2023-03-05 MED ORDER — OXYCODONE HCL 5 MG PO TABS
5.0000 mg | ORAL_TABLET | ORAL | 0 refills | Status: DC | PRN
Start: 2023-03-05 — End: 2023-03-19

## 2023-03-05 MED ORDER — DEXMEDETOMIDINE HCL IN NACL 200 MCG/50ML IV SOLN
INTRAVENOUS | Status: DC | PRN
  Administered 2023-03-05 (×3): 8 ug via INTRAVENOUS
  Administered 2023-03-05: 4 ug via INTRAVENOUS

## 2023-03-05 MED ORDER — LIDOCAINE HCL (CARDIAC) PF 100 MG/5ML IV SOSY
PREFILLED_SYRINGE | INTRAVENOUS | Status: DC | PRN
  Administered 2023-03-05: 80 mg via INTRAVENOUS

## 2023-03-05 MED ORDER — CEFAZOLIN SODIUM-DEXTROSE 2-4 GM/100ML-% IV SOLN
INTRAVENOUS | Status: AC
  Filled 2023-03-05: qty 100

## 2023-03-05 MED ORDER — EPHEDRINE SULFATE (PRESSORS) 50 MG/ML IJ SOLN
INTRAMUSCULAR | Status: DC | PRN
  Administered 2023-03-05: 5 mg via INTRAVENOUS

## 2023-03-05 MED ORDER — SUGAMMADEX SODIUM 200 MG/2ML IV SOLN
INTRAVENOUS | Status: DC | PRN
  Administered 2023-03-05: 200 mg via INTRAVENOUS

## 2023-03-05 MED ORDER — PROPOFOL 10 MG/ML IV BOLUS
INTRAVENOUS | Status: DC | PRN
  Administered 2023-03-05: 150 mg via INTRAVENOUS
  Administered 2023-03-05: 30 mg via INTRAVENOUS
  Administered 2023-03-05: 20 mg via INTRAVENOUS
  Administered 2023-03-05: 40 mg via INTRAVENOUS

## 2023-03-05 MED ORDER — OXYCODONE HCL 5 MG PO TABS: 5.0000 mg | ORAL_TABLET | Freq: Once | ORAL | Status: DC | PRN

## 2023-03-05 MED ORDER — GLYCOPYRROLATE 0.2 MG/ML IJ SOLN
INTRAMUSCULAR | Status: DC | PRN
  Administered 2023-03-05: .2 mg via INTRAVENOUS

## 2023-03-05 MED ORDER — HYDROMORPHONE HCL 1 MG/ML IJ SOLN
INTRAMUSCULAR | Status: AC
  Filled 2023-03-05: qty 1

## 2023-03-05 MED ORDER — BUPIVACAINE HCL (PF) 0.5 % IJ SOLN
INTRAMUSCULAR | Status: AC
  Filled 2023-03-05: qty 30

## 2023-03-05 MED ORDER — OXYCODONE HCL 5 MG/5ML PO SOLN: 5.0000 mg | Freq: Once | ORAL | Status: DC | PRN

## 2023-03-05 SURGICAL SUPPLY — 50 items
ADH SKN CLS APL DERMABOND .7 (GAUZE/BANDAGES/DRESSINGS) ×2
BLADE SURG SZ11 CARB STEEL (BLADE) ×2 IMPLANT
COVER TIP SHEARS 8 DVNC (MISCELLANEOUS) ×2 IMPLANT
COVER WAND RF STERILE (DRAPES) ×2 IMPLANT
DERMABOND ADVANCED .7 DNX12 (GAUZE/BANDAGES/DRESSINGS) ×2 IMPLANT
DRAPE ARM DVNC X/XI (DISPOSABLE) ×6 IMPLANT
DRAPE COLUMN DVNC XI (DISPOSABLE) ×2 IMPLANT
ELECT CAUTERY BLADE TIP 2.5 (TIP) ×2
ELECT REM PT RETURN 9FT ADLT (ELECTROSURGICAL) ×2
ELECTRODE CAUTERY BLDE TIP 2.5 (TIP) ×2 IMPLANT
ELECTRODE REM PT RTRN 9FT ADLT (ELECTROSURGICAL) ×2 IMPLANT
FORCEPS BPLR R/ABLATION 8 DVNC (INSTRUMENTS) ×2 IMPLANT
GLOVE SURG SYN 7.0 (GLOVE) ×4 IMPLANT
GLOVE SURG SYN 7.0 PF PI (GLOVE) ×4 IMPLANT
GLOVE SURG SYN 7.5  E (GLOVE) ×6
GLOVE SURG SYN 7.5 E (GLOVE) ×6 IMPLANT
GLOVE SURG SYN 7.5 PF PI (GLOVE) ×4 IMPLANT
GOWN STRL REUS W/ TWL LRG LVL3 (GOWN DISPOSABLE) ×6 IMPLANT
GOWN STRL REUS W/TWL LRG LVL3 (GOWN DISPOSABLE) ×8
GRASPER SUT TROCAR 14GX15 (MISCELLANEOUS) ×2 IMPLANT
KIT PINK PAD W/HEAD ARE REST (MISCELLANEOUS) ×2
KIT PINK PAD W/HEAD ARM REST (MISCELLANEOUS) ×2 IMPLANT
MESH VENT LT ST 15CM CRL ECHO2 (Mesh General) IMPLANT
MESH VENTRALIGHT ST 4.5 ECHO (Mesh General) IMPLANT
NDL DRIVE SUT CUT DVNC (INSTRUMENTS) ×2 IMPLANT
NDL HYPO 22X1.5 SAFETY MO (MISCELLANEOUS) ×2 IMPLANT
NDL INSUFFLATION 14GA 120MM (NEEDLE) ×2 IMPLANT
NEEDLE DRIVE SUT CUT DVNC (INSTRUMENTS) ×2 IMPLANT
NEEDLE HYPO 22X1.5 SAFETY MO (MISCELLANEOUS) ×2 IMPLANT
NEEDLE INSUFFLATION 14GA 120MM (NEEDLE) ×2 IMPLANT
OBTURATOR OPTICAL STND 8 DVNC (TROCAR) ×2
OBTURATOR OPTICALSTD 8 DVNC (TROCAR) ×2 IMPLANT
PACK LAP CHOLECYSTECTOMY (MISCELLANEOUS) ×2 IMPLANT
PENCIL SMOKE EVACUATOR (MISCELLANEOUS) ×2 IMPLANT
SCISSORS MNPLR CVD DVNC XI (INSTRUMENTS) ×2 IMPLANT
SEAL UNIV 5-12 XI (MISCELLANEOUS) ×4 IMPLANT
SET TUBE SMOKE EVAC HIGH FLOW (TUBING) ×2 IMPLANT
SOL ELECTROSURG ANTI STICK (MISCELLANEOUS) ×2
SOLUTION ELECTROSURG ANTI STCK (MISCELLANEOUS) ×2 IMPLANT
SPONGE T-LAP 18X18 ~~LOC~~+RFID (SPONGE) ×2 IMPLANT
SUT MNCRL 4-0 (SUTURE) ×2
SUT MNCRL 4-0 27XMFL (SUTURE) ×2
SUT STRATAFIX PDS 30 CT-1 (SUTURE) ×2 IMPLANT
SUT VIC AB 2-0 SH 27 (SUTURE) ×2
SUT VIC AB 2-0 SH 27XBRD (SUTURE) IMPLANT
SUT VICRYL 0 UR6 27IN ABS (SUTURE) ×4 IMPLANT
SUT VLOC 90 2/L VL 12 GS22 (SUTURE) ×4 IMPLANT
SUTURE MNCRL 4-0 27XMF (SUTURE) ×2 IMPLANT
SYS BAG RETRIEVAL 10MM (BASKET) ×2
SYSTEM BAG RETRIEVAL 10MM (BASKET) IMPLANT

## 2023-03-05 NOTE — Transfer of Care (Signed)
Immediate Anesthesia Transfer of Care Note  Patient: Charles Holder  Procedure(s) Performed: XI ROBOT ASSISTED UMBILICAL HERNIA REPAIR, incarcerated (Abdomen) INSERTION OF MESH (Abdomen)  Patient Location: PACU  Anesthesia Type:General  Level of Consciousness: drowsy  Airway & Oxygen Therapy: Patient Spontanous Breathing and Patient connected to face mask oxygen  Post-op Assessment: Report given to RN and Post -op Vital signs reviewed and stable  Post vital signs: Reviewed  Last Vitals:  Vitals Value Taken Time  BP 165/98 03/05/23 1252  Temp 36.4 C 03/05/23 1248  Pulse 66 03/05/23 1255  Resp 18 03/05/23 1255  SpO2 97 % 03/05/23 1255  Vitals shown include unvalidated device data.  Last Pain:  Vitals:   03/05/23 0932  TempSrc: Temporal  PainSc: 3          Complications: No notable events documented.

## 2023-03-05 NOTE — Interval H&P Note (Signed)
History and Physical Interval Note:  03/05/2023 10:05 AM  Charles Holder  has presented today for surgery, with the diagnosis of incarcerated umbilical hernia less 3 cm.  The various methods of treatment have been discussed with the patient and family. After consideration of risks, benefits and other options for treatment, the patient has consented to  Procedure(s): XI ROBOT ASSISTED UMBILICAL HERNIA REPAIR, incarcerated (N/A) INSERTION OF MESH as a surgical intervention.  The patient's history has been reviewed, patient examined, no change in status, stable for surgery.  I have reviewed the patient's chart and labs.  Questions were answered to the patient's satisfaction.     [name]

## 2023-03-05 NOTE — Op Note (Signed)
  Procedure Date:  03/05/2023  Pre-operative Diagnosis:  Incarcerated umbilical hernia  Post-operative Diagnosis: Incarcerated umbilical hernia, 1.5 cm.  Procedure:  Robotic assisted Incarcerated Hernia Repair with mesh  Surgeon:  Howie Ill, MD  Anesthesia:  General endotracheal  Estimated Blood Loss:  10 ml  Specimens:  None  Complications:  None  Indications for Procedure:  This is a 57 y.o. male who presents with an incarcerated umbilical hernia.  The options of surgery versus observation were reviewed with the patient and/or family. The risks of bleeding, abscess or infection, recurrence of symptoms, potential for an open procedure, injury to surrounding structures, and chronic pain were all discussed with the patient and was willing to proceed.  Description of Procedure: The patient was correctly identified in the preoperative area and brought into the operating room.  The patient was placed supine with VTE prophylaxis in place.  Appropriate time-outs were performed.  Anesthesia was induced and the patient was intubated.  Appropriate antibiotics were infused.  The abdomen was prepped and draped in a sterile fashion. The patient's hernia defect was marked with a marking pen.  A Veress needle was introduced in the left upper quadrant and pneumoperitoneum was obtained with appropriate pressures.  Using Optiview technique, an 8 mm port was introduced in the left lateral abdominal wall without complications.  Then, a 12 mm port was introduced in the left upper quadrant and an 8 mm port in the left lower quadrant under direct visualization.  A 4.5 inch Bard Ventralight ST Echo mesh, a 0 Stratafix suture, and two 2-0 V-loc sutures were inserted through the 12 mm port under direct visualization.  The DaVinci platform was docked, camera targeted, and instruments placed under direct visualization.  The patient's hernia was fully reduced and the peritoneum and preperitoneal fat were  dissected and resected to allow better exposure of the hernia defect and for better mesh placement.  The hernia defect measured 1.5 cm.  The hernia defect was closed using the stratafix suture.  A PMI was brought through the center of the hernia defect and the positioning system of the mesh was passed through and insufflated.  This allowed the mesh to splay open and be centered over the repair site with good overlap.  The mesh was then sutured in place circumferentially and through the center of the mesh using the V-loc sutures.  All needles and the positioning system were then removed through the 12 mm port without complications.  The preperitoneal fat was placed in an endocatch bag.  The DaVinci platform was then undocked and instruments removed.    60 ml of Exparel solution mixed with 0.5% bupivacaine with epi was infiltrated around the mesh edges, hernia repair site, and port sites.  The 12 mm port was removed and the bag retrieved.  The fascia was closed under direct visualization utilizing an Endo Close technique with 0 Vicryl suture.  The 8 mm ports were removed. The 12 mm incision was closed using 3-0 Vicryl and 4-0 Monocryl, and the other port incisions were closed with 4-0 Monocryl.  The wounds were cleaned and sealed with DermaBond.  The patient was emerged from anesthesia and extubated and brought to the recovery room for further management.  The patient tolerated the procedure well and all counts were correct at the end of the case.   Howie Ill, MD

## 2023-03-05 NOTE — Anesthesia Postprocedure Evaluation (Signed)
Anesthesia Post Note  Patient: Charles Holder  Procedure(s) Performed: XI ROBOT ASSISTED UMBILICAL HERNIA REPAIR, incarcerated (Abdomen) INSERTION OF MESH (Abdomen)  Patient location during evaluation: PACU Anesthesia Type: General Level of consciousness: awake and alert Pain management: pain level controlled Vital Signs Assessment: post-procedure vital signs reviewed and stable Respiratory status: spontaneous breathing, nonlabored ventilation, respiratory function stable and patient connected to nasal cannula oxygen Cardiovascular status: blood pressure returned to baseline and stable Postop Assessment: no apparent nausea or vomiting Anesthetic complications: no   No notable events documented.   Last Vitals:  Vitals:   03/05/23 1330 03/05/23 1349  BP: (!) 145/91 (!) 160/91  Pulse: 65 (!) 54  Resp: 14   Temp: 36.7 C (!) 36 C  SpO2: 97% 93%    Last Pain:  Vitals:   03/05/23 1349  TempSrc: Temporal  PainSc: 0-No pain                 Cleda Mccreedy [name]
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
## 2023-03-05 NOTE — Discharge Instructions (Addendum)
Discharge Instructions: 1.  Patient may shower, but do not scrub wounds heavily and dab dry only. 2.  Do not submerge wounds in pool/tub until fully healed. 3.  Do not apply ointments or hydrogen peroxide to the wounds. 4.  May apply ice packs to the wounds for comfort. 5.  Apply neosporin or triple antibiotic to the umbilicus and cover with dry gauze dressing twice daily for 7 days. 6.  Continue taking your Aspirin tomorrow 03/06/23. 7.  May resume your Plavix on 03/07/23. 8.  Do not drive while taking narcotics for pain control.  Prior to driving, make sure you are able to rotate right and left to look at blindspots without significant pain or discomfort. 9.  No heavy lifting or pushing of more than 10-15 lbs for 6 weeks.   AMBULATORY SURGERY  DISCHARGE INSTRUCTIONS   The drugs that you were given will stay in your system until tomorrow so for the next 24 hours you should not:  Drive an automobile Make any legal decisions Drink any alcoholic beverage   You may resume regular meals tomorrow.  Today it is better to start with liquids and gradually work up to solid foods.  You may eat anything you prefer, but it is better to start with liquids, then soup and crackers, and gradually work up to solid foods.   Please notify your doctor immediately if you have any unusual bleeding, trouble breathing, redness and pain at the surgery site, drainage, fever, or pain not relieved by medication.      Additional Instructions:  leave green armband on for 4 days

## 2023-03-05 NOTE — Anesthesia Procedure Notes (Signed)
Procedure Name: Intubation Date/Time: 03/05/2023 10:36 AM  Performed by: Merlene Pulling, CRNAPre-anesthesia Checklist: Patient identified, Patient being monitored, Timeout performed, Emergency Drugs available and Suction available Patient Re-evaluated:Patient Re-evaluated prior to induction Oxygen Delivery Method: Circle system utilized Preoxygenation: Pre-oxygenation with 100% oxygen Induction Type: IV induction Ventilation: Mask ventilation without difficulty Laryngoscope Size: Mac and 4 Grade View: Grade II Tube type: Oral Tube size: 7.5 mm Number of attempts: 1 Airway Equipment and Method: Stylet Placement Confirmation: ETT inserted through vocal cords under direct vision, positive ETCO2 and breath sounds checked- equal and bilateral Secured at: 22 cm Tube secured with: Tape Dental Injury: Teeth and Oropharynx as per pre-operative assessment

## 2023-03-10 ENCOUNTER — Encounter: Payer: Self-pay | Admitting: Surgery

## 2023-03-19 ENCOUNTER — Ambulatory Visit (INDEPENDENT_AMBULATORY_CARE_PROVIDER_SITE_OTHER): Payer: Medicare Other | Admitting: Physician Assistant

## 2023-03-19 ENCOUNTER — Encounter: Payer: Self-pay | Admitting: Physician Assistant

## 2023-03-19 VITALS — BP 137/73 | HR 75 | Temp 98.3°F | Ht 69.0 in | Wt 191.0 lb

## 2023-03-19 DIAGNOSIS — K420 Umbilical hernia with obstruction, without gangrene: Secondary | ICD-10-CM | POA: Diagnosis not present

## 2023-03-19 DIAGNOSIS — Z09 Encounter for follow-up examination after completed treatment for conditions other than malignant neoplasm: Secondary | ICD-10-CM

## 2023-03-19 NOTE — Progress Notes (Signed)
[facility] POST-OP OFFICE VISIT  03/19/2023  HPI: Charles Holder is a 57 y.o. male 14 days s/p robotic assisted laparoscopic incarcerated umbilical hernia with Dr Aleen Campi  He has done very well Pain for bout 4 days; has not needed narcotic pain medication since No fever, chills, nausea, emesis  Tolerating PO; no issues with bowel function Incisions are well healed He does have a known cautery injury at umbilicus; healing No other complaints   Vital signs: BP 137/73   Pulse 75   Temp 98.3 F (36.8 C) (Oral)   Ht 5\' 9"  (1.753 m)   Wt 191 lb (86.6 kg)   SpO2 96%   BMI 28.21 kg/m    Physical Exam: Constitutional: Well appearing male, NAD Abdomen: Soft, non-tender, non-distended, no rebound/guarding Skin: Laparoscopic incisions are healing well, no erythema or drainage. There is a small, <1 cm cautery injury just inferior to the umbilicus, there is a scant amount of fibrinous tissue in the wound bed. No erythema or drainage   Assessment/Plan: This is a 57 y.o. male 14 days s/p robotic assisted laparoscopic incarcerated umbilical hernia with Dr Aleen Campi   - Pain control prn  - Reviewed wound care recommendation  - Reviewed lifting restrictions; 6 weeks total  - He can follow up on as needed basis; He understands to call with questions/concerns  -- Lynden Oxford, PA-C [facility] 03/19/2023, 2:49 PM M-F: 7am - 4pm

## 2023-03-19 NOTE — Patient Instructions (Signed)

## 2023-04-21 ENCOUNTER — Ambulatory Visit: Payer: Medicare Other | Admitting: Urology
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
## 2023-05-05 ENCOUNTER — Encounter: Payer: Self-pay | Admitting: Urology

## 2023-05-05 ENCOUNTER — Ambulatory Visit: Payer: Medicare Other | Admitting: Urology

## 2023-05-05 VITALS — BP 136/82 | HR 54 | Wt 196.0 lb

## 2023-05-05 DIAGNOSIS — B009 Herpesviral infection, unspecified: Secondary | ICD-10-CM | POA: Diagnosis not present

## 2023-05-05 DIAGNOSIS — N401 Enlarged prostate with lower urinary tract symptoms: Secondary | ICD-10-CM | POA: Diagnosis not present

## 2023-05-05 DIAGNOSIS — C61 Malignant neoplasm of prostate: Secondary | ICD-10-CM | POA: Diagnosis not present

## 2023-05-06 LAB — PSA: Prostate Specific Ag, Serum: 4.7 ng/mL — ABNORMAL HIGH (ref 0.0–4.0)

## 2023-06-01 ENCOUNTER — Other Ambulatory Visit: Payer: Self-pay | Admitting: Urology

## 2023-06-01 DIAGNOSIS — C61 Malignant neoplasm of prostate: Secondary | ICD-10-CM

## 2023-06-26 ENCOUNTER — Other Ambulatory Visit: Payer: Self-pay | Admitting: *Deleted

## 2023-06-26 ENCOUNTER — Other Ambulatory Visit: Payer: Self-pay | Admitting: Cardiovascular Disease

## 2023-06-26 ENCOUNTER — Other Ambulatory Visit: Payer: Self-pay | Admitting: Urology

## 2023-06-26 DIAGNOSIS — B009 Herpesviral infection, unspecified: Secondary | ICD-10-CM

## 2023-06-26 DIAGNOSIS — M79602 Pain in left arm: Secondary | ICD-10-CM

## 2023-06-26 MED ORDER — VALACYCLOVIR HCL 1 G PO TABS
1000.0000 mg | ORAL_TABLET | Freq: Every day | ORAL | 3 refills | Status: AC
Start: 2023-06-26 — End: ?

## 2023-07-22 DIAGNOSIS — M9905 Segmental and somatic dysfunction of pelvic region: Secondary | ICD-10-CM | POA: Diagnosis not present

## 2023-07-22 DIAGNOSIS — M9902 Segmental and somatic dysfunction of thoracic region: Secondary | ICD-10-CM | POA: Diagnosis not present

## 2023-07-22 DIAGNOSIS — M9903 Segmental and somatic dysfunction of lumbar region: Secondary | ICD-10-CM | POA: Diagnosis not present

## 2023-07-22 DIAGNOSIS — M5442 Lumbago with sciatica, left side: Secondary | ICD-10-CM | POA: Diagnosis not present

## 2023-07-29 ENCOUNTER — Other Ambulatory Visit: Payer: Self-pay | Admitting: Cardiovascular Disease

## 2023-09-09 ENCOUNTER — Telehealth: Payer: Self-pay | Admitting: Cardiovascular Disease

## 2023-09-09 NOTE — Telephone Encounter (Signed)
Left voicemail to schedule 12 mo follow up appt.

## 2023-09-14 ENCOUNTER — Telehealth: Payer: Self-pay | Admitting: Cardiovascular Disease

## 2023-09-14 ENCOUNTER — Other Ambulatory Visit: Payer: Self-pay | Admitting: Cardiovascular Disease

## 2023-09-14 DIAGNOSIS — M79602 Pain in left arm: Secondary | ICD-10-CM

## 2023-09-14 NOTE — Telephone Encounter (Signed)
Left voicemail, returning call to schedule patient for 12 mo follow up appt

## 2023-09-16 ENCOUNTER — Other Ambulatory Visit: Payer: Self-pay | Admitting: Cardiovascular Disease

## 2023-09-16 DIAGNOSIS — M79602 Pain in left arm: Secondary | ICD-10-CM

## 2023-10-27 DIAGNOSIS — H25013 Cortical age-related cataract, bilateral: Secondary | ICD-10-CM | POA: Diagnosis not present

## 2023-11-15 ENCOUNTER — Ambulatory Visit
Admission: EM | Admit: 2023-11-15 | Discharge: 2023-11-15 | Disposition: A | Discharge status: Home / Self Care | Payer: Medicare Other | Attending: Emergency Medicine | Admitting: Emergency Medicine

## 2023-11-15 ENCOUNTER — Encounter: Payer: Self-pay | Admitting: Emergency Medicine

## 2023-11-15 DIAGNOSIS — J069 Acute upper respiratory infection, unspecified: Secondary | ICD-10-CM | POA: Diagnosis not present

## 2023-11-15 MED ORDER — HYDROCODONE BIT-HOMATROP MBR 5-1.5 MG/5ML PO SOLN: 5.0000 mL | Freq: Four times a day (QID) | ORAL | 0 refills | Status: AC | PRN

## 2023-11-15 MED ORDER — BENZONATATE 100 MG PO CAPS: 100.0000 mg | ORAL_CAPSULE | Freq: Three times a day (TID) | ORAL | 0 refills | Status: AC
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
## 2023-11-15 NOTE — ED Triage Notes (Signed)
Pt presents with a cough, runny nose, bodyaches and fever x 4 days. Pt has tried OTC cold medication for relief.

## 2023-11-15 NOTE — Discharge Instructions (Signed)
Your symptoms today are most likely being caused by a virus and should steadily improve in time it can take up to 7 to 10 days before you truly start to see a turnaround however things will get better  Use Tessalon pill every 8 hours as needed for coughing, may use cough syrup every 6 hours, be mindful this can make you feel sleepy    You can take Tylenol and/or Ibuprofen as needed for fever reduction and pain relief.   For cough: honey 1/2 to 1 teaspoon (you can dilute the honey in water or another fluid).  You can also use guaifenesin and dextromethorphan for cough. You can use a humidifier for chest congestion and cough.  If you don't have a humidifier, you can sit in the bathroom with the hot shower running.      For sore throat: try warm salt water gargles, cepacol lozenges, throat spray, warm tea or water with lemon/honey, popsicles or ice, or OTC cold relief medicine for throat discomfort.   For congestion: take a daily anti-histamine like Zyrtec, Claritin, and a oral decongestant, such as pseudoephedrine.  You can also use Flonase 1-2 sprays in each nostril daily.   It is important to stay hydrated: drink plenty of fluids (water, gatorade/powerade/pedialyte, juices, or teas) to keep your throat moisturized and help further relieve irritation/discomfort.
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
## 2023-11-16 ENCOUNTER — Ambulatory Visit: Payer: Medicare Other | Admitting: Cardiovascular Disease

## 2023-11-16 DIAGNOSIS — I10 Essential (primary) hypertension: Secondary | ICD-10-CM

## 2023-11-16 DIAGNOSIS — E7800 Pure hypercholesterolemia, unspecified: Secondary | ICD-10-CM

## 2023-11-16 DIAGNOSIS — I739 Peripheral vascular disease, unspecified: Secondary | ICD-10-CM

## 2023-11-16 DIAGNOSIS — I70213 Atherosclerosis of native arteries of extremities with intermittent claudication, bilateral legs: Secondary | ICD-10-CM

## 2023-11-16 DIAGNOSIS — M79602 Pain in left arm: Secondary | ICD-10-CM

## 2023-11-16 DIAGNOSIS — F17200 Nicotine dependence, unspecified, uncomplicated: Secondary | ICD-10-CM

## 2023-11-16 DIAGNOSIS — I7409 Other arterial embolism and thrombosis of abdominal aorta: Secondary | ICD-10-CM

## 2023-11-17 ENCOUNTER — Ambulatory Visit (INDEPENDENT_AMBULATORY_CARE_PROVIDER_SITE_OTHER)
Admission: RE | Admit: 2023-11-17 | Discharge: 2023-11-17 | Disposition: A | Discharge status: Home / Self Care | Payer: Medicare Other | Source: Ambulatory Visit | Attending: Family Medicine | Admitting: Family Medicine

## 2023-11-17 ENCOUNTER — Encounter: Payer: Self-pay | Admitting: Family Medicine

## 2023-11-17 ENCOUNTER — Ambulatory Visit: Payer: Medicare Other | Admitting: Family Medicine

## 2023-11-17 VITALS — BP 141/88 | HR 71 | Temp 98.2°F | Ht 69.0 in | Wt 187.4 lb

## 2023-11-17 DIAGNOSIS — J069 Acute upper respiratory infection, unspecified: Secondary | ICD-10-CM | POA: Diagnosis not present

## 2023-11-17 DIAGNOSIS — F17200 Nicotine dependence, unspecified, uncomplicated: Secondary | ICD-10-CM

## 2023-11-17 DIAGNOSIS — R059 Cough, unspecified: Secondary | ICD-10-CM | POA: Diagnosis not present

## 2023-11-17 DIAGNOSIS — R079 Chest pain, unspecified: Secondary | ICD-10-CM | POA: Diagnosis not present

## 2023-11-17 DIAGNOSIS — I25118 Atherosclerotic heart disease of native coronary artery with other forms of angina pectoris: Secondary | ICD-10-CM

## 2023-11-17 DIAGNOSIS — I10 Essential (primary) hypertension: Secondary | ICD-10-CM | POA: Diagnosis not present

## 2023-11-17 DIAGNOSIS — R0789 Other chest pain: Secondary | ICD-10-CM | POA: Insufficient documentation

## 2023-11-17 DIAGNOSIS — I7409 Other arterial embolism and thrombosis of abdominal aorta: Secondary | ICD-10-CM | POA: Insufficient documentation

## 2023-11-17 MED ORDER — NITROGLYCERIN 0.4 MG SL SUBL: 0.4000 mg | SUBLINGUAL_TABLET | SUBLINGUAL | 0 refills | Status: DC | PRN

## 2023-11-17 NOTE — Assessment & Plan Note (Signed)
BP: (!) 141/88  Pt did not take blood pressure med today   Will take it when he gets home  Planning cardiology follow up  Amlodipine 5 mg daily
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
## 2023-11-17 NOTE — Assessment & Plan Note (Addendum)
 Reviewed UC notes 2/2  Improved clinically -did not end up getting the hycodan or tessalon   Now some left anterior cw pain -likely from cough   Cxr today reassuring /no infiltrate  Instructed not to take more asa or nsaid (tylenol  only) in light of his plavix  and 81 mg asa daily  See AVS for symptoms care  Aware sudafed will increase blood pressure temporarily   Update if not starting to improve in a week or if worsening  Call back and Er precautions noted in detail today

## 2023-11-17 NOTE — Assessment & Plan Note (Signed)
Continues cholesterol control/statin and blood pressure control  Follow up with cardiology in April  Encouraged strongly to quit smoking

## 2023-11-17 NOTE — Patient Instructions (Addendum)
 The best time to quit smoking is when you are sick   No more aspirin  then your daily 81 mg Also avoid nsaids like ibuprofen and aleve   Tylenol  is ok   Chest xray today  Treat your symptoms   Update if not starting to improve in a week or if worsening    If any severe symptoms like trouble breathing- go to the hospital

## 2023-11-17 NOTE — Assessment & Plan Note (Addendum)
Left anterior - likely due to cough with uri  Cxr :clear Suspect to to strain from cough Warm compresses may help   Call back and Er precautions noted in detail today

## 2023-11-17 NOTE — Assessment & Plan Note (Signed)
Reviewed last urology note  Prostate cancer -PSA pending -MRI prostate MRI from January 2024 - PI-RADS 3 lesion is stable  Doing well clinically

## 2023-11-17 NOTE — Assessment & Plan Note (Signed)
Continues blood pressure control/ chol control and plavix and asa  Planning cardiology follow up in april

## 2023-11-17 NOTE — Assessment & Plan Note (Signed)
Due for cardiology follow up- had to re sched for April since he is sick Asa 81 mg  Plavix 75 mg daily   Instructed not to take more asa or nsaids with this   Refilled nitroglycerin for short term while waiting for appointment

## 2023-11-17 NOTE — Assessment & Plan Note (Signed)
 Disc in detail risks of smoking and possible outcomes including copd, vascular/ heart disease, cancer , respiratory and sinus infections as well as osteoporosis  Pt voices understanding Pt is not ready to quit   Encouraged that it is a good time to cut back / when sick with uri

## 2023-12-17 ENCOUNTER — Other Ambulatory Visit: Payer: Self-pay | Admitting: Family Medicine

## 2023-12-18 NOTE — Telephone Encounter (Signed)
 Last filled on 11/17/23 #10 tabs/ 0 refills, last OV was an acute appt on 11/17/23  Pt does have a cardiology f/u on 01/25/24

## 2023-12-21 DIAGNOSIS — M9901 Segmental and somatic dysfunction of cervical region: Secondary | ICD-10-CM | POA: Diagnosis not present

## 2023-12-21 DIAGNOSIS — M5412 Radiculopathy, cervical region: Secondary | ICD-10-CM | POA: Diagnosis not present

## 2023-12-21 DIAGNOSIS — M9903 Segmental and somatic dysfunction of lumbar region: Secondary | ICD-10-CM | POA: Diagnosis not present

## 2023-12-21 DIAGNOSIS — M5416 Radiculopathy, lumbar region: Secondary | ICD-10-CM | POA: Diagnosis not present

## 2023-12-21 DIAGNOSIS — M546 Pain in thoracic spine: Secondary | ICD-10-CM | POA: Diagnosis not present

## 2023-12-21 DIAGNOSIS — M9902 Segmental and somatic dysfunction of thoracic region: Secondary | ICD-10-CM | POA: Diagnosis not present
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
## 2024-01-25 ENCOUNTER — Ambulatory Visit: Payer: Medicare Other | Attending: Cardiovascular Disease | Admitting: Cardiovascular Disease

## 2024-01-25 ENCOUNTER — Encounter: Payer: Self-pay | Admitting: Cardiovascular Disease

## 2024-01-25 VITALS — BP 90/60 | HR 62 | Ht 69.0 in | Wt 192.2 lb

## 2024-01-25 DIAGNOSIS — I25118 Atherosclerotic heart disease of native coronary artery with other forms of angina pectoris: Secondary | ICD-10-CM | POA: Diagnosis not present

## 2024-01-25 DIAGNOSIS — I7409 Other arterial embolism and thrombosis of abdominal aorta: Secondary | ICD-10-CM | POA: Diagnosis not present

## 2024-01-25 DIAGNOSIS — E7800 Pure hypercholesterolemia, unspecified: Secondary | ICD-10-CM

## 2024-01-25 DIAGNOSIS — I70213 Atherosclerosis of native arteries of extremities with intermittent claudication, bilateral legs: Secondary | ICD-10-CM | POA: Diagnosis not present

## 2024-01-25 DIAGNOSIS — I10 Essential (primary) hypertension: Secondary | ICD-10-CM

## 2024-01-25 DIAGNOSIS — E782 Mixed hyperlipidemia: Secondary | ICD-10-CM | POA: Diagnosis not present

## 2024-01-25 DIAGNOSIS — I739 Peripheral vascular disease, unspecified: Secondary | ICD-10-CM

## 2024-01-25 DIAGNOSIS — F17200 Nicotine dependence, unspecified, uncomplicated: Secondary | ICD-10-CM

## 2024-01-25 NOTE — Patient Instructions (Signed)
 Medication Instructions:  Hold amlodipine as needed for low pressure  If you need a refill on your cardiac medications before your next appointment, please call your pharmacy.   Lab work: No new labs needed  Testing/Procedures: No new testing needed  Follow-Up: At [facility], you and your health needs are our priority.  As part of our continuing mission to provide you with exceptional heart care, we have created designated Provider Care Teams.  These Care Teams include your primary Cardiologist (physician) and Advanced Practice Providers (APPs -  Physician Assistants and Nurse Practitioners) who all work together to provide you with the care you need, when you need it.  You will need a follow up appointment in 12 months  Providers on your designated Care Team:   Laneta Pintos, NP Varney Gentleman, PA-C Cadence Gennaro Khat, [place]  COVID-19 Vaccine Information can be found at: [email or web address] For questions related to vaccine distribution or appointments, please email [email or web address] or call [phone].

## 2024-02-12 ENCOUNTER — Other Ambulatory Visit: Payer: Self-pay | Admitting: Cardiovascular Disease

## 2024-03-06 IMAGING — DX DG CHEST 2V
2 series · 2 of 2 positions shown · non-contrast
Comparison: April 10, 2014

CLINICAL DATA: Smoker with cough.

EXAM:
CHEST - 2 VIEW

[chest pa]
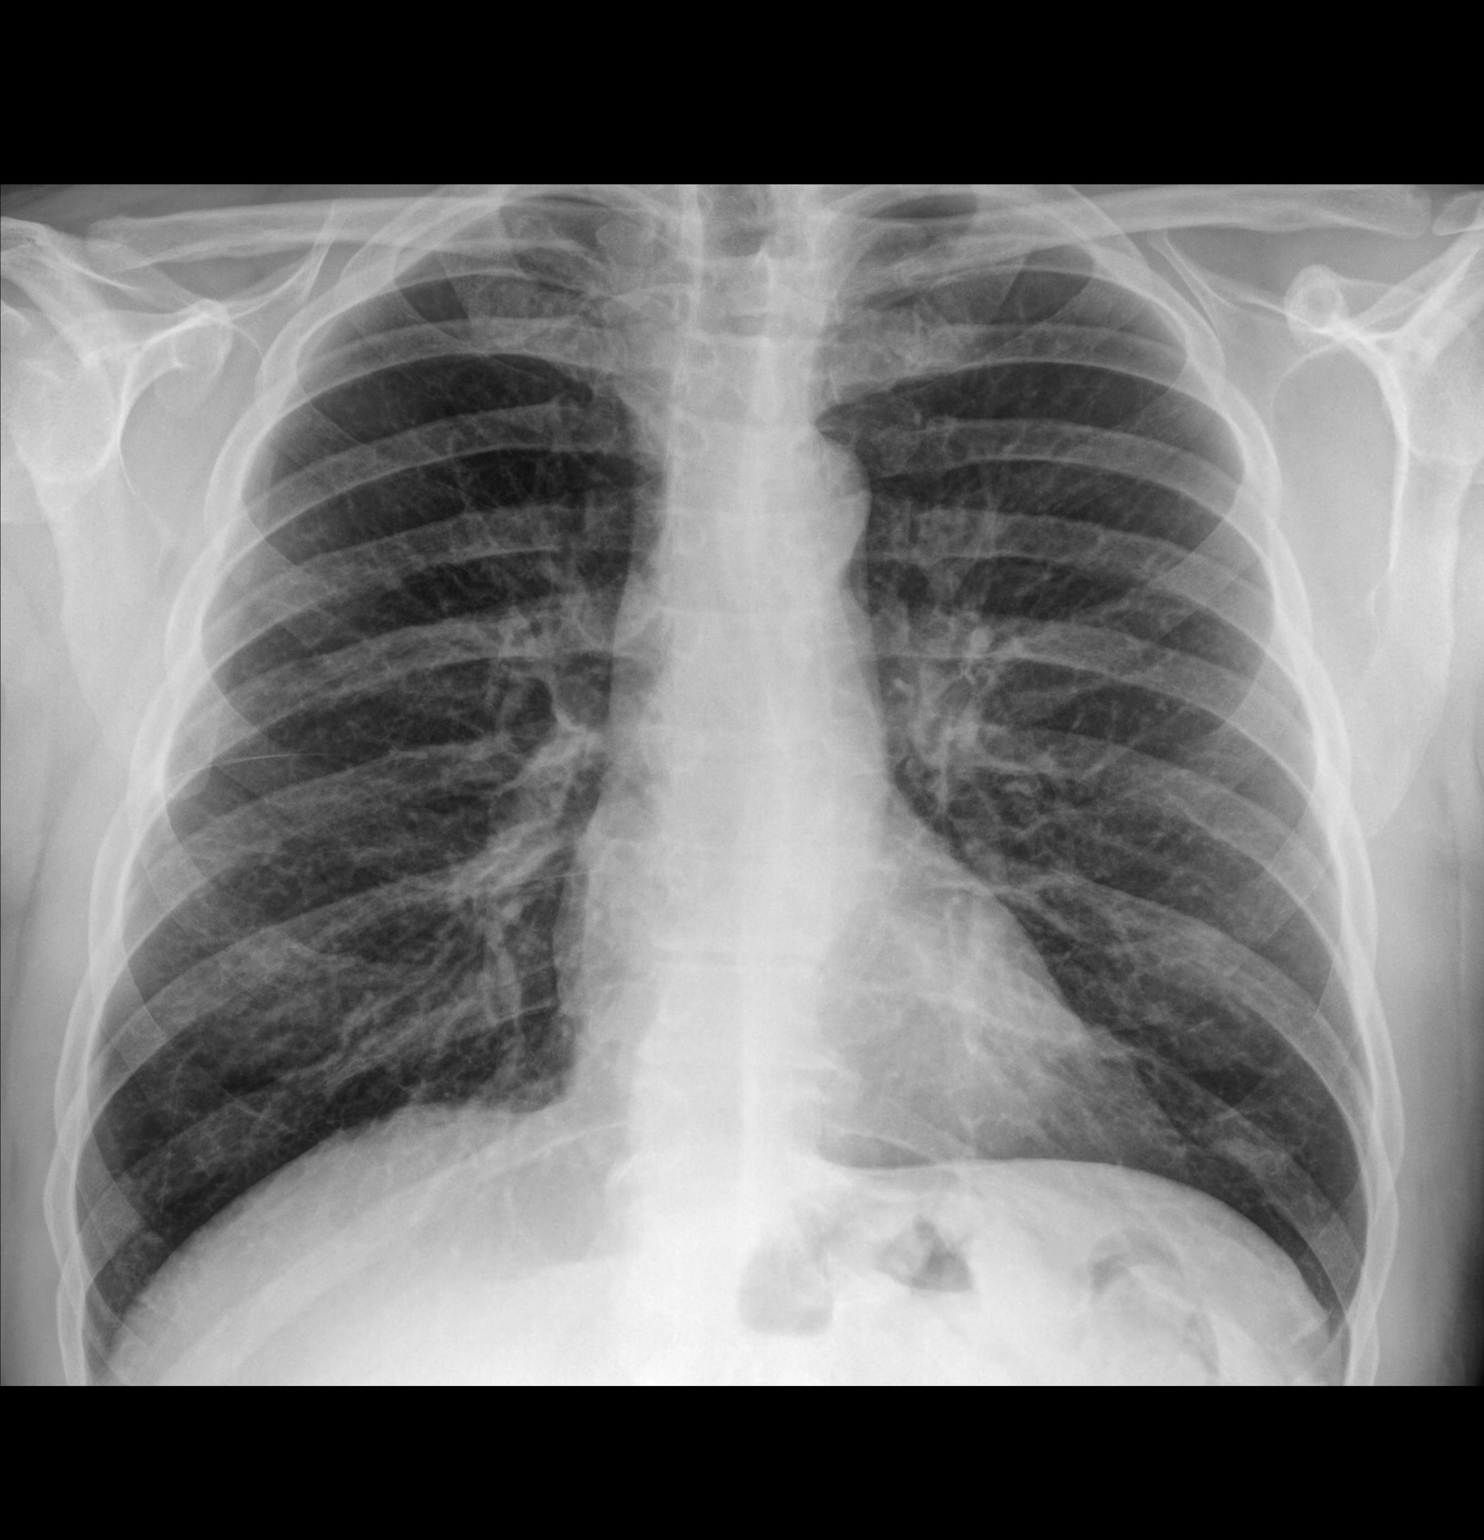

[chest lat]
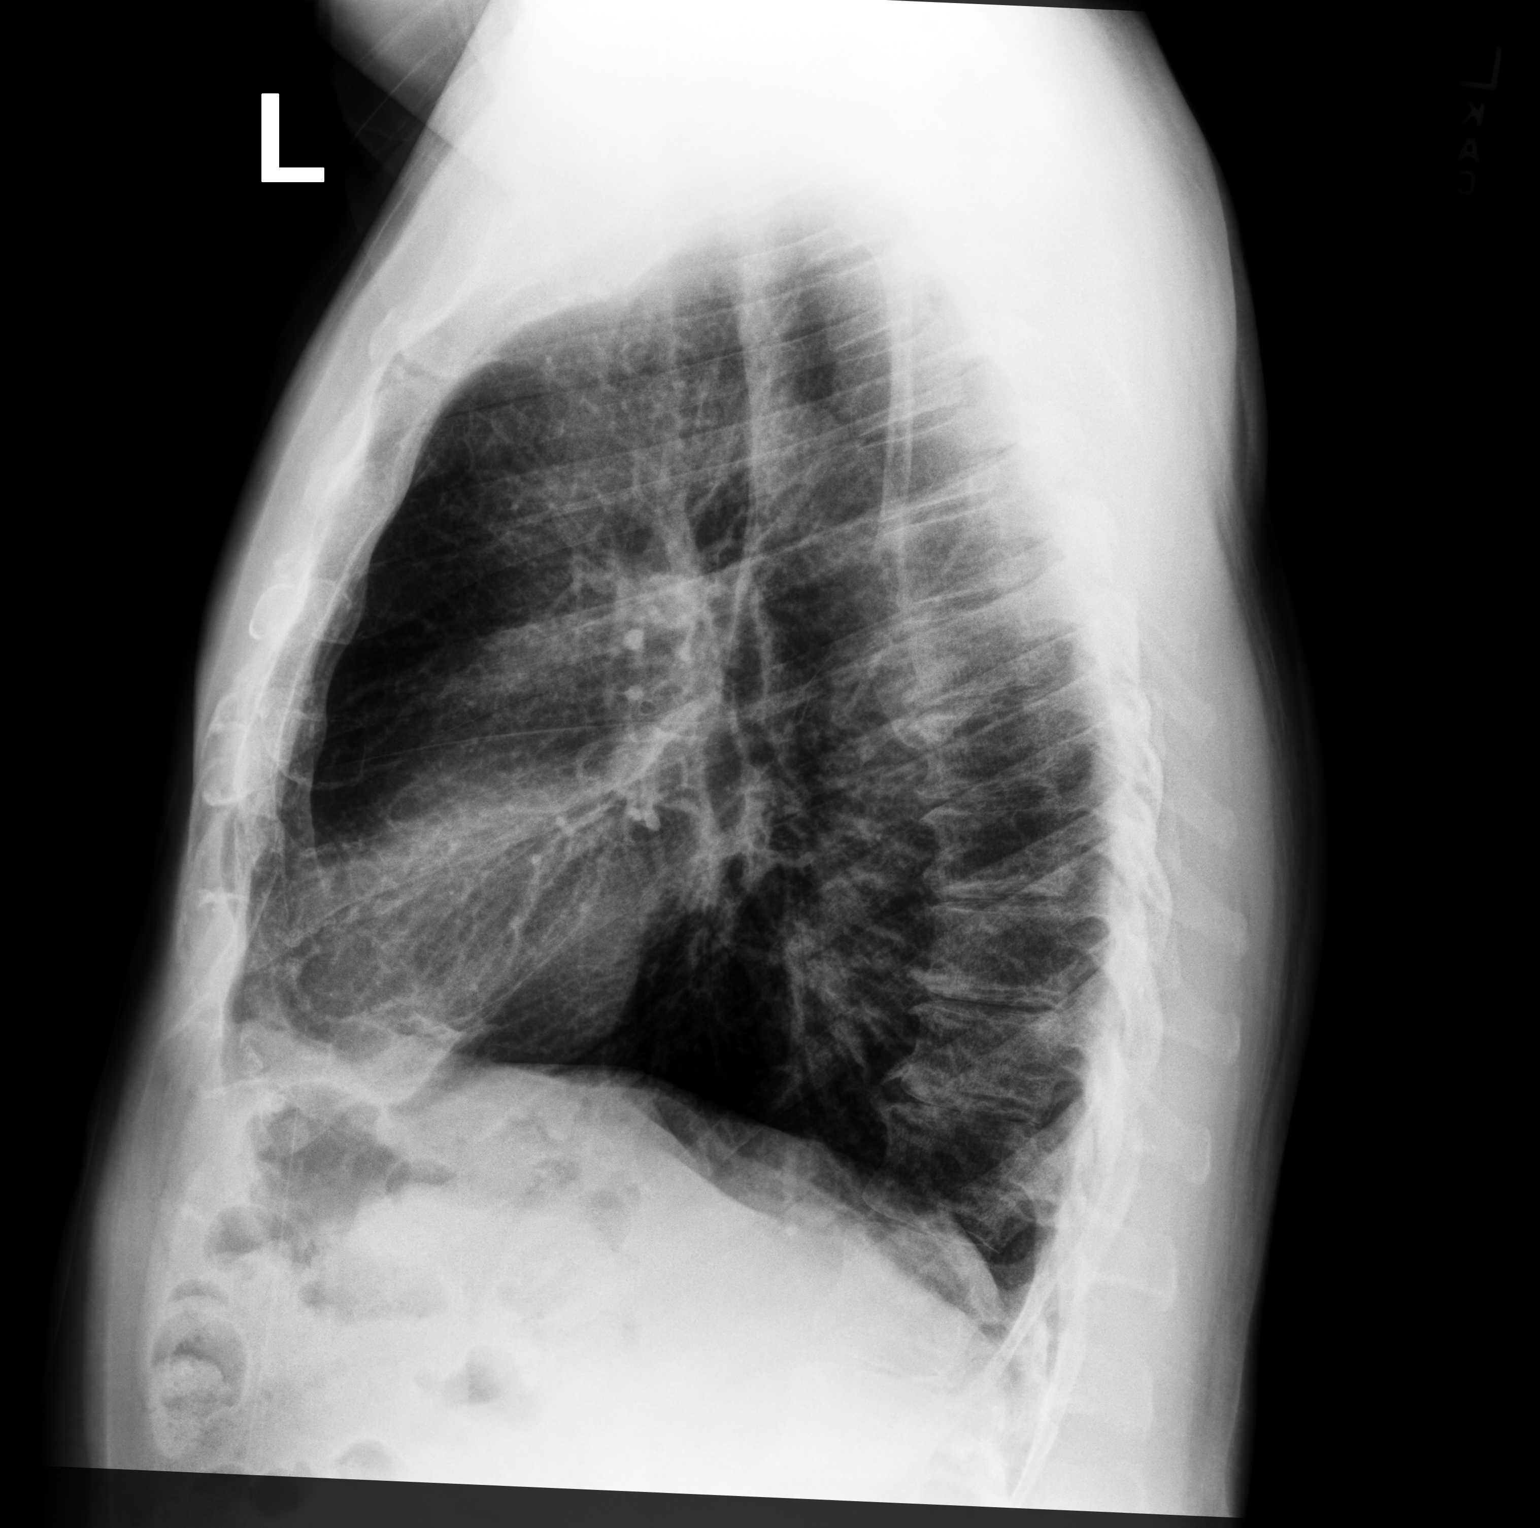

[2 of 2 positions shown; findings below may reference images not displayed]

FINDINGS: The heart size and mediastinal contours are within normal limits.
Both lungs are clear. Degenerative joint changes of spine are noted.
IMPRESSION: No active cardiopulmonary disease.

## 2024-03-22 IMAGING — CT CT ANGIO AOBIFEM WO/W CM
1 of 15 series · 12 of 48 positions shown, 16 images · IV contrast (APPLIED)
Comparison: None Available.

CLINICAL DATA: Peripheral arterial disease

EXAM:
CT ANGIOGRAPHY OF ABDOMINAL AORTA WITH ILIOFEMORAL RUNOFF
TECHNIQUE: Multidetector CT imaging of the abdomen, pelvis and lower
extremities was performed using the standard protocol during bolus
administration of intravenous contrast. Multiplanar CT image
reconstructions and MIPs were obtained to evaluate the vascular
anatomy.

[Series 4: axial arterial upper (person_name) · axial · arterial · 0.98mm/px · z∈[-1670,-431]mm · 12 of 463 slices shown, 16 images]
[im 25/463  soft-tissue]
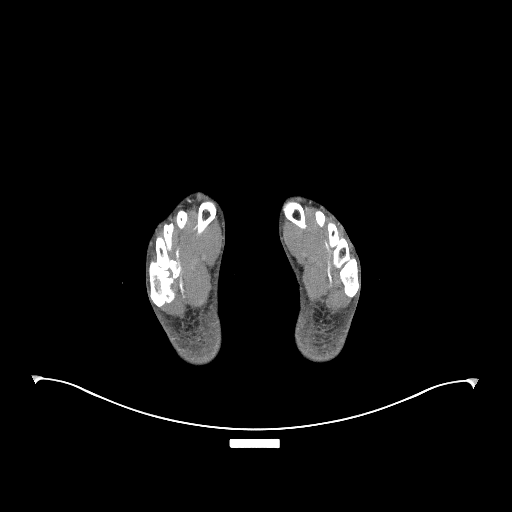
[im 25/463  bone]
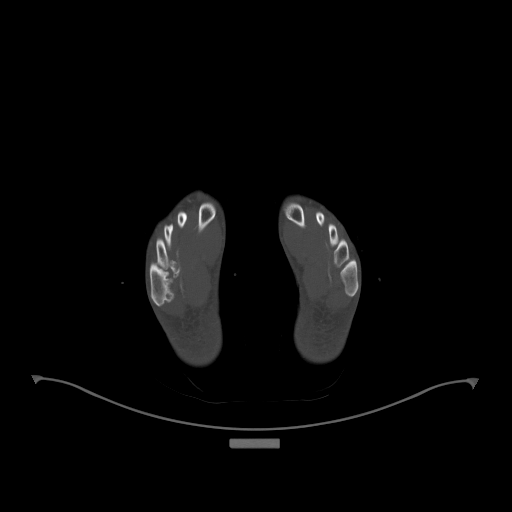
[im 73/463  soft-tissue]
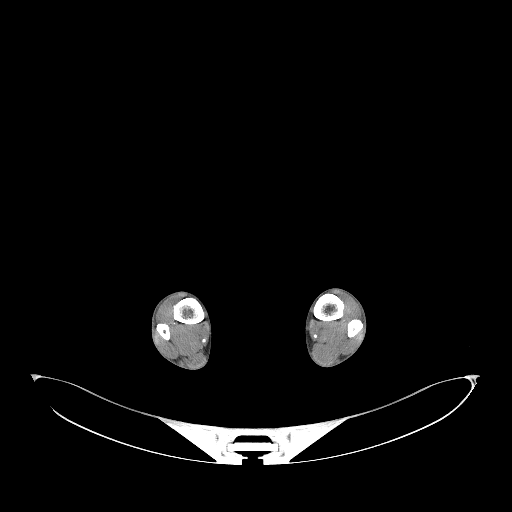
[im 122/463  soft-tissue]
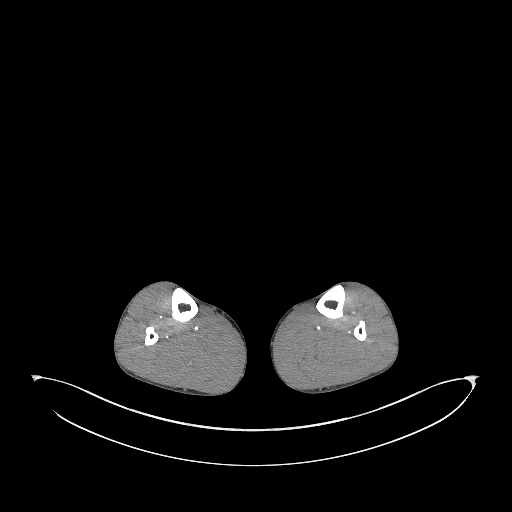
[im 171/463  soft-tissue]
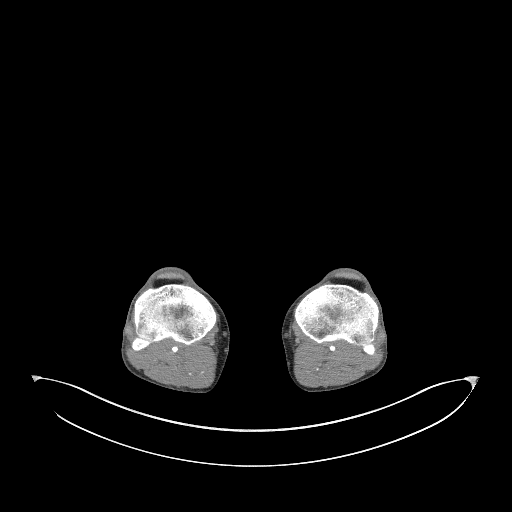
[im 219/463  soft-tissue]
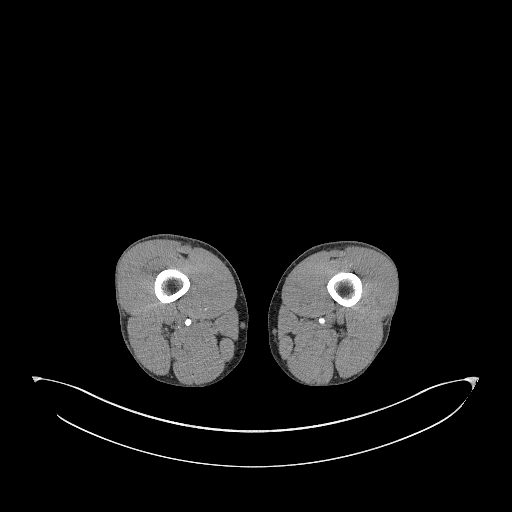
[im 244/463  soft-tissue]
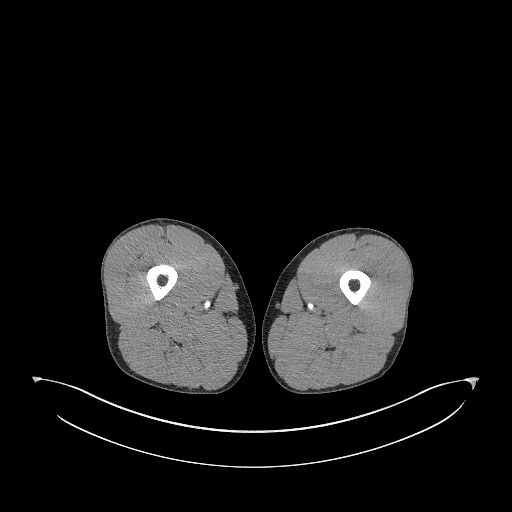
[im 292/463  soft-tissue]
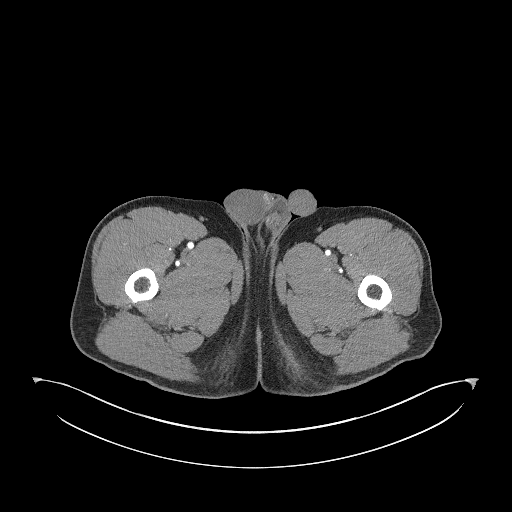
[im 341/463  soft-tissue]
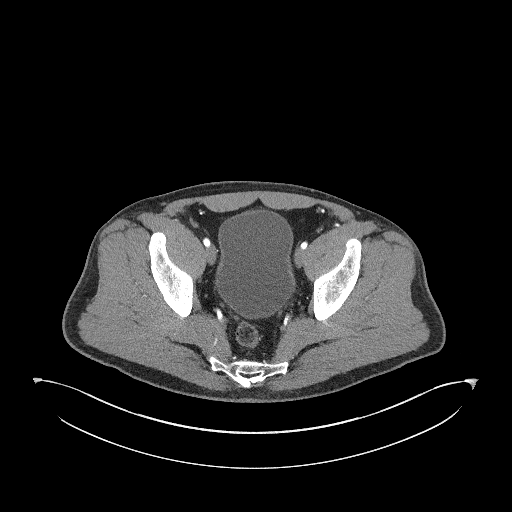
[im 365/463  lung]
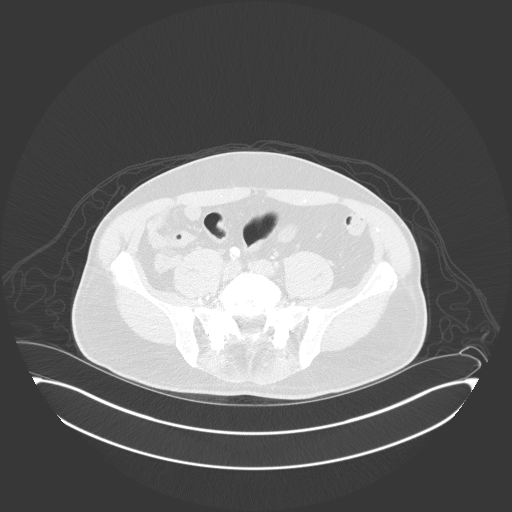
[im 390/463  soft-tissue]
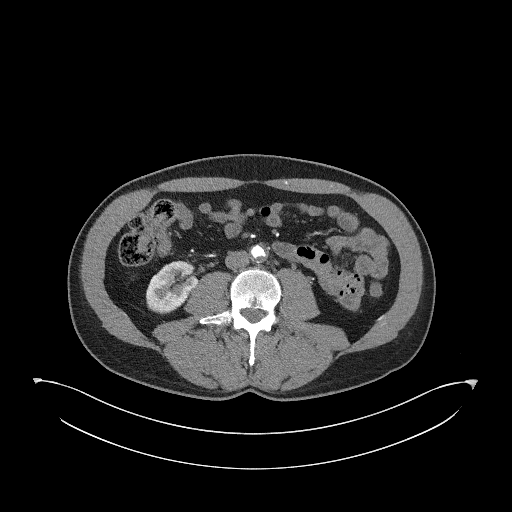
[im 390/463  lung]
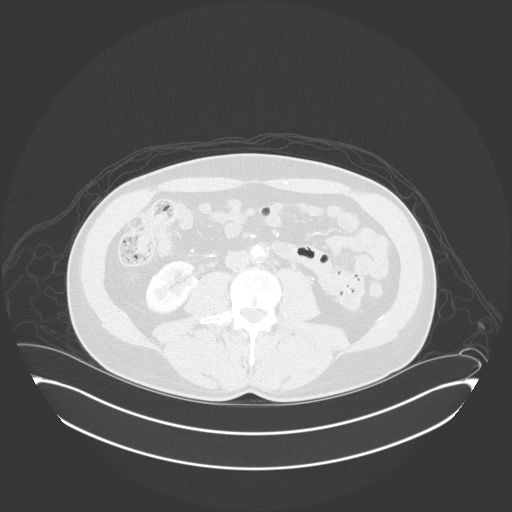
[im 390/463  bone]
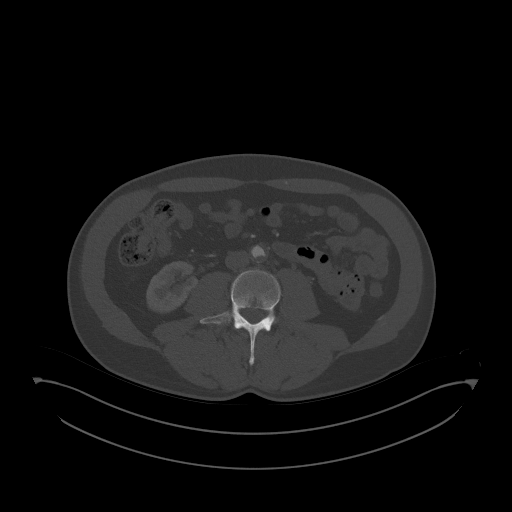
[im 414/463  lung]
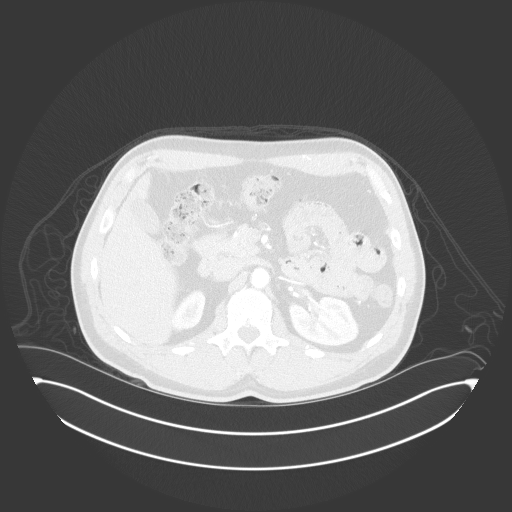
[im 438/463  soft-tissue]
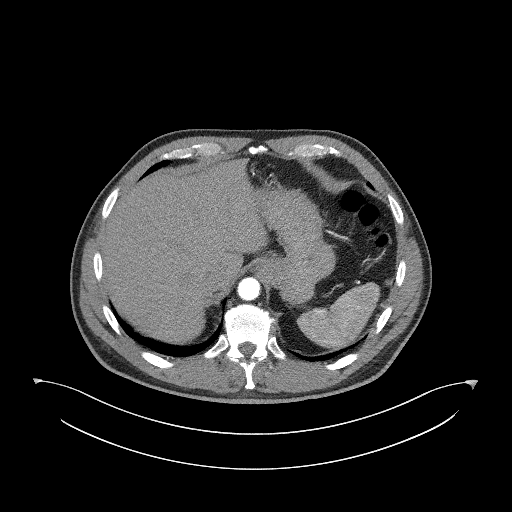
[im 438/463  lung]
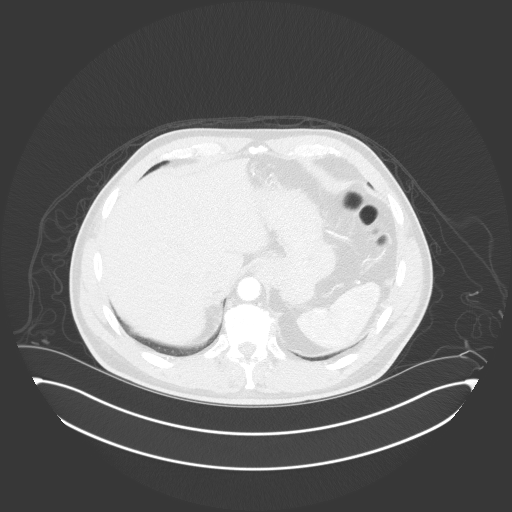

[12 of 48 positions shown; findings below may reference images not displayed]

RADIATION DOSE REDUCTION: This exam was performed according to the
departmental dose-optimization program which includes automated
exposure control, adjustment of the mA and/or kV according to
patient size and/or use of iterative reconstruction technique.

CONTRAST:  100mL OMNIPAQUE IOHEXOL 350 MG/ML SOLN
FINDINGS: VASCULAR

Aorta: Extensive heterogeneous and irregular atherosclerotic plaque
throughout the infrarenal abdominal aorta. Focal penetrating
ulceration posteriorly in the distal most infrarenal aorta. No
aneurysm or dissection.

Celiac: Patent without evidence of aneurysm, dissection, vasculitis
or significant stenosis.

SMA: Patent without evidence of aneurysm, dissection, vasculitis or
significant stenosis.

Renals: There are 4 right-sided renal arteries, 2 arising from the
normal anatomic location, and 2 arising from the distal most
abdominal aorta. At least mild narrowing of the superior most of the
for renal arteries. The 2 most inferior renal arteries are too small
to evaluate. On the left, there are 3 renal arteries with mild to
moderate stenosis at the origins of all 3.

IMA: Diffusely diseased and small in caliber. Likely high-grade
stenosis at the origin.

RIGHT Lower Extremity

Inflow: Scattered atherosclerotic plaque. No aneurysm or dissection.
Moderate focal stenosis of the external iliac artery distally.

Outflow: High bifurcation of the common femoral artery. Scattered
atherosclerotic plaque without high-grade stenosis or occlusion in
the superficial femoral artery. The profunda femoral and popliteal
arteries are patent and unremarkable.

Runoff: Patent three vessel runoff to the ankle.

LEFT Lower Extremity

Inflow: Chronic total occlusion of the left common iliac artery just
beyond the origin. The vessel is very small in caliber consistent
with longstanding occlusive disease. There is reconstitution at the
bifurcation. The internal and external iliac arteries are patent via
retrograde flow.

Outflow: The common femoral artery is patent with only mild plaque.
The profunda femoral artery is widely patent. The superficial
femoral and popliteal arteries are widely patent.

Runoff: Patent 3 vessel runoff to the ankle.

Veins: No focal venous abnormality.

Review of the MIP images confirms the above findings.

NON-VASCULAR

Lower chest: No acute abnormality. Diffuse mild bronchial wall
thickening. A stent is present within the circumflex coronary
artery.

Hepatobiliary: No focal liver abnormality is seen. No gallstones,
gallbladder wall thickening, or biliary dilatation.

Pancreas: Unremarkable. No pancreatic ductal dilatation or
surrounding inflammatory changes.

Spleen: Normal in size without focal abnormality.

Adrenals/Urinary Tract: Adrenal glands are unremarkable. Kidneys are
normal, without renal calculi, focal lesion, or hydronephrosis.
Bladder is unremarkable.

Stomach/Bowel: Stomach is within normal limits. Appendix appears
normal. No evidence of bowel wall thickening, distention, or
inflammatory changes.

Lymphatic: No suspicious lymphadenopathy.

Reproductive: Prostate is unremarkable.

Other: Fat containing umbilical hernia.  No ascites.

Musculoskeletal: Surgical changes of prior L5-S1 posterior lumbar
interbody fusion with interbody graft in place and successful
ankylosis. No hardware complication. No acute fracture or
malalignment.
IMPRESSION: VASCULAR

1. Chronic total occlusion of the left common iliac artery beginning
just beyond the origin with reconstitution at the bifurcation.
2. Extensive heterogeneous and irregular/ulcerated fibrofatty
atherosclerotic plaque throughout the infrarenal abdominal aorta.
Small posterior penetrating atherosclerotic ulcer at the distal most
aorta. Aortic Atherosclerosis ([id number]).
3. Multiplicity of renal arteries bilaterally with multifocal mild
to moderate stenoses.
4. Diseased small caliber inferior mesenteric artery.

NON-VASCULAR

1. No acute abnormality within the abdomen or pelvis.
2. Fat containing umbilical hernia.
3. Diffuse bronchial wall thickening suggests chronic bronchitis.
4. Additional ancillary findings as above.

## 2024-04-12 ENCOUNTER — Other Ambulatory Visit: Payer: Self-pay | Admitting: Cardiovascular Disease

## 2024-04-12 DIAGNOSIS — M79602 Pain in left arm: Secondary | ICD-10-CM

## 2024-05-17 ENCOUNTER — Other Ambulatory Visit: Payer: Self-pay | Admitting: Cardiovascular Disease

## 2024-06-24 DIAGNOSIS — M546 Pain in thoracic spine: Secondary | ICD-10-CM | POA: Diagnosis not present

## 2024-06-24 DIAGNOSIS — M5412 Radiculopathy, cervical region: Secondary | ICD-10-CM | POA: Diagnosis not present

## 2024-06-24 DIAGNOSIS — M9902 Segmental and somatic dysfunction of thoracic region: Secondary | ICD-10-CM | POA: Diagnosis not present

## 2024-06-24 DIAGNOSIS — M9901 Segmental and somatic dysfunction of cervical region: Secondary | ICD-10-CM | POA: Diagnosis not present

## 2024-06-27 DIAGNOSIS — M546 Pain in thoracic spine: Secondary | ICD-10-CM | POA: Diagnosis not present

## 2024-06-27 DIAGNOSIS — M9902 Segmental and somatic dysfunction of thoracic region: Secondary | ICD-10-CM | POA: Diagnosis not present

## 2024-06-27 DIAGNOSIS — M5412 Radiculopathy, cervical region: Secondary | ICD-10-CM | POA: Diagnosis not present

## 2024-06-27 DIAGNOSIS — M9901 Segmental and somatic dysfunction of cervical region: Secondary | ICD-10-CM | POA: Diagnosis not present

## 2024-07-20 DIAGNOSIS — M9902 Segmental and somatic dysfunction of thoracic region: Secondary | ICD-10-CM | POA: Diagnosis not present

## 2024-07-20 DIAGNOSIS — M9901 Segmental and somatic dysfunction of cervical region: Secondary | ICD-10-CM | POA: Diagnosis not present

## 2024-07-20 DIAGNOSIS — M5412 Radiculopathy, cervical region: Secondary | ICD-10-CM | POA: Diagnosis not present

## 2024-07-20 DIAGNOSIS — M546 Pain in thoracic spine: Secondary | ICD-10-CM | POA: Diagnosis not present
